# Patient Record
Sex: Female | Born: 1940 | Race: White | Hispanic: No | Marital: Married | Smoking: Never smoker
Health system: Southern US, Community
[De-identification: ages and names within clinical notes are randomized; demographics above are authoritative.]

## PROBLEM LIST (undated history)

## (undated) DIAGNOSIS — E079 Disorder of thyroid, unspecified: Secondary | ICD-10-CM

## (undated) DIAGNOSIS — C50919 Malignant neoplasm of unspecified site of unspecified female breast: Secondary | ICD-10-CM

## (undated) DIAGNOSIS — E119 Type 2 diabetes mellitus without complications: Secondary | ICD-10-CM

## (undated) DIAGNOSIS — I10 Essential (primary) hypertension: Secondary | ICD-10-CM

## (undated) DIAGNOSIS — M810 Age-related osteoporosis without current pathological fracture: Secondary | ICD-10-CM

## (undated) DIAGNOSIS — E039 Hypothyroidism, unspecified: Secondary | ICD-10-CM

## (undated) DIAGNOSIS — E7800 Pure hypercholesterolemia, unspecified: Secondary | ICD-10-CM

## (undated) DIAGNOSIS — C801 Malignant (primary) neoplasm, unspecified: Secondary | ICD-10-CM

## (undated) HISTORY — PX: TONSILLECTOMY: SUR1361

## (undated) HISTORY — DX: Pure hypercholesterolemia, unspecified: E78.00

## (undated) HISTORY — DX: Age-related osteoporosis without current pathological fracture: M81.0

## (undated) HISTORY — DX: Type 2 diabetes mellitus without complications: E11.9

## (undated) HISTORY — PX: TOTAL ABDOMINAL HYSTERECTOMY: SHX209

## (undated) HISTORY — DX: Essential (primary) hypertension: I10

## (undated) HISTORY — DX: Disorder of thyroid, unspecified: E07.9

---

## 2006-09-19 ENCOUNTER — Other Ambulatory Visit: Payer: Self-pay

## 2006-09-19 ENCOUNTER — Emergency Department: Payer: Self-pay | Admitting: Emergency Medicine

## 2007-02-19 ENCOUNTER — Emergency Department: Payer: Self-pay | Admitting: Emergency Medicine

## 2007-02-19 ENCOUNTER — Other Ambulatory Visit: Payer: Self-pay

## 2007-11-05 ENCOUNTER — Emergency Department: Payer: Self-pay | Admitting: Emergency Medicine

## 2007-11-05 ENCOUNTER — Other Ambulatory Visit: Payer: Self-pay

## 2009-02-15 ENCOUNTER — Emergency Department: Payer: Self-pay | Admitting: Emergency Medicine

## 2009-03-11 ENCOUNTER — Emergency Department: Payer: Self-pay | Admitting: Emergency Medicine

## 2009-06-08 ENCOUNTER — Emergency Department: Payer: Self-pay | Admitting: Emergency Medicine

## 2009-11-19 DIAGNOSIS — C801 Malignant (primary) neoplasm, unspecified: Secondary | ICD-10-CM

## 2009-11-19 DIAGNOSIS — C50919 Malignant neoplasm of unspecified site of unspecified female breast: Secondary | ICD-10-CM

## 2009-11-19 HISTORY — PX: MASTECTOMY: SHX3

## 2009-11-19 HISTORY — DX: Malignant neoplasm of unspecified site of unspecified female breast: C50.919

## 2009-11-19 HISTORY — DX: Malignant (primary) neoplasm, unspecified: C80.1

## 2009-11-19 HISTORY — PX: BREAST BIOPSY: SHX20

## 2010-04-04 ENCOUNTER — Ambulatory Visit: Payer: Self-pay | Admitting: Internal Medicine

## 2010-04-19 ENCOUNTER — Ambulatory Visit: Payer: Self-pay | Admitting: Internal Medicine

## 2010-04-26 ENCOUNTER — Ambulatory Visit: Payer: Self-pay | Admitting: Internal Medicine

## 2010-05-01 ENCOUNTER — Ambulatory Visit: Payer: Self-pay | Admitting: Internal Medicine

## 2010-05-19 ENCOUNTER — Ambulatory Visit: Payer: Self-pay | Admitting: Internal Medicine

## 2010-05-25 ENCOUNTER — Ambulatory Visit: Payer: Self-pay | Admitting: Surgery

## 2010-06-01 ENCOUNTER — Inpatient Hospital Stay: Payer: Self-pay | Admitting: Surgery

## 2010-06-30 ENCOUNTER — Ambulatory Visit: Payer: Self-pay | Admitting: Internal Medicine

## 2010-07-09 LAB — CANCER ANTIGEN 27.29: CA 27.29: 15.9 U/mL (ref 0.0–38.6)

## 2010-07-20 ENCOUNTER — Ambulatory Visit: Payer: Self-pay | Admitting: Internal Medicine

## 2010-11-14 ENCOUNTER — Ambulatory Visit: Payer: Self-pay | Admitting: Internal Medicine

## 2010-11-19 ENCOUNTER — Ambulatory Visit: Payer: Self-pay | Admitting: Internal Medicine

## 2011-03-20 ENCOUNTER — Ambulatory Visit: Payer: Self-pay | Admitting: Internal Medicine

## 2011-03-21 LAB — CANCER ANTIGEN 27.29: CA 27.29: 17.8 U/mL (ref 0.0–38.6)

## 2011-04-20 ENCOUNTER — Ambulatory Visit: Payer: Self-pay | Admitting: Internal Medicine

## 2012-01-29 ENCOUNTER — Ambulatory Visit: Payer: Self-pay | Admitting: Oncology

## 2012-01-29 LAB — COMPREHENSIVE METABOLIC PANEL
Albumin: 3.5 g/dL (ref 3.4–5.0)
Anion Gap: 8 (ref 7–16)
BUN: 7 mg/dL (ref 7–18)
Calcium, Total: 8.1 mg/dL — ABNORMAL LOW (ref 8.5–10.1)
Chloride: 96 mmol/L — ABNORMAL LOW (ref 98–107)
Co2: 28 mmol/L (ref 21–32)
Creatinine: 0.77 mg/dL (ref 0.60–1.30)
EGFR (Non-African Amer.): >60
Osmolality: 262 (ref 275–301)
Potassium: 3.8 mmol/L (ref 3.5–5.1)
SGOT(AST): 28 U/L (ref 15–37)
Sodium: 132 mmol/L — ABNORMAL LOW (ref 136–145)

## 2012-01-29 LAB — CBC CANCER CENTER
Basophil #: 0 x10 3/mm (ref 0.0–0.1)
Basophil %: 0.2 %
Eosinophil #: 0 x10 3/mm (ref 0.0–0.7)
Eosinophil %: 0.5 %
HCT: 35.2 % (ref 35.0–47.0)
HGB: 12.1 g/dL (ref 12.0–16.0)
Lymphocyte %: 18.9 %
MCH: 33.6 pg (ref 26.0–34.0)
MCHC: 34.3 g/dL (ref 32.0–36.0)
Monocyte #: 0.5 x10 3/mm (ref 0.0–0.7)
Monocyte %: 8.7 %
Neutrophil #: 4.3 x10 3/mm (ref 1.4–6.5)
RDW: 14.6 % — ABNORMAL HIGH (ref 11.5–14.5)

## 2012-01-30 LAB — CANCER ANTIGEN 27.29: CA 27.29: 19.3 U/mL (ref 0.0–38.6)

## 2012-02-18 ENCOUNTER — Ambulatory Visit: Payer: Self-pay | Admitting: Oncology

## 2012-04-14 ENCOUNTER — Ambulatory Visit: Payer: Self-pay | Admitting: Surgery

## 2012-07-29 ENCOUNTER — Ambulatory Visit: Payer: Self-pay | Admitting: Oncology

## 2012-07-29 LAB — COMPREHENSIVE METABOLIC PANEL
Alkaline Phosphatase: 34 U/L — ABNORMAL LOW (ref 50–136)
Bilirubin,Total: 0.3 mg/dL (ref 0.2–1.0)
Chloride: 101 mmol/L (ref 98–107)
Co2: 27 mmol/L (ref 21–32)
Osmolality: 270 (ref 275–301)
SGOT(AST): 34 U/L (ref 15–37)
SGPT (ALT): 17 U/L (ref 12–78)
Total Protein: 6.3 g/dL — ABNORMAL LOW (ref 6.4–8.2)

## 2012-07-29 LAB — CBC CANCER CENTER
Basophil %: 0.7 %
Eosinophil #: 0.1 x10 3/mm (ref 0.0–0.7)
Eosinophil %: 1 %
Lymphocyte %: 30.1 %
MCH: 33 pg (ref 26.0–34.0)
Monocyte #: 0.7 x10 3/mm (ref 0.2–0.9)
Neutrophil #: 3.5 x10 3/mm (ref 1.4–6.5)
Platelet: 219 x10 3/mm (ref 150–440)
RDW: 13.1 % (ref 11.5–14.5)
WBC: 6.1 x10 3/mm (ref 3.6–11.0)

## 2012-07-30 LAB — CANCER ANTIGEN 27.29: CA 27.29: 16.5 U/mL (ref 0.0–38.6)

## 2012-08-19 ENCOUNTER — Ambulatory Visit: Payer: Self-pay | Admitting: Oncology

## 2013-01-17 ENCOUNTER — Ambulatory Visit: Payer: Self-pay | Admitting: Hematology and Oncology

## 2013-01-27 LAB — COMPREHENSIVE METABOLIC PANEL
Alkaline Phosphatase: 41 U/L — ABNORMAL LOW (ref 50–136)
Anion Gap: 9 (ref 7–16)
BUN: 10 mg/dL (ref 7–18)
Bilirubin,Total: 0.4 mg/dL (ref 0.2–1.0)
Chloride: 97 mmol/L — ABNORMAL LOW (ref 98–107)
Co2: 27 mmol/L (ref 21–32)
Creatinine: 0.91 mg/dL (ref 0.60–1.30)
EGFR (African American): >60
EGFR (Non-African Amer.): >60
Glucose: 47 mg/dL — ABNORMAL LOW (ref 65–99)
Osmolality: 263 (ref 275–301)
Potassium: 4.1 mmol/L (ref 3.5–5.1)
SGPT (ALT): 19 U/L (ref 12–78)

## 2013-01-27 LAB — CBC CANCER CENTER
Basophil %: 0.5 %
Eosinophil #: 0.1 x10 3/mm (ref 0.0–0.7)
Eosinophil %: 1.1 %
HGB: 12.8 g/dL (ref 12.0–16.0)
Lymphocyte #: 1.6 x10 3/mm (ref 1.0–3.6)
Lymphocyte %: 26.2 %
MCH: 33.3 pg (ref 26.0–34.0)
MCV: 98 fL (ref 80–100)
Monocyte %: 10.7 %
Neutrophil #: 3.7 x10 3/mm (ref 1.4–6.5)
Neutrophil %: 61.5 %
Platelet: 192 x10 3/mm (ref 150–440)
RDW: 13.3 % (ref 11.5–14.5)
WBC: 6 x10 3/mm (ref 3.6–11.0)

## 2013-01-28 LAB — CANCER ANTIGEN 27.29: CA 27.29: 18.6 U/mL (ref 0.0–38.6)

## 2013-02-17 ENCOUNTER — Ambulatory Visit: Payer: Self-pay | Admitting: Hematology and Oncology

## 2013-04-14 ENCOUNTER — Ambulatory Visit: Payer: Self-pay | Admitting: Hematology and Oncology

## 2013-04-15 ENCOUNTER — Ambulatory Visit: Payer: Self-pay | Admitting: Hematology and Oncology

## 2013-08-04 ENCOUNTER — Ambulatory Visit: Payer: Self-pay | Admitting: Hematology and Oncology

## 2013-08-05 LAB — COMPREHENSIVE METABOLIC PANEL
Anion Gap: 6 — ABNORMAL LOW (ref 7–16)
Calcium, Total: 9.2 mg/dL (ref 8.5–10.1)
Chloride: 97 mmol/L — ABNORMAL LOW (ref 98–107)
Co2: 29 mmol/L (ref 21–32)
EGFR (African American): >60
Glucose: 150 mg/dL — ABNORMAL HIGH (ref 65–99)
Sodium: 132 mmol/L — ABNORMAL LOW (ref 136–145)
Total Protein: 6.5 g/dL (ref 6.4–8.2)

## 2013-08-05 LAB — CBC CANCER CENTER
Basophil #: 0 x10 3/mm (ref 0.0–0.1)
Eosinophil %: 1.2 %
HCT: 37.8 % (ref 35.0–47.0)
Lymphocyte #: 1.9 x10 3/mm (ref 1.0–3.6)
MCH: 33.5 pg (ref 26.0–34.0)
MCHC: 33.7 g/dL (ref 32.0–36.0)
Monocyte %: 10.5 %
Platelet: 213 x10 3/mm (ref 150–440)
RBC: 3.81 10*6/uL (ref 3.80–5.20)
WBC: 6.7 x10 3/mm (ref 3.6–11.0)

## 2013-08-19 ENCOUNTER — Ambulatory Visit: Payer: Self-pay | Admitting: Hematology and Oncology

## 2013-08-21 ENCOUNTER — Encounter: Payer: Self-pay | Admitting: Podiatry

## 2013-08-24 ENCOUNTER — Encounter: Payer: Self-pay | Admitting: Podiatry

## 2013-08-24 ENCOUNTER — Ambulatory Visit (INDEPENDENT_AMBULATORY_CARE_PROVIDER_SITE_OTHER): Payer: Medicare Other | Admitting: Podiatry

## 2013-08-24 VITALS — BP 114/61 | HR 75 | Temp 98.0°F | Resp 16 | Ht 64.0 in | Wt 109.6 lb

## 2013-08-24 DIAGNOSIS — M79606 Pain in leg, unspecified: Secondary | ICD-10-CM | POA: Insufficient documentation

## 2013-08-24 DIAGNOSIS — B351 Tinea unguium: Secondary | ICD-10-CM

## 2013-08-24 DIAGNOSIS — M79609 Pain in unspecified limb: Secondary | ICD-10-CM

## 2013-08-24 NOTE — Patient Instructions (Signed)

## 2013-08-24 NOTE — Progress Notes (Signed)
She presents today with a chief complaint of painful toenails and calluses bilaterally. I have reviewed her past medical history medications and allergies. Her pulses remain palpable and strong. Her nails are thick yellow dystrophic clinically mycotic. She has thick reactive hyperkeratosis to the plantar aspect of her bilateral foot.  Assessment: Pain in limb secondary to onychomycosis. Painful calluses.  Plan: Debridement all reactive hyperkeratosis and debris all nails 1 through 5 bilateral is a covered service.

## 2013-11-06 ENCOUNTER — Ambulatory Visit: Payer: Self-pay | Admitting: Hematology and Oncology

## 2013-11-06 LAB — CBC CANCER CENTER
Basophil #: 0 x10 3/mm (ref 0.0–0.1)
Basophil %: 0.5 %
Eosinophil #: 0.1 x10 3/mm (ref 0.0–0.7)
Lymphocyte %: 25.4 %
MCHC: 33 g/dL (ref 32.0–36.0)
MCV: 100 fL (ref 80–100)
Neutrophil #: 4.5 x10 3/mm (ref 1.4–6.5)
Platelet: 198 x10 3/mm (ref 150–440)
RBC: 3.48 10*6/uL — ABNORMAL LOW (ref 3.80–5.20)
RDW: 13.1 % (ref 11.5–14.5)
WBC: 7.2 x10 3/mm (ref 3.6–11.0)

## 2013-11-06 LAB — BASIC METABOLIC PANEL
BUN: 9 mg/dL (ref 7–18)
Co2: 30 mmol/L (ref 21–32)
Creatinine: 0.84 mg/dL (ref 0.60–1.30)
EGFR (African American): >60
Osmolality: 268 (ref 275–301)
Potassium: 4.7 mmol/L (ref 3.5–5.1)
Sodium: 133 mmol/L — ABNORMAL LOW (ref 136–145)

## 2013-11-19 ENCOUNTER — Ambulatory Visit: Payer: Self-pay | Admitting: Hematology and Oncology

## 2013-11-23 ENCOUNTER — Encounter: Payer: Self-pay | Admitting: Podiatry

## 2013-11-23 ENCOUNTER — Ambulatory Visit (INDEPENDENT_AMBULATORY_CARE_PROVIDER_SITE_OTHER): Payer: Medicare Other | Admitting: Podiatry

## 2013-11-23 VITALS — BP 126/68 | HR 81 | Resp 14

## 2013-11-23 DIAGNOSIS — M79609 Pain in unspecified limb: Secondary | ICD-10-CM

## 2013-11-23 DIAGNOSIS — B351 Tinea unguium: Secondary | ICD-10-CM

## 2013-11-23 NOTE — Progress Notes (Signed)
   Subjective:    Patient ID: [name] [name], female    DOB: June 16, 1941, 73 y.o.   MRN: [id number]  HPI Comments: " my toenails and calluses "     Review of Systems     Objective:   Physical Exam vital signs are stable she is alert and oriented x3. Pulses are strongly palpable bilateral. Nails are thick yellow dystrophic clinically mycotic bilateral. Reactive hyperkeratosis plantar aspect of the second metatarsophalangeal joint bilateral.        Assessment & Plan:  Assessment: Pain in limb secondary to onychomycosis 1 through 5 bilateral reactive hyperkeratosis secondary to hammertoes bilateral.  Plan: Debridement of nails 1 through 5 bilateral is cover service. Debridement of reactive hyperkeratosis. Followup with her in 3 months.

## 2014-01-21 ENCOUNTER — Ambulatory Visit: Payer: Self-pay | Admitting: Hematology and Oncology

## 2014-01-22 LAB — COMPREHENSIVE METABOLIC PANEL
ALBUMIN: 3.7 g/dL (ref 3.4–5.0)
ALK PHOS: 40 U/L — AB
Anion Gap: 7 (ref 7–16)
BUN: 12 mg/dL (ref 7–18)
Bilirubin,Total: 0.5 mg/dL (ref 0.2–1.0)
CALCIUM: 9.1 mg/dL (ref 8.5–10.1)
CO2: 28 mmol/L (ref 21–32)
Chloride: 95 mmol/L — ABNORMAL LOW (ref 98–107)
Creatinine: 1.04 mg/dL (ref 0.60–1.30)
EGFR (African American): >60
GFR CALC NON AF AMER: 53 — AB
GLUCOSE: 303 mg/dL — AB (ref 65–99)
OSMOLALITY: 272 (ref 275–301)
Potassium: 5.4 mmol/L — ABNORMAL HIGH (ref 3.5–5.1)
SGOT(AST): 23 U/L (ref 15–37)
SGPT (ALT): 14 U/L (ref 12–78)
Sodium: 130 mmol/L — ABNORMAL LOW (ref 136–145)
TOTAL PROTEIN: 6.4 g/dL (ref 6.4–8.2)

## 2014-01-22 LAB — CBC CANCER CENTER
BASOS ABS: 0 x10 3/mm (ref 0.0–0.1)
Basophil %: 0.4 %
EOS PCT: 1 %
Eosinophil #: 0.1 x10 3/mm (ref 0.0–0.7)
HCT: 38.6 % (ref 35.0–47.0)
HGB: 12.6 g/dL (ref 12.0–16.0)
LYMPHS PCT: 25.1 %
Lymphocyte #: 1.6 x10 3/mm (ref 1.0–3.6)
MCH: 32.7 pg (ref 26.0–34.0)
MCHC: 32.8 g/dL (ref 32.0–36.0)
MCV: 100 fL (ref 80–100)
Monocyte #: 0.6 x10 3/mm (ref 0.2–0.9)
Monocyte %: 9.8 %
Neutrophil #: 4.2 x10 3/mm (ref 1.4–6.5)
Neutrophil %: 63.7 %
Platelet: 199 x10 3/mm (ref 150–440)
RBC: 3.87 10*6/uL (ref 3.80–5.20)
RDW: 13.5 % (ref 11.5–14.5)
WBC: 6.6 x10 3/mm (ref 3.6–11.0)

## 2014-02-17 ENCOUNTER — Ambulatory Visit: Payer: Self-pay | Admitting: Hematology and Oncology

## 2014-02-22 ENCOUNTER — Ambulatory Visit: Payer: Medicare Other | Admitting: Podiatry

## 2014-03-01 ENCOUNTER — Ambulatory Visit (INDEPENDENT_AMBULATORY_CARE_PROVIDER_SITE_OTHER): Payer: Medicare Other | Admitting: Podiatry

## 2014-03-01 VITALS — Resp 16 | Ht 64.0 in | Wt 105.0 lb

## 2014-03-01 DIAGNOSIS — B351 Tinea unguium: Secondary | ICD-10-CM

## 2014-03-01 DIAGNOSIS — M79609 Pain in unspecified limb: Secondary | ICD-10-CM

## 2014-03-01 NOTE — Progress Notes (Signed)
She presents today chief complaint of painful toenails one through 5 bilateral.  Objective: Vital signs are stable she is alert and oriented x3. Nails are thick yellow dystrophic with mycotic painful palpation.  Assessment: Pain in limb secondary to onychomycosis 1 through 5 bilateral.  Plan: Debridement nails 1 through 5 bilateral covered service secondary to pain.

## 2014-04-16 ENCOUNTER — Ambulatory Visit: Payer: Self-pay | Admitting: Internal Medicine

## 2014-05-28 ENCOUNTER — Ambulatory Visit: Payer: Self-pay | Admitting: Hematology and Oncology

## 2014-05-28 LAB — COMPREHENSIVE METABOLIC PANEL WITH GFR
Albumin: 3.5 g/dL
Alkaline Phosphatase: 36 U/L — ABNORMAL LOW
Anion Gap: 9
BUN: 9 mg/dL
Bilirubin,Total: 0.4 mg/dL
Calcium, Total: 8.5 mg/dL
Chloride: 97 mmol/L — ABNORMAL LOW
Co2: 27 mmol/L
Creatinine: 0.89 mg/dL
EGFR (African American): >60
EGFR (Non-African Amer.): >60
Glucose: 207 mg/dL — ABNORMAL HIGH
Osmolality: 271
Potassium: 4.3 mmol/L
SGOT(AST): 20 U/L
SGPT (ALT): 14 U/L
Sodium: 133 mmol/L — ABNORMAL LOW
Total Protein: 6.4 g/dL

## 2014-05-28 LAB — CBC CANCER CENTER
Basophil #: 0 "x10 3/mm "
Basophil %: 0.5 %
Eosinophil #: 0.1 "x10 3/mm "
Eosinophil %: 1.4 %
HCT: 36.5 %
HGB: 12.3 g/dL
Lymphocyte %: 28.3 %
Lymphs Abs: 1.9 "x10 3/mm "
MCH: 33.6 pg
MCHC: 33.8 g/dL
MCV: 99 fL
Monocyte #: 0.7 "x10 3/mm "
Monocyte %: 10.4 %
Neutrophil #: 3.9 "x10 3/mm "
Neutrophil %: 59.4 %
Platelet: 197 "x10 3/mm "
RBC: 3.67 "x10 6/mm " — ABNORMAL LOW
RDW: 13.2 %
WBC: 6.6 "x10 3/mm "

## 2014-05-31 ENCOUNTER — Ambulatory Visit: Payer: Medicare Other | Admitting: Podiatry

## 2014-05-31 LAB — CANCER ANTIGEN 27.29: CA 27.29: 10.1 U/mL (ref 0.0–38.6)

## 2014-06-02 ENCOUNTER — Encounter: Payer: Self-pay | Admitting: Podiatry

## 2014-06-02 ENCOUNTER — Ambulatory Visit (INDEPENDENT_AMBULATORY_CARE_PROVIDER_SITE_OTHER): Payer: Medicare Other | Admitting: Podiatry

## 2014-06-02 VITALS — BP 124/64 | HR 92 | Resp 12

## 2014-06-02 DIAGNOSIS — M79609 Pain in unspecified limb: Secondary | ICD-10-CM

## 2014-06-02 DIAGNOSIS — B351 Tinea unguium: Secondary | ICD-10-CM

## 2014-06-02 DIAGNOSIS — Q828 Other specified congenital malformations of skin: Secondary | ICD-10-CM

## 2014-06-02 DIAGNOSIS — E119 Type 2 diabetes mellitus without complications: Secondary | ICD-10-CM

## 2014-06-02 DIAGNOSIS — M79676 Pain in unspecified toe(s): Secondary | ICD-10-CM

## 2014-06-02 NOTE — Progress Notes (Signed)
She presents today chief complaint of painful elongated toenails one through 5 bilateral.  Objective: Pulses are palpable bilateral. Nails are thick yellow dystrophic with mycotic and painful palpation.  Assessment: Pain in limb secondary to onychomycosis 1 through 5 bilateral.  Plan: Debridement of nails 1 through 5 bilateral covered service secondary to pain.

## 2014-06-19 ENCOUNTER — Ambulatory Visit: Payer: Self-pay | Admitting: Hematology and Oncology

## 2014-09-08 ENCOUNTER — Ambulatory Visit: Payer: Medicare Other | Admitting: Podiatry

## 2014-09-27 ENCOUNTER — Ambulatory Visit (INDEPENDENT_AMBULATORY_CARE_PROVIDER_SITE_OTHER): Payer: Medicare Other | Admitting: Podiatry

## 2014-09-27 DIAGNOSIS — Q828 Other specified congenital malformations of skin: Secondary | ICD-10-CM

## 2014-09-27 DIAGNOSIS — B351 Tinea unguium: Secondary | ICD-10-CM

## 2014-09-27 DIAGNOSIS — E119 Type 2 diabetes mellitus without complications: Secondary | ICD-10-CM

## 2014-09-27 DIAGNOSIS — M79676 Pain in unspecified toe(s): Secondary | ICD-10-CM

## 2014-09-27 NOTE — Progress Notes (Signed)
Presents today chief complaint of painful elongated toenails.  Objective: Pulses are palpable bilateral nails are thick, yellow dystrophic onychomycosis and painful palpation. She also has painful porokeratotic lesions dispersed about the plantar aspect of the bilateral foot.  Assessment: Onychomycosis with pain in limb.diabetes mellitus. Porokeratosis bilateral.  Plan: Treatment of nails in thickness and length as covered service secondary to pain.debrided all porokeratotic lesions bilateral.  

## 2014-09-28 ENCOUNTER — Ambulatory Visit: Payer: Self-pay | Admitting: Hematology and Oncology

## 2014-09-28 LAB — COMPREHENSIVE METABOLIC PANEL
ALBUMIN: 3.8 g/dL (ref 3.4–5.0)
ALT: 16 U/L
ANION GAP: 8 (ref 7–16)
Alkaline Phosphatase: 41 U/L — ABNORMAL LOW
BUN: 10 mg/dL (ref 7–18)
Bilirubin,Total: 0.4 mg/dL (ref 0.2–1.0)
CALCIUM: 9 mg/dL (ref 8.5–10.1)
CO2: 29 mmol/L (ref 21–32)
CREATININE: 0.84 mg/dL (ref 0.60–1.30)
Chloride: 96 mmol/L — ABNORMAL LOW (ref 98–107)
EGFR (African American): >60
Glucose: 72 mg/dL (ref 65–99)
OSMOLALITY: 264 (ref 275–301)
Potassium: 4.4 mmol/L (ref 3.5–5.1)
SGOT(AST): 24 U/L (ref 15–37)
SODIUM: 133 mmol/L — AB (ref 136–145)
Total Protein: 6.8 g/dL (ref 6.4–8.2)

## 2014-09-28 LAB — CBC CANCER CENTER
Basophil #: 0 x10 3/mm (ref 0.0–0.1)
Basophil %: 0.5 %
EOS ABS: 0.2 x10 3/mm (ref 0.0–0.7)
Eosinophil %: 2.3 %
HCT: 38.4 % (ref 35.0–47.0)
HGB: 12.7 g/dL (ref 12.0–16.0)
LYMPHS ABS: 1.7 x10 3/mm (ref 1.0–3.6)
Lymphocyte %: 23.6 %
MCH: 33.3 pg (ref 26.0–34.0)
MCHC: 33.2 g/dL (ref 32.0–36.0)
MCV: 100 fL (ref 80–100)
Monocyte #: 0.7 x10 3/mm (ref 0.2–0.9)
Monocyte %: 10.5 %
Neutrophil #: 4.4 x10 3/mm (ref 1.4–6.5)
Neutrophil %: 63.1 %
PLATELETS: 232 x10 3/mm (ref 150–440)
RBC: 3.83 10*6/uL (ref 3.80–5.20)
RDW: 13.3 % (ref 11.5–14.5)
WBC: 7 x10 3/mm (ref 3.6–11.0)

## 2014-09-29 LAB — CANCER ANTIGEN 27.29: CA 27.29: 26.3 U/mL (ref 0.0–38.6)

## 2014-10-19 ENCOUNTER — Ambulatory Visit: Payer: Self-pay | Admitting: Hematology and Oncology

## 2014-12-27 ENCOUNTER — Ambulatory Visit: Payer: Medicare Other | Admitting: Podiatry

## 2014-12-27 ENCOUNTER — Ambulatory Visit (INDEPENDENT_AMBULATORY_CARE_PROVIDER_SITE_OTHER): Payer: Medicare Other | Admitting: Podiatry

## 2014-12-27 DIAGNOSIS — B351 Tinea unguium: Secondary | ICD-10-CM

## 2014-12-27 DIAGNOSIS — Q828 Other specified congenital malformations of skin: Secondary | ICD-10-CM

## 2014-12-27 DIAGNOSIS — M79676 Pain in unspecified toe(s): Secondary | ICD-10-CM

## 2014-12-27 NOTE — Progress Notes (Signed)
Presents today chief complaint of painful elongated toenails.  Objective: Pulses are palpable bilateral nails are thick, yellow dystrophic onychomycosis and painful palpation. She also has painful porokeratotic lesions dispersed about the plantar aspect of the bilateral foot.  Assessment: Onychomycosis with pain in limb.diabetes mellitus. Porokeratosis bilateral.  Plan: Treatment of nails in thickness and length as covered service secondary to pain.debrided all porokeratotic lesions bilateral.

## 2015-03-04 ENCOUNTER — Other Ambulatory Visit: Payer: Self-pay | Admitting: Hematology and Oncology

## 2015-03-04 DIAGNOSIS — Z853 Personal history of malignant neoplasm of breast: Secondary | ICD-10-CM

## 2015-03-28 ENCOUNTER — Ambulatory Visit (INDEPENDENT_AMBULATORY_CARE_PROVIDER_SITE_OTHER): Payer: Medicare Other | Admitting: Podiatry

## 2015-03-28 DIAGNOSIS — M79676 Pain in unspecified toe(s): Secondary | ICD-10-CM

## 2015-03-28 DIAGNOSIS — B351 Tinea unguium: Secondary | ICD-10-CM | POA: Diagnosis not present

## 2015-03-28 DIAGNOSIS — M79606 Pain in leg, unspecified: Secondary | ICD-10-CM

## 2015-03-28 DIAGNOSIS — Q828 Other specified congenital malformations of skin: Secondary | ICD-10-CM

## 2015-03-28 NOTE — Progress Notes (Signed)
Presents today chief complaint of painful elongated toenails.  Objective: Pulses are palpable bilateral nails are thick, yellow dystrophic onychomycosis and painful palpation. She also has painful porokeratotic lesions dispersed about the plantar aspect of the bilateral foot.  Assessment: Onychomycosis 1-5 nails bilateral with pain in limb.. Dabetes mellitus. Porokeratosis bilateral.  Plan: Treatment of nails in thickness and length as covered service secondary to pain.debrided all porokeratotic lesions bilateral.

## 2015-04-21 ENCOUNTER — Other Ambulatory Visit: Payer: Self-pay

## 2015-04-21 ENCOUNTER — Other Ambulatory Visit: Payer: Self-pay | Admitting: Hematology and Oncology

## 2015-04-21 ENCOUNTER — Ambulatory Visit
Admission: RE | Admit: 2015-04-21 | Discharge: 2015-04-21 | Disposition: A | Discharge status: Home / Self Care | Payer: Medicare Other | Source: Ambulatory Visit | Attending: Hematology and Oncology | Admitting: Hematology and Oncology

## 2015-04-21 DIAGNOSIS — Z1231 Encounter for screening mammogram for malignant neoplasm of breast: Secondary | ICD-10-CM | POA: Diagnosis present

## 2015-04-21 DIAGNOSIS — Z853 Personal history of malignant neoplasm of breast: Secondary | ICD-10-CM

## 2015-04-21 DIAGNOSIS — R922 Inconclusive mammogram: Secondary | ICD-10-CM | POA: Insufficient documentation

## 2015-04-21 DIAGNOSIS — C50919 Malignant neoplasm of unspecified site of unspecified female breast: Secondary | ICD-10-CM

## 2015-04-26 ENCOUNTER — Inpatient Hospital Stay: Payer: Medicare Other | Attending: Family Medicine | Admitting: Hematology and Oncology

## 2015-04-26 ENCOUNTER — Inpatient Hospital Stay: Payer: Medicare Other

## 2015-04-26 ENCOUNTER — Other Ambulatory Visit: Payer: Self-pay

## 2015-04-26 ENCOUNTER — Encounter: Payer: Self-pay | Admitting: Hematology and Oncology

## 2015-04-26 ENCOUNTER — Ambulatory Visit: Payer: Self-pay | Admitting: Family Medicine

## 2015-04-26 VITALS — BP 134/74 | HR 74 | Temp 96.4°F | Ht 64.0 in | Wt 106.3 lb

## 2015-04-26 DIAGNOSIS — I10 Essential (primary) hypertension: Secondary | ICD-10-CM | POA: Diagnosis not present

## 2015-04-26 DIAGNOSIS — C50911 Malignant neoplasm of unspecified site of right female breast: Secondary | ICD-10-CM

## 2015-04-26 DIAGNOSIS — E119 Type 2 diabetes mellitus without complications: Secondary | ICD-10-CM

## 2015-04-26 DIAGNOSIS — Z794 Long term (current) use of insulin: Secondary | ICD-10-CM

## 2015-04-26 DIAGNOSIS — E079 Disorder of thyroid, unspecified: Secondary | ICD-10-CM | POA: Diagnosis not present

## 2015-04-26 DIAGNOSIS — E780 Pure hypercholesterolemia: Secondary | ICD-10-CM | POA: Insufficient documentation

## 2015-04-26 DIAGNOSIS — Z79810 Long term (current) use of selective estrogen receptor modulators (SERMs): Secondary | ICD-10-CM | POA: Insufficient documentation

## 2015-04-26 DIAGNOSIS — Z79899 Other long term (current) drug therapy: Secondary | ICD-10-CM

## 2015-04-26 DIAGNOSIS — M818 Other osteoporosis without current pathological fracture: Secondary | ICD-10-CM

## 2015-04-26 DIAGNOSIS — Z170 Estrogen receptor positive status [ER+]: Secondary | ICD-10-CM | POA: Insufficient documentation

## 2015-04-26 DIAGNOSIS — C50919 Malignant neoplasm of unspecified site of unspecified female breast: Secondary | ICD-10-CM

## 2015-04-26 LAB — COMPREHENSIVE METABOLIC PANEL
ALT: 11 U/L — ABNORMAL LOW (ref 14–54)
AST: 23 U/L (ref 15–41)
Albumin: 3.7 g/dL (ref 3.5–5.0)
Alkaline Phosphatase: 32 U/L — ABNORMAL LOW (ref 38–126)
Anion gap: 10 (ref 5–15)
BUN: 11 mg/dL (ref 6–20)
CO2: 25 mmol/L (ref 22–32)
Calcium: 8.6 mg/dL — ABNORMAL LOW (ref 8.9–10.3)
Chloride: 100 mmol/L — ABNORMAL LOW (ref 101–111)
Creatinine, Ser: 0.9 mg/dL (ref 0.44–1.00)
GFR calc Af Amer: >60 mL/min (ref >60)
GFR calc non Af Amer: >60 mL/min (ref >60)
Glucose, Bld: 138 mg/dL — ABNORMAL HIGH (ref 65–99)
Potassium: 4.1 mmol/L (ref 3.5–5.1)
Sodium: 135 mmol/L (ref 135–145)
Total Bilirubin: 0.5 mg/dL (ref 0.3–1.2)
Total Protein: 5.8 g/dL — ABNORMAL LOW (ref 6.5–8.1)

## 2015-04-26 LAB — CBC
HCT: 35.4 % (ref 35.0–47.0)
Hemoglobin: 11.6 g/dL — ABNORMAL LOW (ref 12.0–16.0)
MCH: 32.4 pg (ref 26.0–34.0)
MCHC: 32.8 g/dL (ref 32.0–36.0)
MCV: 98.9 fL (ref 80.0–100.0)
Platelets: 213 10*3/uL (ref 150–440)
RBC: 3.58 MIL/uL — ABNORMAL LOW (ref 3.80–5.20)
RDW: 13.6 % (ref 11.5–14.5)
WBC: 6.6 10*3/uL (ref 3.6–11.0)

## 2015-04-26 NOTE — Progress Notes (Signed)
Pt here today for follow up regarding breast cancer; offers no complaints

## 2015-04-26 NOTE — Progress Notes (Signed)
[facility] day:  04/26/2015  Chief Complaint: [name] [name] is an 74 y.o. female with stage IIA right breast cancer who is seen for reassessment.  HPI: The patient is noted to have an abnormality in 2011. Imaging revealed a 2.5 cm mass in the retroareolar area. She underwent excisional biopsy on 04/12/2010. 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 There was high-grade DCIS with microcalcifications. Tumor was ER greater than 90%, PR less than 1%, and HER-2/neu 1+.  She underwent right breast completion mastectomy with axillary lymph node dissection on 06/01/2010. Pathology revealed a grade III 2.2 cm invasive ductal carcinoma.  Eight sentinel lymph nodes were negative for malignancy.  Pathologic stage was T2N0M0.  Oncotype DX testing on 06/19/2010 revealed a recurrent score of 31.  She did not receive chemotherapy or radiation. She was begun on tamoxifen 20 mg a day on 07/07/2010.  She notes osteoporosis. She takes calcium and vitamin D.  She was previously on alendronate but stopped on 01/28/2014.  Left-sided mammogram on 04/21/2015 revealed no evidence of malignancy.  Symptomatically, she states that she feels fine. Her energy level is okay. He is walking and enjoying life. Weight is stable. She denies any pain.  She denies any breast concerns.  Past Medical History  Diagnosis Date  . diabetes insulin dep   . Hypertension   . High cholesterol   . Osteoporosis   . Thyroid disease     Past Surgical History  Procedure Laterality Date  . Tonsillectomy    . Total abdominal hysterectomy    . Breast biopsy Right 2011    positive  . Mastectomy Right 2011    positive    Family History  Problem Relation Age of Onset  . Heart disease Father     heart attack    Social History:  reports that she has never smoked. She has never used smokeless tobacco. She reports that she does not drink alcohol or use illicit drugs.   The patient is accompanied by her husband, [name] [name].  Allergies:  Allergies  Allergen Reactions  . Sulfa Antibiotics   . Lisinopril Rash    Hyponatremia     Current Medications: Current Outpatient Prescriptions  Medication Sig Dispense Refill  . alendronate (FOSAMAX) 70 MG tablet     . amLODipine (NORVASC) 2.5 MG tablet Take 2.5 mg by mouth daily.    . enalapril (VASOTEC) 20 MG tablet Take 20 mg by mouth daily.    . insulin glargine (LANTUS) 100 UNIT/ML injection Inject 8 Units into the skin at bedtime.    . Insulin Lispro, Human, (HUMALOG [place]) Inject into the skin. SLIDING SCALE    . levothyroxine (SYNTHROID, LEVOTHROID) 25 MCG tablet Take 25 mcg by mouth daily before breakfast.    . lovastatin (MEVACOR) 20 MG tablet Take 20 mg by mouth at bedtime.    . calcium-vitamin D (OSCAL-500) 500-400 MG-UNIT per tablet Take by mouth.    . tamoxifen (NOLVADEX) 20 MG tablet TAKE ONE TABLET ONCE DAILY 90 tablet 2   No current facility-administered medications for this visit.    Review of Systems:  GENERAL:  Feels fine.  No fevers, sweats or weight loss. PERFORMANCE STATUS (ECOG):  0 HEENT:  No visual changes, runny nose, sore throat, mouth sores or tenderness. Lungs: No shortness of breath or cough.  No hemoptysis. Cardiac:  No chest pain, palpitations, orthopnea, or PND. GI:  No nausea, vomiting, diarrhea, constipation, melena or hematochezia. GU:  No  urgency, frequency, dysuria, or hematuria. Musculoskeletal:  No back pain.  No joint pain.  No muscle tenderness. Extremities:  No pain or swelling. Skin:  No rashes or skin changes. Neuro:  No headache, numbness or weakness, balance or coordination issues. Endocrine:  Diabetes.  Thyroid disease on Synthroid.  No hot flashes or night sweats. Psych:  No mood changes, depression or anxiety. Pain:  No focal pain. Review of systems:  All other systems reviewed and found to be negative.   Physical Exam: Blood pressure 134/74, pulse 74,  temperature 96.4 F (35.8 C), temperature source Tympanic, height _0  (1.626 m), weight 106 lb 4.2 oz (48.2 kg). GENERAL:  Well developed, well nourished, sitting comfortably in the exam room in no acute distress. MENTAL STATUS:  Alert and oriented to person, place and time. HEAD:  Short gray hair.  Normocephalic, atraumatic, face symmetric, no Cushingoid features. EYES:  Hazel eyes.  Pupils equal round and reactive to light and accomodation.  No conjunctivitis or scleral icterus. ENT:  Oropharynx clear without lesion.  Tongue normal. Mucous membranes moist.  RESPIRATORY:  Clear to auscultation without rales, wheezes or rhonchi. CARDIOVASCULAR:  Regular rate and rhythm without murmur, rub or gallop. BREAST:  Right sided mastectomy with well healed incision.  No erythema or nodularity.  Left breast with fibrocystic changes.  No discete masses, skin changes or nipple discharge. ABDOMEN:  Soft, non-tender, with active bowel sounds, and no hepatosplenomegaly.  No masses. BACK:  No tenderness on percussion of the back or rib cage. SKIN:  No rashes, ulcers or lesions. EXTREMITIES:  Arthritis changes.  No edema, no skin discoloration or tenderness.  No palpable cords. LYMPH NODES: No palpable cervical, supraclavicular, axillary or inguinal adenopathy  NEUROLOGICAL: Unremarkable. PSYCH:  Appropriate.   Appointment on 04/26/2015  Component Date Value Ref Range Status  . WBC 04/26/2015 6.6  3.6 - 11.0 K/uL Final  . RBC 04/26/2015 3.58* 3.80 - 5.20 MIL/uL Final  . Hemoglobin 04/26/2015 11.6* 12.0 - 16.0 g/dL Final  . HCT 04/26/2015 35.4  35.0 - 47.0 % Final  . MCV 04/26/2015 98.9  80.0 - 100.0 fL Final  . MCH 04/26/2015 32.4  26.0 - 34.0 pg Final  . MCHC 04/26/2015 32.8  32.0 - 36.0 g/dL Final  . RDW 04/26/2015 13.6  11.5 - 14.5 % Final  . Platelets 04/26/2015 213  150 - 440 K/uL Final  . Sodium 04/26/2015 135  135 - 145 mmol/L Final  . Potassium 04/26/2015 4.1  3.5 - 5.1 mmol/L Final  .  Chloride 04/26/2015 100* 101 - 111 mmol/L Final  . CO2 04/26/2015 25  22 - 32 mmol/L Final  . Glucose, Bld 04/26/2015 138* 65 - 99 mg/dL Final  . BUN 04/26/2015 11  6 - 20 mg/dL Final  . Creatinine, Ser 04/26/2015 0.90  0.44 - 1.00 mg/dL Final  . Calcium 04/26/2015 8.6* 8.9 - 10.3 mg/dL Final  . Total Protein 04/26/2015 5.8* 6.5 - 8.1 g/dL Final  . Albumin 04/26/2015 3.7  3.5 - 5.0 g/dL Final  . AST 04/26/2015 23  15 - 41 U/L Final  . ALT 04/26/2015 11* 14 - 54 U/L Final  . Alkaline Phosphatase 04/26/2015 32* 38 - 126 U/L Final  . Total Bilirubin 04/26/2015 0.5  0.3 - 1.2 mg/dL Final  . GFR calc non Af Amer 04/26/2015 >60  >60 mL/min Final  . GFR calc Af Amer 04/26/2015 >60  >60 mL/min Final   Comment: (NOTE) The eGFR has been calculated using the  CKD EPI equation. This calculation has not been validated in all clinical situations. eGFR's persistently <60 mL/min signify possible Chronic Kidney Disease.   . Anion gap 04/26/2015 10  5 - 15 Final  . CA 27.29 04/26/2015 19.8  0.0 - 38.6 U/mL Final   Comment: (NOTE) [facility]/ACS methodology Performed At: [facility] [place], [place] [id number] Lindon Romp MD ZB:[id number]     Assessment:  [name] [name] is an 74 y.o. female with a history of stage IIA right breast cancer s/p mastectomy on 06/01/2010.  Pathology revealed a grade III  2.3 cm invasive ductal carcinoma.  Eight sentinel lymph nodes were negative for malignancy.  Pathologic stage was T2N0M0.  Oncotype DX testing on 06/19/2010 revealed a recurrent score of 31.  She did not receive chemotherapy or radiation. She began tamoxifen on 07/07/2010.  She has a history of osteoporosis. She takes calcium and vitamin D.  She was previously on alendronate (stopped on 01/28/2014).  Left-sided mammogram on 04/21/2015 revealed no evidence of malignancy.  Symptomatically, she feels fine.  Exam is unremarkable.  Plan: 1.  Review entire medical history,  diagnosis and management of breast cancer. 2.  Review recent mammogram- done. 3.  Labs today:  CBC with diff, CMP, CA27.29. 4.  Discuss plan for 5 years of tamoxifen. 5.  RTC in 6 months for MD assessment and labs (CBC, CMP, CA27.29)   Lequita Asal, MD  04/26/2015, 2:40 PM

## 2015-04-27 LAB — CANCER ANTIGEN 27.29: CA 27.29: 19.8 U/mL (ref 0.0–38.6)

## 2015-06-06 DIAGNOSIS — E109 Type 1 diabetes mellitus without complications: Secondary | ICD-10-CM | POA: Insufficient documentation

## 2015-06-27 ENCOUNTER — Ambulatory Visit (INDEPENDENT_AMBULATORY_CARE_PROVIDER_SITE_OTHER): Payer: Medicare Other | Admitting: Podiatry

## 2015-06-27 DIAGNOSIS — Q828 Other specified congenital malformations of skin: Secondary | ICD-10-CM | POA: Diagnosis not present

## 2015-06-27 DIAGNOSIS — E119 Type 2 diabetes mellitus without complications: Secondary | ICD-10-CM

## 2015-06-27 DIAGNOSIS — M79676 Pain in unspecified toe(s): Secondary | ICD-10-CM

## 2015-06-27 DIAGNOSIS — B351 Tinea unguium: Secondary | ICD-10-CM

## 2015-06-27 NOTE — Progress Notes (Signed)
Presents today chief complaint of painful elongated toenails.  Objective: Pulses are palpable bilateral nails are thick, yellow dystrophic onychomycosis and painful palpation. She also has painful porokeratotic lesions dispersed about the plantar aspect of the bilateral foot.  Assessment: Onychomycosis 1-5 nails bilateral with pain in limb.. Dabetes mellitus. Porokeratosis bilateral.  Plan: Treatment of nails in thickness and length as covered service secondary to pain.debrided all porokeratotic lesions bilateral. 3 mo.

## 2015-07-15 ENCOUNTER — Other Ambulatory Visit: Payer: Self-pay | Admitting: Family Medicine

## 2015-09-26 ENCOUNTER — Ambulatory Visit: Payer: Medicare Other

## 2015-09-27 ENCOUNTER — Encounter: Payer: Self-pay | Admitting: Sports Medicine

## 2015-09-27 ENCOUNTER — Ambulatory Visit (INDEPENDENT_AMBULATORY_CARE_PROVIDER_SITE_OTHER): Payer: Medicare Other | Admitting: Sports Medicine

## 2015-09-27 VITALS — BP 108/65 | HR 81

## 2015-09-27 DIAGNOSIS — E119 Type 2 diabetes mellitus without complications: Secondary | ICD-10-CM | POA: Diagnosis not present

## 2015-09-27 DIAGNOSIS — M79676 Pain in unspecified toe(s): Secondary | ICD-10-CM

## 2015-09-27 DIAGNOSIS — B351 Tinea unguium: Secondary | ICD-10-CM

## 2015-09-27 DIAGNOSIS — Q828 Other specified congenital malformations of skin: Secondary | ICD-10-CM | POA: Diagnosis not present

## 2015-09-27 NOTE — Progress Notes (Signed)
Patient ID: Nolyn Eilert, female   DOB: Apr 03, 1941, 74 y.o.   MRN: [id number] Subjective: Elmo Shumard is a 74 y.o. female patient with history of type 2 diabetes who presents to office today complaining of painful callus skin and long, painful nails  while ambulating in shoes; unable to trim. Patient states that the glucose reading this morning was 81 mg/dl.  Patient denies any new changes in medication or new problems. Patient denies any new cramping, numbness, burning or tingling in the legs.  Patient Active Problem List   Diagnosis Date Noted  . Dermatophytosis of nail 08/24/2013  . Pain in lower limb 08/24/2013   Current Outpatient Prescriptions on File Prior to Visit  Medication Sig Dispense Refill  . alendronate (FOSAMAX) 70 MG tablet     . amLODipine (NORVASC) 2.5 MG tablet Take 2.5 mg by mouth daily.    . calcium-vitamin D (OSCAL-500) 500-400 MG-UNIT per tablet Take by mouth.    . enalapril (VASOTEC) 20 MG tablet Take 20 mg by mouth daily.    . insulin glargine (LANTUS) 100 UNIT/ML injection Inject 8 Units into the skin at bedtime.    . Insulin Lispro, Human, (HUMALOG [place]) Inject into the skin. SLIDING SCALE    . levothyroxine (SYNTHROID, LEVOTHROID) 25 MCG tablet Take 25 mcg by mouth daily before breakfast.    . lovastatin (MEVACOR) 20 MG tablet Take 20 mg by mouth at bedtime.    . tamoxifen (NOLVADEX) 20 MG tablet TAKE ONE TABLET ONCE DAILY 90 tablet 2   No current facility-administered medications on file prior to visit.   Allergies  Allergen Reactions  . Lisinopril   . Sulfa Antibiotics    Lab: No recent Hemoglobin A1C on fiile  Objective: General: Patient is awake, alert, and oriented x 3 and in no acute distress.  Integument: Skin is warm, dry and supple bilateral. Nails are tender, long, thickened and  dystrophic with subungual debris, consistent with onychomycosis, 1-5 bilateral. Keratotic lesions with a central nucleated core noted sub 4 and plantar heel on left and sub 2  and sub 5 on right with no signs of infection. Remaining integument unremarkable.  Vasculature:  Dorsalis Pedis pulse 1/4 bilateral. Posterior Tibial pulse  1/4 bilateral.  Capillary fill time <3 sec 1-5 bilateral. Scant hair growth to the level of the digits. Temperature gradient within normal limits. Mild varicosities present bilateral. No edema present bilateral.   Neurology: The patient has intact sensation measured with a 5.07/10g Semmes Weinstein Monofilament at all pedal sites bilateral . Vibratory sensation diminished bilateral with tuning fork. No Babinski sign present bilateral.   Musculoskeletal: Fat pad atrophy and Mild lesser hammertoes pedal deformities noted bilateral. Muscular strength 5/5 in all lower extremity muscular groups bilateral without pain or limitation on range of motion . No tenderness with calf compression bilateral.  Assessment and Plan: Problem List Items Addressed This Visit      Musculoskeletal and Integument   Dermatophytosis of nail - Primary    Other Visit Diagnoses    Porokeratosis        Pain of toe, unspecified laterality        Diabetes mellitus without complication ([place])          -Examined patient. -Discussed and educated patient on diabetic foot care, especially with  regards to the vascular, neurological and musculoskeletal systems.  -Stressed the importance of good glycemic control and the detriment of not  controlling glucose levels in relation to the foot. -Mechanically debrided all nails 1-5  bilateral using sterile nail nipper and filed with dremel without incident  -Debrided porokeratotic lesions x 4 using sterile chisel blade without incident.  -Recommend good supportive shoes daily. -Answered all patient questions -Patient to return in 3 months for at risk foot care -Patient advised to call the office if any problems or questions arise in the  Meantime.  Landis Martins, DPM

## 2015-10-27 ENCOUNTER — Inpatient Hospital Stay: Payer: Medicare Other | Attending: Hematology and Oncology

## 2015-10-27 ENCOUNTER — Inpatient Hospital Stay (HOSPITAL_BASED_OUTPATIENT_CLINIC_OR_DEPARTMENT_OTHER): Payer: Medicare Other | Admitting: Hematology and Oncology

## 2015-10-27 VITALS — BP 131/70 | HR 90 | Temp 96.8°F | Resp 18 | Ht 64.0 in | Wt 108.0 lb

## 2015-10-27 DIAGNOSIS — Z79810 Long term (current) use of selective estrogen receptor modulators (SERMs): Secondary | ICD-10-CM

## 2015-10-27 DIAGNOSIS — I10 Essential (primary) hypertension: Secondary | ICD-10-CM | POA: Diagnosis not present

## 2015-10-27 DIAGNOSIS — E7800 Pure hypercholesterolemia, unspecified: Secondary | ICD-10-CM | POA: Diagnosis not present

## 2015-10-27 DIAGNOSIS — E079 Disorder of thyroid, unspecified: Secondary | ICD-10-CM

## 2015-10-27 DIAGNOSIS — Z170 Estrogen receptor positive status [ER+]: Secondary | ICD-10-CM | POA: Insufficient documentation

## 2015-10-27 DIAGNOSIS — E119 Type 2 diabetes mellitus without complications: Secondary | ICD-10-CM | POA: Insufficient documentation

## 2015-10-27 DIAGNOSIS — M818 Other osteoporosis without current pathological fracture: Secondary | ICD-10-CM | POA: Insufficient documentation

## 2015-10-27 DIAGNOSIS — Z794 Long term (current) use of insulin: Secondary | ICD-10-CM | POA: Diagnosis not present

## 2015-10-27 DIAGNOSIS — C50911 Malignant neoplasm of unspecified site of right female breast: Secondary | ICD-10-CM

## 2015-10-27 LAB — COMPREHENSIVE METABOLIC PANEL
ALT: 12 U/L — ABNORMAL LOW (ref 14–54)
AST: 27 U/L (ref 15–41)
Albumin: 3.9 g/dL (ref 3.5–5.0)
Alkaline Phosphatase: 29 U/L — ABNORMAL LOW (ref 38–126)
Anion gap: 7 (ref 5–15)
BUN: 10 mg/dL (ref 6–20)
CO2: 27 mmol/L (ref 22–32)
Calcium: 9 mg/dL (ref 8.9–10.3)
Chloride: 99 mmol/L — ABNORMAL LOW (ref 101–111)
Creatinine, Ser: 0.86 mg/dL (ref 0.44–1.00)
GFR calc Af Amer: >60 mL/min (ref >60)
GFR calc non Af Amer: >60 mL/min (ref >60)
Glucose, Bld: 110 mg/dL — ABNORMAL HIGH (ref 65–99)
Potassium: 4.9 mmol/L (ref 3.5–5.1)
Sodium: 133 mmol/L — ABNORMAL LOW (ref 135–145)
Total Bilirubin: 0.4 mg/dL (ref 0.3–1.2)
Total Protein: 6.3 g/dL — ABNORMAL LOW (ref 6.5–8.1)

## 2015-10-27 LAB — CBC WITH DIFFERENTIAL/PLATELET
Basophils Absolute: 0 10*3/uL (ref 0–0.1)
Basophils Relative: 1 %
Eosinophils Absolute: 0.1 10*3/uL (ref 0–0.7)
Eosinophils Relative: 2 %
HCT: 36.7 % (ref 35.0–47.0)
Hemoglobin: 12.3 g/dL (ref 12.0–16.0)
Lymphocytes Relative: 30 %
Lymphs Abs: 1.9 10*3/uL (ref 1.0–3.6)
MCH: 32.8 pg (ref 26.0–34.0)
MCHC: 33.7 g/dL (ref 32.0–36.0)
MCV: 97.4 fL (ref 80.0–100.0)
Monocytes Absolute: 0.6 10*3/uL (ref 0.2–0.9)
Monocytes Relative: 10 %
Neutro Abs: 3.5 10*3/uL (ref 1.4–6.5)
Neutrophils Relative %: 57 %
Platelets: 211 10*3/uL (ref 150–440)
RBC: 3.77 MIL/uL — ABNORMAL LOW (ref 3.80–5.20)
RDW: 13.5 % (ref 11.5–14.5)
WBC: 6.1 10*3/uL (ref 3.6–11.0)

## 2015-10-27 NOTE — Progress Notes (Signed)
[facility] day:  10/27/2015  Chief Complaint: Angeleen Horney is an 74 y.o. female with stage IIA right breast cancer who is seen for 6 month assessment.  HPI: The patient was last seen in the medical oncology clinic on 04/26/2015.  At that time, she was seen for initial assessment by me.  She was doing well.  Exam was unremarkable.  Labs were normal including a CA27.29 (19.8).  Left mammogram on 04/21/2015 revealed no evidence of malignancy.  She was on tamoxifen.  During the interim, she has continued to do well. She states that her biggest issue is diabetes.  Past Medical History  Diagnosis Date  . diabetes insulin dep   . Hypertension   . High cholesterol   . Osteoporosis   . Thyroid disease     Past Surgical History  Procedure Laterality Date  . Tonsillectomy    . Total abdominal hysterectomy    . Breast biopsy Right 2011    positive  . Mastectomy Right 2011    positive    Family History  Problem Relation Age of Onset  . Heart disease Father     heart attack    Social History:  reports that she has never smoked. She has never used smokeless tobacco. She reports that she does not drink alcohol or use illicit drugs.  The patient is accompanied by her husband, [name].  Allergies:  Allergies  Allergen Reactions  . Sulfa Antibiotics   . Lisinopril Rash    Hyponatremia     Current Medications: Current Outpatient Prescriptions  Medication Sig Dispense Refill  . alendronate (FOSAMAX) 70 MG tablet     . amLODipine (NORVASC) 2.5 MG tablet Take 2.5 mg by mouth daily.    . calcium-vitamin D (OSCAL-500) 500-400 MG-UNIT per tablet Take by mouth.    . enalapril (VASOTEC) 20 MG tablet Take 20 mg by mouth daily.    . insulin glargine (LANTUS) 100 UNIT/ML injection Inject 8 Units into the skin at bedtime.    . Insulin Lispro, Human, (HUMALOG [place]) Inject into the skin. SLIDING SCALE    . levothyroxine (SYNTHROID, LEVOTHROID) 25 MCG  tablet Take 25 mcg by mouth daily before breakfast.    . lovastatin (MEVACOR) 20 MG tablet Take 20 mg by mouth at bedtime.    . tamoxifen (NOLVADEX) 20 MG tablet TAKE ONE TABLET ONCE DAILY 90 tablet 2   No current facility-administered medications for this visit.    Review of Systems:  GENERAL:  Doing well.  Active.  No fevers, sweats or weight loss. PERFORMANCE STATUS (ECOG):  0 HEENT:  No visual changes, runny nose, sore throat, mouth sores or tenderness. Lungs: No shortness of breath or cough.  No hemoptysis. Cardiac:  No chest pain, palpitations, orthopnea, or PND. GI:  No nausea, vomiting, diarrhea, constipation, melena or hematochezia. GU:  No urgency, frequency, dysuria, or hematuria. Musculoskeletal:  No back pain.  No joint pain.  No muscle tenderness. Extremities:  No pain or swelling. Skin:  No rashes or skin changes. Neuro:  No headache, numbness or weakness, balance or coordination issues. Endocrine:  Diabetes.  Thyroid disease on Synthroid.  No hot flashes or night sweats. Psych:  No mood changes, depression or anxiety. Pain:  No focal pain. Review of systems:  All other systems reviewed and found to be negative.   Physical Exam: Blood pressure 131/70, pulse 90, temperature 96.8 F (36 C), temperature source Tympanic, resp. rate 18, height 5' 4" (  1.626 m), weight 108 lb 0.4 oz (49 kg). GENERAL:  Well developed, well nourished, sitting comfortably in the exam room in no acute distress. MENTAL STATUS:  Alert and oriented to person, place and time. HEAD:  Short gray hair.  Normocephalic, atraumatic, face symmetric, no Cushingoid features. EYES:  Glasses.  Hazel eyes.  Pupils equal round and reactive to light and accomodation.  No conjunctivitis or scleral icterus. ENT:  Oropharynx clear without lesion.  Tongue normal. Mucous membranes moist.  RESPIRATORY:  Clear to auscultation without rales, wheezes or rhonchi. CARDIOVASCULAR:  Regular rate and rhythm without murmur, rub  or gallop. BREAST:  Right sided mastectomy without erythema or nodularity.  Left breast with fibrocystic changes.  No discrete masses, skin changes or nipple discharge. ABDOMEN:  Soft, non-tender, with active bowel sounds, and no hepatosplenomegaly.  No masses. SKIN:  No rashes, ulcers or lesions. EXTREMITIES: Arthritis changes in hands.  No edema, no skin discoloration or tenderness.  No palpable cords. LYMPH NODES: No palpable cervical, supraclavicular, axillary or inguinal adenopathy  NEUROLOGICAL: Unremarkable. PSYCH:  Appropriate.   Appointment on 10/27/2015  Component Date Value Ref Range Status  . WBC 10/27/2015 6.1  3.6 - 11.0 K/uL Final  . RBC 10/27/2015 3.77* 3.80 - 5.20 MIL/uL Final  . Hemoglobin 10/27/2015 12.3  12.0 - 16.0 g/dL Final  . HCT 10/27/2015 36.7  35.0 - 47.0 % Final  . MCV 10/27/2015 97.4  80.0 - 100.0 fL Final  . MCH 10/27/2015 32.8  26.0 - 34.0 pg Final  . MCHC 10/27/2015 33.7  32.0 - 36.0 g/dL Final  . RDW 10/27/2015 13.5  11.5 - 14.5 % Final  . Platelets 10/27/2015 211  150 - 440 K/uL Final  . Neutrophils Relative % 10/27/2015 57   Final  . Neutro Abs 10/27/2015 3.5  1.4 - 6.5 K/uL Final  . Lymphocytes Relative 10/27/2015 30   Final  . Lymphs Abs 10/27/2015 1.9  1.0 - 3.6 K/uL Final  . Monocytes Relative 10/27/2015 10   Final  . Monocytes Absolute 10/27/2015 0.6  0.2 - 0.9 K/uL Final  . Eosinophils Relative 10/27/2015 2   Final  . Eosinophils Absolute 10/27/2015 0.1  0 - 0.7 K/uL Final  . Basophils Relative 10/27/2015 1   Final  . Basophils Absolute 10/27/2015 0.0  0 - 0.1 K/uL Final  . Sodium 10/27/2015 133* 135 - 145 mmol/L Final  . Potassium 10/27/2015 4.9  3.5 - 5.1 mmol/L Final  . Chloride 10/27/2015 99* 101 - 111 mmol/L Final  . CO2 10/27/2015 27  22 - 32 mmol/L Final  . Glucose, Bld 10/27/2015 110* 65 - 99 mg/dL Final  . BUN 10/27/2015 10  6 - 20 mg/dL Final  . Creatinine, Ser 10/27/2015 0.86  0.44 - 1.00 mg/dL Final  . Calcium 10/27/2015 9.0   8.9 - 10.3 mg/dL Final  . Total Protein 10/27/2015 6.3* 6.5 - 8.1 g/dL Final  . Albumin 10/27/2015 3.9  3.5 - 5.0 g/dL Final  . AST 10/27/2015 27  15 - 41 U/L Final  . ALT 10/27/2015 12* 14 - 54 U/L Final  . Alkaline Phosphatase 10/27/2015 29* 38 - 126 U/L Final  . Total Bilirubin 10/27/2015 0.4  0.3 - 1.2 mg/dL Final  . GFR calc non Af Amer 10/27/2015 >60  >60 mL/min Final  . GFR calc Af Amer 10/27/2015 >60  >60 mL/min Final   Comment: (NOTE) The eGFR has been calculated using the CKD EPI equation. This calculation has not been validated in  all clinical situations. eGFR's persistently <60 mL/min signify possible Chronic Kidney Disease.   . Anion gap 10/27/2015 7  5 - 15 Final    Assessment:  Lanie Schelling is an 74 y.o. female with a history of stage IIA right breast cancer s/p mastectomy on 06/01/2010. 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 Eight sentinel lymph nodes were negative for malignancy. Pathologic stage was T2N0M0.  Oncotype DX testing on 06/19/2010 revealed a recurrent score of 31. She did not receive chemotherapy or radiation. She began tamoxifen on 07/07/2010.  CA27.29 was 18 on 10/27/2015.  She has a history of osteoporosis. She takes calcium and vitamin D. She was previously on alendronate (stopped on 01/28/2014). Left-sided mammogram on 04/21/2015 revealed no evidence of malignancy.  Symptomatically, she feels well.  She has issues with diabetes.  Exam is unremarkable.  Plan: 1.  Labs today:  CBC with diff, CMP, CA27.29. 2.  Schedule left sided mammogram 04/20/2016. 3.  Schedule bone density study. 4.  RTC after bone density study.   Lequita Asal, MD  10/27/2015, 11:08 AM

## 2015-10-27 NOTE — Progress Notes (Signed)
Patient is here for follow-up of breast cancer. Last mammogram was in June 2016. Patient states that she has been doing well and offers no complaints today.

## 2015-10-28 LAB — CANCER ANTIGEN 27.29: CA 27.29: 18 U/mL (ref 0.0–38.6)

## 2015-11-02 ENCOUNTER — Other Ambulatory Visit: Payer: Medicare Other

## 2015-11-02 ENCOUNTER — Ambulatory Visit: Payer: Medicare Other

## 2015-12-28 ENCOUNTER — Encounter: Payer: Self-pay | Admitting: Hematology and Oncology

## 2015-12-30 ENCOUNTER — Encounter: Payer: Self-pay | Admitting: Sports Medicine

## 2015-12-30 ENCOUNTER — Ambulatory Visit (INDEPENDENT_AMBULATORY_CARE_PROVIDER_SITE_OTHER): Payer: Medicare Other | Admitting: Sports Medicine

## 2015-12-30 DIAGNOSIS — E119 Type 2 diabetes mellitus without complications: Secondary | ICD-10-CM | POA: Diagnosis not present

## 2015-12-30 DIAGNOSIS — M79676 Pain in unspecified toe(s): Secondary | ICD-10-CM

## 2015-12-30 DIAGNOSIS — B351 Tinea unguium: Secondary | ICD-10-CM | POA: Diagnosis not present

## 2015-12-30 DIAGNOSIS — Q828 Other specified congenital malformations of skin: Secondary | ICD-10-CM | POA: Diagnosis not present

## 2015-12-30 NOTE — Progress Notes (Signed)
Patient ID: [name] [name], female   DOB: January 11, 1941, 75 y.o.   MRN: [id number]  Subjective: [name] [name] is a 75 y.o. female patient with history of type 2 diabetes who presents to office today complaining of painful callus skin and long, painful nails  while ambulating in shoes; unable to trim. Patient states that the glucose reading this morning was 80 mg/dl.  Patient denies any new changes in medication or new problems. Patient denies any new cramping, numbness, burning or tingling in the legs.  Patient Active Problem List   Diagnosis Date Noted  . Dermatophytosis of nail 08/24/2013  . Pain in lower limb 08/24/2013   Current Outpatient Prescriptions on File Prior to Visit  Medication Sig Dispense Refill  . alendronate (FOSAMAX) 70 MG tablet     . amLODipine (NORVASC) 2.5 MG tablet Take 2.5 mg by mouth daily.    . calcium-vitamin D (OSCAL-500) 500-400 MG-UNIT per tablet Take by mouth.    . enalapril (VASOTEC) 20 MG tablet Take 20 mg by mouth daily.    . insulin glargine (LANTUS) 100 UNIT/ML injection Inject 8 Units into the skin at bedtime.    . Insulin Lispro, Human, (HUMALOG [place]) Inject into the skin. SLIDING SCALE    . levothyroxine (SYNTHROID, LEVOTHROID) 25 MCG tablet Take 25 mcg by mouth daily before breakfast.    . lovastatin (MEVACOR) 20 MG tablet Take 20 mg by mouth at bedtime.    . tamoxifen (NOLVADEX) 20 MG tablet TAKE ONE TABLET ONCE DAILY 90 tablet 2   No current facility-administered medications on file prior to visit.   Allergies  Allergen Reactions  . Sulfa Antibiotics   . Lisinopril Rash    Hyponatremia    Lab: No recent Hemoglobin A1C on fiile  Objective: General: Patient is awake, alert, and oriented x 3 and in no acute distress.  Integument: Skin is warm, dry and supple bilateral. Nails are tender, long, thickened and  dystrophic with subungual debris, consistent with onychomycosis, 1-5 bilateral. Keratotic lesions with a central nucleated core noted sub 4 and  plantar heel on left and sub 2 and sub 5 on right with no signs of infection. Remaining integument unremarkable.  Vasculature:  Dorsalis Pedis pulse 1/4 bilateral. Posterior Tibial pulse  1/4 bilateral.  Capillary fill time <3 sec 1-5 bilateral. Scant hair growth to the level of the digits. Temperature gradient within normal limits. Mild varicosities present bilateral. No edema present bilateral.   Neurology: The patient has intact sensation measured with a 5.07/10g Semmes Weinstein Monofilament at all pedal sites bilateral . Vibratory sensation diminished bilateral with tuning fork. No Babinski sign present bilateral.   Musculoskeletal: Fat pad atrophy and Mild lesser hammertoes and bunion pedal deformities noted bilateral. Muscular strength 5/5 in all lower extremity muscular groups bilateral without pain or limitation on range of motion . No tenderness with calf compression bilateral.  Assessment and Plan: Problem List Items Addressed This Visit      Musculoskeletal and Integument   Dermatophytosis of nail - Primary    Other Visit Diagnoses    Porokeratosis        Pain of toe, unspecified laterality        Diabetes mellitus without complication ([place])          -Examined patient. -Discussed and educated patient on diabetic foot care, especially with  regards to the vascular, neurological and musculoskeletal systems.  -Stressed the importance of good glycemic control and the detriment of not  controlling glucose levels in relation  to the foot. -Gave toe spacer to protect 2nd to on left from irritation -Mechanically debrided all nails 1-5 bilateral using sterile nail nipper and filed with dremel without incident  -Debrided porokeratotic lesions x 4 using sterile chisel blade without incident. Recommend daily skin emollients -Recommend good supportive shoes daily. -Answered all patient questions -Patient to return in 3 months for at risk foot care -Patient advised to call the office if  any problems or questions arise in the meantime.  Landis Martins, DPM

## 2016-02-10 DIAGNOSIS — E10649 Type 1 diabetes mellitus with hypoglycemia without coma: Secondary | ICD-10-CM | POA: Insufficient documentation

## 2016-03-30 ENCOUNTER — Ambulatory Visit (INDEPENDENT_AMBULATORY_CARE_PROVIDER_SITE_OTHER): Payer: Medicare Other | Admitting: Sports Medicine

## 2016-03-30 ENCOUNTER — Encounter: Payer: Self-pay | Admitting: Sports Medicine

## 2016-03-30 DIAGNOSIS — I10 Essential (primary) hypertension: Secondary | ICD-10-CM | POA: Insufficient documentation

## 2016-03-30 DIAGNOSIS — E039 Hypothyroidism, unspecified: Secondary | ICD-10-CM | POA: Insufficient documentation

## 2016-03-30 DIAGNOSIS — M79676 Pain in unspecified toe(s): Secondary | ICD-10-CM

## 2016-03-30 DIAGNOSIS — C50919 Malignant neoplasm of unspecified site of unspecified female breast: Secondary | ICD-10-CM | POA: Insufficient documentation

## 2016-03-30 DIAGNOSIS — E119 Type 2 diabetes mellitus without complications: Secondary | ICD-10-CM

## 2016-03-30 DIAGNOSIS — E785 Hyperlipidemia, unspecified: Secondary | ICD-10-CM | POA: Insufficient documentation

## 2016-03-30 DIAGNOSIS — B351 Tinea unguium: Secondary | ICD-10-CM | POA: Diagnosis not present

## 2016-03-30 DIAGNOSIS — Q828 Other specified congenital malformations of skin: Secondary | ICD-10-CM | POA: Diagnosis not present

## 2016-03-30 NOTE — Progress Notes (Signed)
Patient ID: [name] [name], female   DOB: March 06, 1941, 75 y.o.   MRN: [id number]  Subjective: [name] [name] is a 75 y.o. female patient with history of type 2 diabetes who presents to office today complaining of painful callus skin and long, painful nails  while ambulating in shoes; unable to trim. Patient states that the glucose reading this morning was 80 mg/dl.  Patient denies any new changes in medication or new problems. Patient denies any new cramping, numbness, burning or tingling in the legs.  Patient Active Problem List   Diagnosis Date Noted  . Malignant neoplasm of breast ([place]) 03/30/2016  . HLD (hyperlipidemia) 03/30/2016  . BP (high blood pressure) 03/30/2016  . Adult hypothyroidism 03/30/2016  . Type 1 diabetes mellitus ([place]) 02/10/2016  . Type 1 diabetes mellitus without complication ([place]) [id number]  . Dermatophytosis of nail 08/24/2013  . Pain in lower limb 08/24/2013   Current Outpatient Prescriptions on File Prior to Visit  Medication Sig Dispense Refill  . alendronate (FOSAMAX) 70 MG tablet     . amLODipine (NORVASC) 2.5 MG tablet Take 2.5 mg by mouth daily.    . calcium-vitamin D (OSCAL-500) 500-400 MG-UNIT per tablet Take by mouth.    . enalapril (VASOTEC) 20 MG tablet Take 20 mg by mouth daily.    . insulin glargine (LANTUS) 100 UNIT/ML injection Inject 8 Units into the skin at bedtime.    . Insulin Lispro, Human, (HUMALOG [place]) Inject into the skin. SLIDING SCALE    . levothyroxine (SYNTHROID, LEVOTHROID) 25 MCG tablet Take 25 mcg by mouth daily before breakfast.    . lovastatin (MEVACOR) 20 MG tablet Take 20 mg by mouth at bedtime.    . tamoxifen (NOLVADEX) 20 MG tablet TAKE ONE TABLET ONCE DAILY 90 tablet 2   No current facility-administered medications on file prior to visit.   Allergies  Allergen Reactions  . Sulfa Antibiotics   . Lisinopril Rash    Hyponatremia    Lab: No recent Hemoglobin A1C on fiile  Objective: General: Patient is awake, alert, and oriented  x 3 and in no acute distress.  Integument: Skin is warm, dry and supple bilateral. Nails are tender, long, thickened and  dystrophic with subungual debris, consistent with onychomycosis, 1-5 bilateral. Keratotic lesions with a central nucleated core noted sub 4 and plantar heel on left and sub 2 and sub 5 on right with no signs of infection. Remaining integument unremarkable.  Vasculature:  Dorsalis Pedis pulse 1/4 bilateral. Posterior Tibial pulse  1/4 bilateral.  Capillary fill time <3 sec 1-5 bilateral. Scant hair growth to the level of the digits. Temperature gradient within normal limits. Mild varicosities present bilateral. No edema present bilateral.   Neurology: The patient has intact sensation measured with a 5.07/10g Semmes Weinstein Monofilament at all pedal sites bilateral . Vibratory sensation diminished bilateral with tuning fork. No Babinski sign present bilateral.   Musculoskeletal: Fat pad atrophy and Mild lesser hammertoes and bunion pedal deformities noted bilateral. Muscular strength 5/5 in all lower extremity muscular groups bilateral without pain or limitation on range of motion . No tenderness with calf compression bilateral.  Assessment and Plan: Problem List Items Addressed This Visit      Musculoskeletal and Integument   Dermatophytosis of nail - Primary    Other Visit Diagnoses    Porokeratosis        Pain of toe, unspecified laterality        Diabetes mellitus without complication ([place])          -  Examined patient. -Discussed and educated patient on diabetic foot care, especially with  regards to the vascular, neurological and musculoskeletal systems.  -Stressed the importance of good glycemic control and the detriment of not  controlling glucose levels in relation to the foot. -Cont with toe spacer to protect 2nd to on left from irritation -Mechanically debrided all nails 1-5 bilateral using sterile nail nipper and filed with dremel without incident   -Debrided porokeratotic lesions x 4 using sterile chisel blade without incident. Recommend daily skin emollients -Recommend good supportive shoes daily. -Answered all patient questions -Patient to return in 3 months for at risk foot care -Patient advised to call the office if any problems or questions arise in the meantime.  Landis Martins, DPM

## 2016-04-08 ENCOUNTER — Other Ambulatory Visit: Payer: Self-pay | Admitting: Family Medicine

## 2016-04-23 ENCOUNTER — Ambulatory Visit
Admission: RE | Admit: 2016-04-23 | Discharge: 2016-04-23 | Disposition: A | Discharge status: Home / Self Care | Payer: Medicare Other | Source: Ambulatory Visit | Attending: Hematology and Oncology | Admitting: Hematology and Oncology

## 2016-04-23 ENCOUNTER — Other Ambulatory Visit: Payer: Self-pay | Admitting: Hematology and Oncology

## 2016-04-23 DIAGNOSIS — Z1231 Encounter for screening mammogram for malignant neoplasm of breast: Secondary | ICD-10-CM | POA: Insufficient documentation

## 2016-04-23 DIAGNOSIS — M810 Age-related osteoporosis without current pathological fracture: Secondary | ICD-10-CM | POA: Insufficient documentation

## 2016-04-23 DIAGNOSIS — M816 Localized osteoporosis [Lequesne]: Secondary | ICD-10-CM | POA: Diagnosis not present

## 2016-04-23 DIAGNOSIS — C50911 Malignant neoplasm of unspecified site of right female breast: Secondary | ICD-10-CM | POA: Diagnosis present

## 2016-04-23 DIAGNOSIS — Z79811 Long term (current) use of aromatase inhibitors: Secondary | ICD-10-CM | POA: Diagnosis present

## 2016-04-23 DIAGNOSIS — Z90710 Acquired absence of both cervix and uterus: Secondary | ICD-10-CM | POA: Insufficient documentation

## 2016-04-23 HISTORY — DX: Malignant neoplasm of unspecified site of unspecified female breast: C50.919

## 2016-04-23 HISTORY — DX: Malignant (primary) neoplasm, unspecified: C80.1

## 2016-04-24 ENCOUNTER — Telehealth: Payer: Self-pay | Admitting: *Deleted

## 2016-04-24 NOTE — Telephone Encounter (Signed)
Called the house and spoke to husband and gave him info. But he states she has appt on Thursday and we can talk about it then.  According to med list pt already on alendronate. So i assume she would need to start prolia which I mentioned to husband and pt was outside and could not come to the phone but he said it could wait and talk to her at the appt this week.

## 2016-04-24 NOTE — Telephone Encounter (Signed)
-----   Message from Lequita Asal, MD sent at 04/23/2016 12:44 PM EDT ----- Regarding: Osteoporosis  Severe osteoporosis.  Patient needs Prolia, Reclast, or an oral bisphosphonate!  M ----- Message -----    From: Rad Results In Interface    Sent: 04/23/2016  12:07 PM      To: Lequita Asal, MD

## 2016-04-26 ENCOUNTER — Inpatient Hospital Stay: Payer: Medicare Other | Attending: Hematology and Oncology | Admitting: Hematology and Oncology

## 2016-04-26 ENCOUNTER — Encounter: Payer: Self-pay | Admitting: Hematology and Oncology

## 2016-04-26 ENCOUNTER — Telehealth: Payer: Self-pay | Admitting: *Deleted

## 2016-04-26 VITALS — BP 117/67 | HR 86 | Temp 96.7°F | Wt 106.7 lb

## 2016-04-26 DIAGNOSIS — Z794 Long term (current) use of insulin: Secondary | ICD-10-CM | POA: Diagnosis not present

## 2016-04-26 DIAGNOSIS — Z79899 Other long term (current) drug therapy: Secondary | ICD-10-CM | POA: Insufficient documentation

## 2016-04-26 DIAGNOSIS — Z170 Estrogen receptor positive status [ER+]: Secondary | ICD-10-CM | POA: Diagnosis not present

## 2016-04-26 DIAGNOSIS — E079 Disorder of thyroid, unspecified: Secondary | ICD-10-CM | POA: Diagnosis not present

## 2016-04-26 DIAGNOSIS — E7800 Pure hypercholesterolemia, unspecified: Secondary | ICD-10-CM | POA: Diagnosis not present

## 2016-04-26 DIAGNOSIS — Z79810 Long term (current) use of selective estrogen receptor modulators (SERMs): Secondary | ICD-10-CM | POA: Insufficient documentation

## 2016-04-26 DIAGNOSIS — M818 Other osteoporosis without current pathological fracture: Secondary | ICD-10-CM | POA: Insufficient documentation

## 2016-04-26 DIAGNOSIS — C50911 Malignant neoplasm of unspecified site of right female breast: Secondary | ICD-10-CM

## 2016-04-26 DIAGNOSIS — E119 Type 2 diabetes mellitus without complications: Secondary | ICD-10-CM | POA: Insufficient documentation

## 2016-04-26 DIAGNOSIS — Z9011 Acquired absence of right breast and nipple: Secondary | ICD-10-CM

## 2016-04-26 DIAGNOSIS — M810 Age-related osteoporosis without current pathological fracture: Secondary | ICD-10-CM

## 2016-04-26 NOTE — Telephone Encounter (Signed)
Called to report that she has not been taking alendronate since 3/ 2015. She also has not been taking Levothyroxine either. I advised he call D rHande's office to let them know she has not been taking Levothyroxine and that I would let D rCirciran know she is not taking alendronate

## 2016-04-26 NOTE — Patient Instructions (Signed)
Denosumab injection  What is this medicine?  DENOSUMAB (den oh sue mab) slows bone breakdown. Prolia is used to treat osteoporosis in women after menopause and in men. Xgeva is used to prevent bone fractures and other bone problems caused by cancer bone metastases. Xgeva is also used to treat giant cell tumor of the bone.  This medicine may be used for other purposes; ask your health care provider or pharmacist if you have questions.  What should I tell my health care provider before I take this medicine?  They need to know if you have any of these conditions:  -dental disease  -eczema  -infection or history of infections  -kidney disease or on dialysis  -low blood calcium or vitamin D  -malabsorption syndrome  -scheduled to have surgery or tooth extraction  -taking medicine that contains denosumab  -thyroid or parathyroid disease  -an unusual reaction to denosumab, other medicines, foods, dyes, or preservatives  -pregnant or trying to get pregnant  -breast-feeding  How should I use this medicine?  This medicine is for injection under the skin. It is given by a health care professional in a hospital or clinic setting.  If you are getting Prolia, a special MedGuide will be given to you by the pharmacist with each prescription and refill. Be sure to read this information carefully each time.  For Prolia, talk to your pediatrician regarding the use of this medicine in children. Special care may be needed. For Xgeva, talk to your pediatrician regarding the use of this medicine in children. While this drug may be prescribed for children as young as 13 years for selected conditions, precautions do apply.  Overdosage: If you think you have taken too much of this medicine contact a poison control center or emergency room at once.  NOTE: This medicine is only for you. Do not share this medicine with others.  What if I miss a dose?  It is important not to miss your dose. Call your doctor or health care professional if you are  unable to keep an appointment.  What may interact with this medicine?  Do not take this medicine with any of the following medications:  -other medicines containing denosumab  This medicine may also interact with the following medications:  -medicines that suppress the immune system  -medicines that treat cancer  -steroid medicines like prednisone or cortisone  This list may not describe all possible interactions. Give your health care provider a list of all the medicines, herbs, non-prescription drugs, or dietary supplements you use. Also tell them if you smoke, drink alcohol, or use illegal drugs. Some items may interact with your medicine.  What should I watch for while using this medicine?  Visit your doctor or health care professional for regular checks on your progress. Your doctor or health care professional may order blood tests and other tests to see how you are doing.  Call your doctor or health care professional if you get a cold or other infection while receiving this medicine. Do not treat yourself. This medicine may decrease your body's ability to fight infection.  You should make sure you get enough calcium and vitamin D while you are taking this medicine, unless your doctor tells you not to. Discuss the foods you eat and the vitamins you take with your health care professional.  See your dentist regularly. Brush and floss your teeth as directed. Before you have any dental work done, tell your dentist you are receiving this medicine.  Do   not become pregnant while taking this medicine or for 5 months after stopping it. Women should inform their doctor if they wish to become pregnant or think they might be pregnant. There is a potential for serious side effects to an unborn child. Talk to your health care professional or pharmacist for more information.  What side effects may I notice from receiving this medicine?  Side effects that you should report to your doctor or health care professional as soon as  possible:  -allergic reactions like skin rash, itching or hives, swelling of the face, lips, or tongue  -breathing problems  -chest pain  -fast, irregular heartbeat  -feeling faint or lightheaded, falls  -fever, chills, or any other sign of infection  -muscle spasms, tightening, or twitches  -numbness or tingling  -skin blisters or bumps, or is dry, peels, or red  -slow healing or unexplained pain in the mouth or jaw  -unusual bleeding or bruising  Side effects that usually do not require medical attention (Report these to your doctor or health care professional if they continue or are bothersome.):  -muscle pain  -stomach upset, gas  This list may not describe all possible side effects. Call your doctor for medical advice about side effects. You may report side effects to FDA at 1-800-FDA-1088.  Where should I keep my medicine?  This medicine is only given in a clinic, doctor's office, or other health care setting and will not be stored at home.  NOTE: This sheet is a summary. It may not cover all possible information. If you have questions about this medicine, talk to your doctor, pharmacist, or health care provider.      2016, Elsevier/Gold Standard. (2012-05-05 12:37:47)

## 2016-04-26 NOTE — Progress Notes (Signed)
[facility]-  Cancer Center  Clinic day:  04/26/2016  Chief Complaint: [name] [name] is an 75 y.o. female with stage IIA right breast cancer who is seen for 6 month assessment.  HPI: The patient was last seen in the medical oncology clinic on 10/27/2015.  At that time, she was doing well.  Exam was unremarkable.  CA27.29 was 18.  Left sided mammogram on 04/23/2016 revealed no evidence of malignancy.  Bone density study on 04/23/2016 revealed osteoporosis with a T-score of -4.5 in L1-L3, and -3.2 in the right femur.  Symptomatically, she denies any concerns.  She had dental work < 6 months ago.   Past Medical History  Diagnosis Date  . diabetes insulin dep   . Hypertension   . High cholesterol   . Osteoporosis   . Thyroid disease   . Cancer (HCC) 2011    BREAST CA  . Breast cancer ([facility]CC) 2011    RT MASTECTOMY    Past Surgical History  Procedure Laterality Date  . Tonsillectomy    . Total abdominal hysterectomy    . Breast biopsy Right 2011    positive  . Mastectomy Right 2011    positive    Family History  Problem Relation Age of Onset  . Heart disease Father     heart attack  . Breast cancer Neg Hx     Social History:  reports that she has never smoked. She has never used smokeless tobacco. She reports that she does not drink alcohol or use illicit drugs.  The patient is accompanied by her husband, Berna Spare.  Allergies:  Allergies  Allergen Reactions  . Sulfa Antibiotics   . Lisinopril Rash    Hyponatremia     Current Medications: Current Outpatient Prescriptions  Medication Sig Dispense Refill  . alendronate (FOSAMAX) 70 MG tablet     . amLODipine (NORVASC) 2.5 MG tablet Take 2.5 mg by mouth daily.    . calcium-vitamin D (OSCAL-500) 500-400 MG-UNIT per tablet Take by mouth.    . enalapril (VASOTEC) 20 MG tablet Take 20 mg by mouth daily.    . insulin glargine (LANTUS) 100 UNIT/ML injection Inject 8 Units into the skin at bedtime.    .  insulin lispro (HUMALOG) 100 UNIT/ML injection     . levothyroxine (SYNTHROID, LEVOTHROID) 25 MCG tablet Take 25 mcg by mouth daily before breakfast.    . lovastatin (MEVACOR) 20 MG tablet Take 20 mg by mouth at bedtime.    . tamoxifen (NOLVADEX) 20 MG tablet TAKE ONE TABLET ONCE DAILY 90 tablet 1   No current facility-administered medications for this visit.    Review of Systems:  GENERAL:  Doing well.  No fevers or sweats.  Weight loss of 2 pounds. PERFORMANCE STATUS (ECOG):  0 HEENT:  No visual changes, runny nose, sore throat, mouth sores or tenderness. Lungs: No shortness of breath or cough.  No hemoptysis. Cardiac:  No chest pain, palpitations, orthopnea, or PND. GI:  No nausea, vomiting, diarrhea, constipation, melena or hematochezia. GU:  No urgency, frequency, dysuria, or hematuria. Musculoskeletal:  No back pain.  No joint pain.  No muscle tenderness. Extremities:  No pain or swelling. Skin:  No rashes or skin changes. Neuro:  No headache, numbness or weakness, balance or coordination issues. Endocrine:  Diabetes.  Thyroid disease on Synthroid.  No hot flashes or night sweats. Psych:  No mood changes, depression or anxiety. Pain:  No focal pain. Review of systems:  All other systems reviewed  and found to be negative.   Physical Exam: Blood pressure 117/67, pulse 86, temperature 96.7 F (35.9 C), temperature source Tympanic, weight 106 lb 11.2 oz (48.4 kg). GENERAL:  Well developed, well nourished, sitting comfortably in the exam room in no acute distress. MENTAL STATUS:  Alert and oriented to person, place and time. HEAD:  Short gray hair.  Normocephalic, atraumatic, face symmetric, no Cushingoid features. EYES:  Glasses.  Hazel eyes.  Pupils equal round and reactive to light and accomodation.  No conjunctivitis or scleral icterus. ENT:  Oropharynx clear without lesion.  Tongue normal. Mucous membranes moist.  RESPIRATORY:  Clear to auscultation without rales, wheezes or  rhonchi. CARDIOVASCULAR:  Regular rate and rhythm without murmur, rub or gallop. BREAST:  Right sided mastectomy without erythema or nodularity.  Left breast with fibrocystic changes (stable).  No discrete masses, skin changes or nipple discharge. ABDOMEN:  Soft, non-tender, with active bowel sounds, and no hepatosplenomegaly.  No masses. SKIN:  No rashes, ulcers or lesions. EXTREMITIES: Arthritis changes in hands.  No edema, no skin discoloration or tenderness.  No palpable cords. LYMPH NODES: No palpable cervical, supraclavicular, axillary or inguinal adenopathy  NEUROLOGICAL: Unremarkable. PSYCH:  Appropriate.   No visits with results within 3 Day(s) from this visit. Latest known visit with results is:  Appointment on 10/27/2015  Component Date Value Ref Range Status  . WBC 10/27/2015 6.1  3.6 - 11.0 K/uL Final  . RBC 10/27/2015 3.77* 3.80 - 5.20 MIL/uL Final  . Hemoglobin 10/27/2015 12.3  12.0 - 16.0 g/dL Final  . HCT 10/27/2015 36.7  35.0 - 47.0 % Final  . MCV 10/27/2015 97.4  80.0 - 100.0 fL Final  . MCH 10/27/2015 32.8  26.0 - 34.0 pg Final  . MCHC 10/27/2015 33.7  32.0 - 36.0 g/dL Final  . RDW 10/27/2015 13.5  11.5 - 14.5 % Final  . Platelets 10/27/2015 211  150 - 440 K/uL Final  . Neutrophils Relative % 10/27/2015 57   Final  . Neutro Abs 10/27/2015 3.5  1.4 - 6.5 K/uL Final  . Lymphocytes Relative 10/27/2015 30   Final  . Lymphs Abs 10/27/2015 1.9  1.0 - 3.6 K/uL Final  . Monocytes Relative 10/27/2015 10   Final  . Monocytes Absolute 10/27/2015 0.6  0.2 - 0.9 K/uL Final  . Eosinophils Relative 10/27/2015 2   Final  . Eosinophils Absolute 10/27/2015 0.1  0 - 0.7 K/uL Final  . Basophils Relative 10/27/2015 1   Final  . Basophils Absolute 10/27/2015 0.0  0 - 0.1 K/uL Final  . Sodium 10/27/2015 133* 135 - 145 mmol/L Final  . Potassium 10/27/2015 4.9  3.5 - 5.1 mmol/L Final  . Chloride 10/27/2015 99* 101 - 111 mmol/L Final  . CO2 10/27/2015 27  22 - 32 mmol/L Final  .  Glucose, Bld 10/27/2015 110* 65 - 99 mg/dL Final  . BUN 10/27/2015 10  6 - 20 mg/dL Final  . Creatinine, Ser 10/27/2015 0.86  0.44 - 1.00 mg/dL Final  . Calcium 10/27/2015 9.0  8.9 - 10.3 mg/dL Final  . Total Protein 10/27/2015 6.3* 6.5 - 8.1 g/dL Final  . Albumin 10/27/2015 3.9  3.5 - 5.0 g/dL Final  . AST 10/27/2015 27  15 - 41 U/L Final  . ALT 10/27/2015 12* 14 - 54 U/L Final  . Alkaline Phosphatase 10/27/2015 29* 38 - 126 U/L Final  . Total Bilirubin 10/27/2015 0.4  0.3 - 1.2 mg/dL Final  . GFR calc non Af Amer 10/27/2015 >  60  >60 mL/min Final  . GFR calc Af Amer 10/27/2015 >60  >60 mL/min Final   Comment: (NOTE) The eGFR has been calculated using the CKD EPI equation. This calculation has not been validated in all clinical situations. eGFR's persistently <60 mL/min signify possible Chronic Kidney Disease.   . Anion gap 10/27/2015 7  5 - 15 Final  . CA 27.29 10/27/2015 18.0  0.0 - 38.6 U/mL Final   Comment: (NOTE) [employer] Cent[employer]/ACS methodology Performed At: [facility] [address] [place], [place] [id number] Lindon Romp MD RA:[id number]     Assessment:  [name] [name] is an 75 y.o. female with a history of stage IIA right breast cancer s/p mastectomy on 06/01/2010. 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 Eight sentinel lymph nodes were negative for malignancy. Pathologic stage was T2N0M0.  Oncotype DX testing on 06/19/2010 revealed a recurrent score of 31. She did not receive chemotherapy or radiation. She began tamoxifen on 07/07/2010.  CA27.29 was 18 on 10/27/2015.  Left-sided mammogram on 04/23/2016 revealed no evidence of malignancy.  Bone density study on 04/23/2016 revealed osteoporosis with a T-score of -4.5 in L1-L3, and -3.2 in the right femur.  She is on calcium and vitamin D. She was previously on alendronate (unsure if taking; prior notes indicated stopped on 01/28/2014; on current medication list).   Symptomatically,  she feels well.  Exam is unremarkable.  Plan: 1.  Review left sided mammogram and bone and density study. 2.  Discuss concern for potential fracture.  Review importance of calcium and vitamin D.  Patient to confirm if talking a bisphosphonate.  Discuss Prolia every 6 months. 3.  Preauth Prolia. 4.  RTC in 1 week for labs (CBC with diff, CMP, CA27.29) and Prolia. 5.  RTC in 1 month for MD assess and labs (BMP).   Lequita Asal, MD  04/26/2016, 10:41 AM

## 2016-05-01 ENCOUNTER — Other Ambulatory Visit: Payer: Self-pay

## 2016-05-01 DIAGNOSIS — C50911 Malignant neoplasm of unspecified site of right female breast: Secondary | ICD-10-CM

## 2016-05-03 ENCOUNTER — Inpatient Hospital Stay: Payer: Medicare Other

## 2016-05-03 VITALS — BP 112/51 | HR 81 | Temp 98.1°F | Resp 18

## 2016-05-03 DIAGNOSIS — C50911 Malignant neoplasm of unspecified site of right female breast: Secondary | ICD-10-CM | POA: Diagnosis not present

## 2016-05-03 DIAGNOSIS — M810 Age-related osteoporosis without current pathological fracture: Secondary | ICD-10-CM

## 2016-05-03 LAB — CBC WITH DIFFERENTIAL/PLATELET
Basophils Absolute: 0 10*3/uL (ref 0–0.1)
Basophils Relative: 1 %
Eosinophils Absolute: 0.1 10*3/uL (ref 0–0.7)
Eosinophils Relative: 1 %
HCT: 36.9 % (ref 35.0–47.0)
Hemoglobin: 12.7 g/dL (ref 12.0–16.0)
Lymphocytes Relative: 26 %
Lymphs Abs: 1.9 10*3/uL (ref 1.0–3.6)
MCH: 33.5 pg (ref 26.0–34.0)
MCHC: 34.4 g/dL (ref 32.0–36.0)
MCV: 97.4 fL (ref 80.0–100.0)
Monocytes Absolute: 0.6 10*3/uL (ref 0.2–0.9)
Monocytes Relative: 9 %
Neutro Abs: 4.6 10*3/uL (ref 1.4–6.5)
Neutrophils Relative %: 63 %
Platelets: 219 10*3/uL (ref 150–440)
RBC: 3.79 MIL/uL — ABNORMAL LOW (ref 3.80–5.20)
RDW: 13.4 % (ref 11.5–14.5)
WBC: 7.3 10*3/uL (ref 3.6–11.0)

## 2016-05-03 LAB — COMPREHENSIVE METABOLIC PANEL
ALT: 12 U/L — ABNORMAL LOW (ref 14–54)
AST: 26 U/L (ref 15–41)
Albumin: 4 g/dL (ref 3.5–5.0)
Alkaline Phosphatase: 24 U/L — ABNORMAL LOW (ref 38–126)
Anion gap: 9 (ref 5–15)
BUN: 14 mg/dL (ref 6–20)
CO2: 25 mmol/L (ref 22–32)
Calcium: 8.9 mg/dL (ref 8.9–10.3)
Chloride: 101 mmol/L (ref 101–111)
Creatinine, Ser: 0.93 mg/dL (ref 0.44–1.00)
GFR calc Af Amer: >60 mL/min (ref >60)
GFR calc non Af Amer: 58 mL/min — ABNORMAL LOW (ref >60)
Glucose, Bld: 73 mg/dL (ref 65–99)
Potassium: 4 mmol/L (ref 3.5–5.1)
Sodium: 135 mmol/L (ref 135–145)
Total Bilirubin: 0.6 mg/dL (ref 0.3–1.2)
Total Protein: 6.2 g/dL — ABNORMAL LOW (ref 6.5–8.1)

## 2016-05-03 MED ORDER — DENOSUMAB 60 MG/ML ~~LOC~~ SOLN
60.0000 mg | Freq: Once | SUBCUTANEOUS | Status: AC
  Administered 2016-05-03: 60 mg via SUBCUTANEOUS
  Filled 2016-05-03: qty 1

## 2016-05-04 LAB — CANCER ANTIGEN 27.29: CA 27.29: 16.3 U/mL (ref 0.0–38.6)

## 2016-05-28 ENCOUNTER — Ambulatory Visit: Payer: Medicare Other

## 2016-05-28 ENCOUNTER — Encounter: Payer: Self-pay | Admitting: Hematology and Oncology

## 2016-05-28 ENCOUNTER — Inpatient Hospital Stay: Payer: Medicare Other

## 2016-05-28 ENCOUNTER — Inpatient Hospital Stay: Payer: Medicare Other | Attending: Hematology and Oncology | Admitting: Hematology and Oncology

## 2016-05-28 VITALS — BP 119/65 | HR 80 | Temp 95.9°F | Resp 18 | Wt 108.8 lb

## 2016-05-28 DIAGNOSIS — E119 Type 2 diabetes mellitus without complications: Secondary | ICD-10-CM | POA: Insufficient documentation

## 2016-05-28 DIAGNOSIS — C50911 Malignant neoplasm of unspecified site of right female breast: Secondary | ICD-10-CM

## 2016-05-28 DIAGNOSIS — Z9011 Acquired absence of right breast and nipple: Secondary | ICD-10-CM | POA: Diagnosis not present

## 2016-05-28 DIAGNOSIS — Z170 Estrogen receptor positive status [ER+]: Secondary | ICD-10-CM | POA: Diagnosis not present

## 2016-05-28 DIAGNOSIS — M810 Age-related osteoporosis without current pathological fracture: Secondary | ICD-10-CM

## 2016-05-28 DIAGNOSIS — E079 Disorder of thyroid, unspecified: Secondary | ICD-10-CM | POA: Insufficient documentation

## 2016-05-28 DIAGNOSIS — Z79899 Other long term (current) drug therapy: Secondary | ICD-10-CM | POA: Diagnosis not present

## 2016-05-28 DIAGNOSIS — I10 Essential (primary) hypertension: Secondary | ICD-10-CM | POA: Diagnosis not present

## 2016-05-28 DIAGNOSIS — Z794 Long term (current) use of insulin: Secondary | ICD-10-CM | POA: Diagnosis not present

## 2016-05-28 DIAGNOSIS — M818 Other osteoporosis without current pathological fracture: Secondary | ICD-10-CM

## 2016-05-28 DIAGNOSIS — E871 Hypo-osmolality and hyponatremia: Secondary | ICD-10-CM | POA: Insufficient documentation

## 2016-05-28 DIAGNOSIS — Z79810 Long term (current) use of selective estrogen receptor modulators (SERMs): Secondary | ICD-10-CM | POA: Insufficient documentation

## 2016-05-28 DIAGNOSIS — E7800 Pure hypercholesterolemia, unspecified: Secondary | ICD-10-CM | POA: Insufficient documentation

## 2016-05-28 LAB — BASIC METABOLIC PANEL
Anion gap: 6 (ref 5–15)
BUN: 11 mg/dL (ref 6–20)
CO2: 22 mmol/L (ref 22–32)
Calcium: 8.3 mg/dL — ABNORMAL LOW (ref 8.9–10.3)
Chloride: 102 mmol/L (ref 101–111)
Creatinine, Ser: 0.86 mg/dL (ref 0.44–1.00)
GFR calc Af Amer: >60 mL/min (ref >60)
GFR calc non Af Amer: >60 mL/min (ref >60)
Glucose, Bld: 109 mg/dL — ABNORMAL HIGH (ref 65–99)
Potassium: 4 mmol/L (ref 3.5–5.1)
Sodium: 130 mmol/L — ABNORMAL LOW (ref 135–145)

## 2016-05-28 NOTE — Progress Notes (Addendum)
[facility] day:  05/28/2016  Chief Complaint: [name] [name] is an 75 y.o. female with stage IIA right breast cancer who is seen for 1 month assessment.  HPI: The patient was last seen in the medical oncology clinic on 04/26/2016.  At that time, she felt well.  Exam was unremarkable.  Labs included a normal CBC with diff, CMP, CA27.29 (18).  Prolia was preauthorized.  She received Prolia on 05/03/2016.  During the interim, she has done well.  She voices no complaints. She has plans for an upcoming cataract surgery.   Past Medical History  Diagnosis Date  . diabetes insulin dep   . Hypertension   . High cholesterol   . Osteoporosis   . Thyroid disease   . Cancer ([place]) 2011    BREAST CA  . Breast cancer ([place]) 2011    RT MASTECTOMY    Past Surgical History  Procedure Laterality Date  . Tonsillectomy    . Total abdominal hysterectomy    . Breast biopsy Right 2011    positive  . Mastectomy Right 2011    positive    Family History  Problem Relation Age of Onset  . Heart disease Father     heart attack  . Breast cancer Neg Hx     Social History:  reports that she has never smoked. She has never used smokeless tobacco. She reports that she does not drink alcohol or use illicit drugs.  The patient is accompanied by her husband, [name] [name].  Allergies:  Allergies  Allergen Reactions  . Sulfa Antibiotics   . Lisinopril Rash    Hyponatremia     Current Medications: Current Outpatient Prescriptions  Medication Sig Dispense Refill  . amLODipine (NORVASC) 2.5 MG tablet Take 2.5 mg by mouth daily.    . calcium-vitamin D (OSCAL-500) 500-400 MG-UNIT per tablet Take by mouth.    . denosumab (PROLIA) 60 MG/ML SOLN injection Inject 60 mg into the skin every 6 (six) months.    . enalapril (VASOTEC) 20 MG tablet Take 20 mg by mouth daily.    . insulin glargine (LANTUS) 100 UNIT/ML injection Inject 4 Units into the skin at bedtime.    . insulin  lispro (HUMALOG) 100 UNIT/ML injection Inject into the skin 3 (three) times daily with meals.     . lovastatin (MEVACOR) 20 MG tablet Take 20 mg by mouth at bedtime.    . tamoxifen (NOLVADEX) 20 MG tablet TAKE ONE TABLET ONCE DAILY 90 tablet 1   No current facility-administered medications for this visit.    Review of Systems:  GENERAL:  Doing well.  No fevers or sweats.  Weight up 2 pounds. PERFORMANCE STATUS (ECOG):  0 HEENT:  No visual changes, runny nose, sore throat, mouth sores or tenderness. Lungs: No shortness of breath or cough.  No hemoptysis. Cardiac:  No chest pain, palpitations, orthopnea, or PND. GI:  No nausea, vomiting, diarrhea, constipation, melena or hematochezia. GU:  No urgency, frequency, dysuria, or hematuria. Musculoskeletal:  No back pain.  No joint pain.  No muscle tenderness. Extremities:  No pain or swelling. Skin:  No rashes or skin changes. Neuro:  No headache, numbness or weakness, balance or coordination issues. Endocrine:  Diabetes.  Thyroid disease on Synthroid.  No hot flashes or night sweats. Psych:  No mood changes, depression or anxiety. Pain:  No focal pain. Review of systems:  All other systems reviewed and found to be negative.   Physical Exam:  Blood pressure 119/65, pulse 80, temperature 95.9 F (35.5 C), temperature source Tympanic, resp. rate 18, weight 108 lb 12.8 oz (49.35 kg). GENERAL:  Well developed, well nourished, sitting comfortably in the exam room in no acute distress. MENTAL STATUS:  Alert and oriented to person, place and time. HEAD:  Short gray hair.  Normocephalic, atraumatic, face symmetric, no Cushingoid features. EYES:  Glasses.  Hazel eyes. No conjunctivitis or scleral icterus. RESPIRATORY:  Clear to auscultation without rales, wheezes or rhonchi. CARDIOVASCULAR:  Regular rate and rhythm without murmur, rub or gallop. ABDOMEN:  Soft, non-tender, with active bowel sounds, and no hepatosplenomegaly.  No masses. EXTREMITIES:  Arthritis changes in hands.  No edema. NEUROLOGICAL: Unremarkable. PSYCH:  Appropriate.    Appointment on 05/28/2016  Component Date Value Ref Range Status  . Sodium 05/28/2016 130* 135 - 145 mmol/L Final  . Potassium 05/28/2016 4.0  3.5 - 5.1 mmol/L Final  . Chloride 05/28/2016 102  101 - 111 mmol/L Final  . CO2 05/28/2016 22  22 - 32 mmol/L Final  . Glucose, Bld 05/28/2016 109* 65 - 99 mg/dL Final  . BUN 05/28/2016 11  6 - 20 mg/dL Final  . Creatinine, Ser 05/28/2016 0.86  0.44 - 1.00 mg/dL Final  . Calcium 05/28/2016 8.3* 8.9 - 10.3 mg/dL Final  . GFR calc non Af Amer 05/28/2016 >60  >60 mL/min Final  . GFR calc Af Amer 05/28/2016 >60  >60 mL/min Final   Comment: (NOTE) The eGFR has been calculated using the CKD EPI equation. This calculation has not been validated in all clinical situations. eGFR's persistently <60 mL/min signify possible Chronic Kidney Disease.   . Anion gap 05/28/2016 6  5 - 15 Final    Assessment:  [name] [name] is an 75 y.o. female with a history of stage IIA right breast cancer s/p mastectomy on 06/01/2010. 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 Eight sentinel lymph nodes were negative for malignancy. Pathologic stage was T2N0M0.  Oncotype DX testing on 06/19/2010 revealed a recurrent score of 31. She did not receive chemotherapy or radiation. She began tamoxifen on 07/07/2010.  CA27.29 was 18 on 10/27/2015 and 16.3 on 05/03/2016.  Left-sided mammogram on 04/23/2016 revealed no evidence of malignancy.  Bone density study on 04/23/2016 revealed osteoporosis with a T-score of -4.5 in L1-L3, and -3.2 in the right femur.  She is on calcium and vitamin D. She was previously on alendronate. She began Prolia on 05/03/2016.  Symptomatically, she feels well.  Exam is unremarkable.  She has hyponatremia (130) and mild hypocalcemia (8.3)   Plan: 1.  Labs today:  BMP. 2.  Discuss electrolytes (sodium and calcium). Discuss fluids with  electrolytes.  Discuss increasing calcium supplementation to 600 mg po BID.   3.  Encourage follow-up with Dr. Ginette Pitman. 4.  RTC on 06/08/2016 for BMP. 5.  RTC on 11/02/2016 for MD assessment, labs (CBC with diff, CMP, CA27.29), and Prolia.   Lequita Asal, MD  05/28/2016, 10:23 AM

## 2016-05-28 NOTE — Progress Notes (Signed)
Offers no complaints. States is feeling well. 

## 2016-06-04 ENCOUNTER — Encounter: Payer: Self-pay | Admitting: *Deleted

## 2016-06-08 ENCOUNTER — Inpatient Hospital Stay: Payer: Medicare Other

## 2016-06-08 DIAGNOSIS — C50911 Malignant neoplasm of unspecified site of right female breast: Secondary | ICD-10-CM

## 2016-06-08 DIAGNOSIS — M810 Age-related osteoporosis without current pathological fracture: Secondary | ICD-10-CM

## 2016-06-08 LAB — BASIC METABOLIC PANEL
Anion gap: 5 (ref 5–15)
BUN: 9 mg/dL (ref 6–20)
CO2: 25 mmol/L (ref 22–32)
Calcium: 8.6 mg/dL — ABNORMAL LOW (ref 8.9–10.3)
Chloride: 103 mmol/L (ref 101–111)
Creatinine, Ser: 0.92 mg/dL (ref 0.44–1.00)
GFR calc Af Amer: >60 mL/min (ref >60)
GFR calc non Af Amer: 59 mL/min — ABNORMAL LOW (ref >60)
Glucose, Bld: 60 mg/dL — ABNORMAL LOW (ref 65–99)
Potassium: 3.6 mmol/L (ref 3.5–5.1)
Sodium: 133 mmol/L — ABNORMAL LOW (ref 135–145)

## 2016-06-10 NOTE — H&P (Signed)
See scanned note.

## 2016-06-11 ENCOUNTER — Ambulatory Visit
Admission: RE | Admit: 2016-06-11 | Discharge: 2016-06-11 | Disposition: A | Discharge status: Home / Self Care | Payer: Medicare Other | Source: Ambulatory Visit | Attending: Ophthalmology | Admitting: Ophthalmology

## 2016-06-11 HISTORY — DX: Hypothyroidism, unspecified: E03.9

## 2016-06-11 MED ORDER — MOXIFLOXACIN HCL 0.5 % OP SOLN: 1.0000 [drp] | OPHTHALMIC | Status: DC | PRN

## 2016-06-11 MED ORDER — SODIUM CHLORIDE 0.9 % IV SOLN: INTRAVENOUS | Status: DC

## 2016-06-11 MED ORDER — PHENYLEPHRINE HCL 10 % OP SOLN: 1.0000 [drp] | OPHTHALMIC | Status: DC | PRN

## 2016-06-11 MED ORDER — CYCLOPENTOLATE HCL 2 % OP SOLN: 1.0000 [drp] | OPHTHALMIC | Status: DC | PRN

## 2016-06-13 ENCOUNTER — Ambulatory Visit: Payer: Medicare Other | Admitting: Registered Nurse

## 2016-06-13 ENCOUNTER — Encounter
Admission: RE | Disposition: A | Discharge status: Home / Self Care | Payer: Self-pay | Source: Ambulatory Visit | Attending: Ophthalmology

## 2016-06-13 ENCOUNTER — Ambulatory Visit
Admission: RE | Admit: 2016-06-13 | Discharge: 2016-06-13 | Disposition: A | Discharge status: Home / Self Care | Payer: Medicare Other | Source: Ambulatory Visit | Attending: Ophthalmology | Admitting: Ophthalmology

## 2016-06-13 ENCOUNTER — Encounter: Payer: Self-pay | Admitting: *Deleted

## 2016-06-13 DIAGNOSIS — Z9010 Acquired absence of unspecified breast and nipple: Secondary | ICD-10-CM | POA: Diagnosis not present

## 2016-06-13 DIAGNOSIS — E079 Disorder of thyroid, unspecified: Secondary | ICD-10-CM | POA: Diagnosis not present

## 2016-06-13 DIAGNOSIS — Z90710 Acquired absence of both cervix and uterus: Secondary | ICD-10-CM | POA: Diagnosis not present

## 2016-06-13 DIAGNOSIS — I10 Essential (primary) hypertension: Secondary | ICD-10-CM | POA: Insufficient documentation

## 2016-06-13 DIAGNOSIS — Z853 Personal history of malignant neoplasm of breast: Secondary | ICD-10-CM | POA: Insufficient documentation

## 2016-06-13 DIAGNOSIS — H2511 Age-related nuclear cataract, right eye: Secondary | ICD-10-CM | POA: Diagnosis present

## 2016-06-13 DIAGNOSIS — M810 Age-related osteoporosis without current pathological fracture: Secondary | ICD-10-CM | POA: Insufficient documentation

## 2016-06-13 DIAGNOSIS — E1136 Type 2 diabetes mellitus with diabetic cataract: Secondary | ICD-10-CM | POA: Diagnosis not present

## 2016-06-13 HISTORY — PX: CATARACT EXTRACTION W/PHACO: SHX586

## 2016-06-13 LAB — GLUCOSE, CAPILLARY: Glucose-Capillary: 266 mg/dL — ABNORMAL HIGH (ref 65–99)

## 2016-06-13 SURGERY — PHACOEMULSIFICATION, CATARACT, WITH IOL INSERTION
Anesthesia: Choice | Laterality: Right

## 2016-06-13 SURGERY — PHACOEMULSIFICATION, CATARACT, WITH IOL INSERTION
Anesthesia: General | Site: Eye | Laterality: Right | Wound class: Clean

## 2016-06-13 MED ORDER — ALFENTANIL 500 MCG/ML IJ INJ
INJECTION | INTRAMUSCULAR | Status: DC | PRN
  Administered 2016-06-13: 500 ug via INTRAVENOUS

## 2016-06-13 MED ORDER — TRYPAN BLUE 0.06 % OP SOLN
OPHTHALMIC | Status: DC | PRN
  Administered 2016-06-13: .5 mL via INTRAOCULAR

## 2016-06-13 MED ORDER — POVIDONE-IODINE 5 % OP SOLN
OPHTHALMIC | Status: DC | PRN
  Administered 2016-06-13: 1 via OPHTHALMIC

## 2016-06-13 MED ORDER — EPINEPHRINE HCL 1 MG/ML IJ SOLN
INTRAMUSCULAR | Status: AC
  Filled 2016-06-13: qty 2

## 2016-06-13 MED ORDER — LIDOCAINE HCL (PF) 4 % IJ SOLN
INTRAMUSCULAR | Status: AC
  Filled 2016-06-13: qty 5

## 2016-06-13 MED ORDER — CYCLOPENTOLATE HCL 1 % OP SOLN
1.0000 [drp] | OPHTHALMIC | Status: DC
  Filled 2016-06-13: qty 2

## 2016-06-13 MED ORDER — TRYPAN BLUE 0.06 % OP SOLN
OPHTHALMIC | Status: AC
  Filled 2016-06-13: qty 0.5

## 2016-06-13 MED ORDER — BUPIVACAINE HCL (PF) 0.75 % IJ SOLN
INTRAMUSCULAR | Status: AC
  Filled 2016-06-13: qty 10

## 2016-06-13 MED ORDER — CEFUROXIME OPHTHALMIC INJECTION 1 MG/0.1 ML
INJECTION | OPHTHALMIC | Status: DC | PRN
  Administered 2016-06-13: .1 mL via INTRACAMERAL

## 2016-06-13 MED ORDER — LIDOCAINE HCL (PF) 4 % IJ SOLN
INTRAMUSCULAR | Status: DC | PRN
  Administered 2016-06-13: .5 mL via OPHTHALMIC

## 2016-06-13 MED ORDER — CARBACHOL 0.01 % IO SOLN
INTRAOCULAR | Status: DC | PRN
  Administered 2016-06-13: .5 mL via INTRAOCULAR

## 2016-06-13 MED ORDER — LIDOCAINE HCL (PF) 4 % IJ SOLN
INTRAMUSCULAR | Status: DC | PRN
  Administered 2016-06-13: 4 mL via OPHTHALMIC

## 2016-06-13 MED ORDER — MOXIFLOXACIN HCL 0.5 % OP SOLN
OPHTHALMIC | Status: DC | PRN
  Administered 2016-06-13: 1 [drp] via OPHTHALMIC

## 2016-06-13 MED ORDER — TETRACAINE HCL 0.5 % OP SOLN
OPHTHALMIC | Status: DC | PRN
  Administered 2016-06-13: 1 [drp] via OPHTHALMIC

## 2016-06-13 MED ORDER — NA CHONDROIT SULF-NA HYALURON 40-17 MG/ML IO SOLN
INTRAOCULAR | Status: DC | PRN
  Administered 2016-06-13: 1 mL via INTRAOCULAR

## 2016-06-13 MED ORDER — PHENYLEPHRINE HCL 10 % OP SOLN
1.0000 [drp] | OPHTHALMIC | Status: AC
  Administered 2016-06-13 (×4): 1 [drp] via OPHTHALMIC

## 2016-06-13 MED ORDER — MIDAZOLAM HCL 2 MG/2ML IJ SOLN
INTRAMUSCULAR | Status: DC | PRN
Start: 2016-06-13 — End: 2016-06-13
  Administered 2016-06-13: 0.5 mg via INTRAVENOUS
  Administered 2016-06-13: 1 mg via INTRAVENOUS
  Administered 2016-06-13: 0.5 mg via INTRAVENOUS

## 2016-06-13 MED ORDER — POVIDONE-IODINE 5 % OP SOLN
OPHTHALMIC | Status: AC
  Filled 2016-06-13: qty 30

## 2016-06-13 MED ORDER — TETRACAINE HCL 0.5 % OP SOLN
OPHTHALMIC | Status: AC
  Filled 2016-06-13: qty 2

## 2016-06-13 MED ORDER — ONDANSETRON HCL 4 MG/2ML IJ SOLN
INTRAMUSCULAR | Status: DC | PRN
  Administered 2016-06-13: 4 mg via INTRAVENOUS

## 2016-06-13 MED ORDER — CYCLOPENTOLATE HCL 2 % OP SOLN
1.0000 [drp] | OPHTHALMIC | Status: AC
  Administered 2016-06-13 (×4): 1 [drp] via OPHTHALMIC

## 2016-06-13 MED ORDER — EPINEPHRINE HCL 1 MG/ML IJ SOLN
INTRAMUSCULAR | Status: DC | PRN
  Administered 2016-06-13: 1 mL via OPHTHALMIC

## 2016-06-13 MED ORDER — SODIUM CHLORIDE 0.9 % IV SOLN
INTRAVENOUS | Status: DC
  Administered 2016-06-13: 09:00:00 via INTRAVENOUS

## 2016-06-13 MED ORDER — NA CHONDROIT SULF-NA HYALURON 40-17 MG/ML IO SOLN
INTRAOCULAR | Status: AC
  Filled 2016-06-13: qty 1

## 2016-06-13 MED ORDER — HYALURONIDASE HUMAN 150 UNIT/ML IJ SOLN
INTRAMUSCULAR | Status: AC
  Filled 2016-06-13: qty 1

## 2016-06-13 MED ORDER — CEFUROXIME OPHTHALMIC INJECTION 1 MG/0.1 ML
INJECTION | OPHTHALMIC | Status: AC
  Filled 2016-06-13: qty 0.1

## 2016-06-13 MED ORDER — MOXIFLOXACIN HCL 0.5 % OP SOLN
1.0000 [drp] | OPHTHALMIC | Status: AC
  Administered 2016-06-13 (×3): 1 [drp] via OPHTHALMIC

## 2016-06-13 SURGICAL SUPPLY — 38 items
BNDG COHESIVE 4X5 TAN STRL (GAUZE/BANDAGES/DRESSINGS) ×3 IMPLANT
CANNULA ANT/CHMB 27GA (MISCELLANEOUS) ×3 IMPLANT
CORD BIP STRL DISP 12FT (MISCELLANEOUS) ×3 IMPLANT
CUP MEDICINE 2OZ PLAST GRAD ST (MISCELLANEOUS) ×3 IMPLANT
DRAPE XRAY CASSETTE 23X24 (DRAPES) ×3 IMPLANT
ERASER HMR WETFIELD 18G (MISCELLANEOUS) ×3 IMPLANT
GLIDE IOL SHEET 35MLX5ML (MISCELLANEOUS) ×3 IMPLANT
GLOVE BIO SURGEON STRL SZ8 (GLOVE) ×3 IMPLANT
GLOVE SURG LX 6.5 MICRO (GLOVE) ×2
GLOVE SURG LX 8.0 MICRO (GLOVE) ×2
GLOVE SURG LX STRL 6.5 MICRO (GLOVE) ×1 IMPLANT
GLOVE SURG LX STRL 8.0 MICRO (GLOVE) ×1 IMPLANT
GOWN STRL REUS W/ TWL LRG LVL3 (GOWN DISPOSABLE) ×1 IMPLANT
GOWN STRL REUS W/ TWL XL LVL3 (GOWN DISPOSABLE) ×1 IMPLANT
GOWN STRL REUS W/TWL LRG LVL3 (GOWN DISPOSABLE) ×2
GOWN STRL REUS W/TWL XL LVL3 (GOWN DISPOSABLE) ×2
LENS IOL +20 DIOP CNVXPLN (Intraocular Lens) ×1 IMPLANT
LENS IOL ACRSF IQ ULTRA 24.0 (Intraocular Lens) IMPLANT
LENS IOL ACRYSOF IQ 24.0 (Intraocular Lens) IMPLANT
LENS IOL KELMAN MF 20.0 (Intraocular Lens) ×1 IMPLANT
LENS IOL KELMAN MULTIFLEX 20.0 (Intraocular Lens) ×2 IMPLANT
MARKER PEN SURG W/LABELS VIOL (MISCELLANEOUS) ×3 IMPLANT
PACK CATARACT (MISCELLANEOUS) ×3 IMPLANT
PACK CATARACT DINGLEDEIN LX (MISCELLANEOUS) ×3 IMPLANT
PACK EYE AFTER SURG (MISCELLANEOUS) ×3 IMPLANT
PACK VIT ANT 23G (MISCELLANEOUS) ×3 IMPLANT
SHLD EYE VISITEC  UNIV (MISCELLANEOUS) ×3 IMPLANT
SOL BSS BAG (MISCELLANEOUS) ×3
SOL PREP PVP 2OZ (MISCELLANEOUS) ×3
SOLUTION BSS BAG (MISCELLANEOUS) ×1 IMPLANT
SOLUTION PREP PVP 2OZ (MISCELLANEOUS) ×1 IMPLANT
SUT ETHILON 10 0 CS140 6 (SUTURE) ×3 IMPLANT
SUT SILK 5-0 (SUTURE) ×3 IMPLANT
SYR 3ML LL SCALE MARK (SYRINGE) ×3 IMPLANT
SYR 5ML LL (SYRINGE) ×3 IMPLANT
SYR TB 1ML 27GX1/2 LL (SYRINGE) ×3 IMPLANT
WATER STERILE IRR 1000ML POUR (IV SOLUTION) ×3 IMPLANT
WIPE NON LINTING 3.25X3.25 (MISCELLANEOUS) ×3 IMPLANT

## 2016-06-13 NOTE — Op Note (Signed)
Date of Surgery: 06/13/2016 Date of Dictation: 06/13/2016 11:28 AM Pre-operative Diagnosis:Nuclear Sclerotic Cataract right Eye Post-operative Diagnosis: same Procedure performed: Extra-capsular Cataract Extraction (ECCE) with placement of a posterior chamber intraocular lens (IOL) right Eye IOL:  Implant Name Type Inv. Item Serial No. Manufacturer Lot No. LRB No. Used  LENS IOL ACRYSOF IQ 24.0 - [id number] Intraocular Lens LENS IOL ACRYSOF IQ 24.0 [id number] ALCON  Right 1  IMPLANT LENS 20.0 - [id number] Intraocular Lens IMPLANT LENS 20.0 [id number] ALCON [id number] Right 1   Anesthesia: 2% Lidocaine and 4% Marcaine in a 50/50 mixture with 10 unites/ml of Hylenex given as a peribulbar Anesthesiologist: Anesthesiologist: Molli Barrows, MD CRNA: Doreen Salvage, CRNA Complications: none Estimated Blood Loss: less than 1 ml  Description of procedure:  The patient was given anesthesia and sedation via intravenous access. The patient was then prepped and draped in the usual fashion. A 25-gauge needle was bent for initiating the capsulorhexis. A 5-0 silk suture was placed through the conjunctiva superior and inferiorly to serve as bridle sutures. Hemostasis was obtained at the superior limbus using an eraser cautery. A partial thickness groove was made at the anterior surgical limbus with a 64 Beaver blade and this was dissected anteriorly with an [employer]. The anterior chamber was entered at 10 o'clock and 2 o'clock with a 1.0 mm paracentesis knife. Epi-Shugarcaine 0.5 CC [9 cc BSS Plus (Alcon), 3 cc 4% preservative-free lidocaine (Hospira) and 4 cc 1:1000 preservative-free, bisulfite-free epinephrine] was injected into the anterior chamber via the paracentesis tract. Air was introduced via the paracentesis to replace the aqueous and Vision Blue was used to paint the surface of the anterior capsule. The air was then replaced with DiscoVisc.   The anterior chamber was entered through  the lamellar dissection with a 2.6 mm Alcon keratome.A continuous tear curvilinear capsulorhexis was performed using a bent 25-gauge needle.  Balance salt on a syringe was used to perform hydro-dissection and phacoemulsification was carried out using a divide and conquer technique. Procedure(s) with comments: CATARACT EXTRACTION PHACO AND INTRAOCULAR LENS PLACEMENT (IOC) (Right) - [place] 03:07 AP% 27.4 CDE 93.21 fluid pack lot # [id number].  While spinning the nucleus, the inferior zonules gave way and the nucleus slipped into a vertical position. Some of it was removed with quadrant removal, but the remaining zonules broke and the lens was lost into the back of the eye.   The vitrector was brought to the table and the paracentesis was enlarged with the paracentesis knife to allow introduction of the irrigation cannula. The vitrector was introduced through the 2.6 mm incision and a vitrectromy was carried out using the cut-I/A mode. A Weck Cel was placed at the wound to determine that no vitreous was at the wound. DiscoVisc was then placed in the anterior chamber to fill it.   The wound was enlarged using the keratome and a Sheets glide was placed across the chamber under the cover of [place]. Miostate was injected via the paracentesis. The anterior chamber lens was placed and the Sheets glide was removed. A Sinskey hook was used to place the superior haptics under the edge of the incision. The iris remained round and the lens was in good position. Four interrupted 10-0 nylon sutures were placed across the wound and a single nylon was placed across the paracentesis that was enlarged. The knots were rotated and buried superiorly on the cornea side. The wound was checked for leaks and none were found.  0.1 ml of cefuroxime containing 1 mg of drug  was injected via the paracentesis track. The wound was checked for leaks again and none were found.   The bridal sutures were removed and two drops of Vigamox  were placed on the eye. An eye shield was placed to protect the eye and the patient was discharged to the recovery area in good condition.   Deejay Koppelman MD

## 2016-06-13 NOTE — Interval H&P Note (Signed)
History and Physical Interval Note:  06/13/2016 9:50 AM  [name] [name]  has presented today for surgery, with the diagnosis of Cataract  The various methods of treatment have been discussed with the patient and family. After consideration of risks, benefits and other options for treatment, the patient has consented to  Procedure(s): CATARACT EXTRACTION PHACO AND INTRAOCULAR LENS PLACEMENT ([place]) (Right) as a surgical intervention .  The patient's history has been reviewed, patient examined, no change in status, stable for surgery.  I have reviewed the patient's chart and labs.  Questions were answered to the patient's satisfaction.     Kizzy Olafson

## 2016-06-13 NOTE — Anesthesia Preprocedure Evaluation (Signed)
Anesthesia Evaluation  Patient identified by MRN, date of birth, ID band Patient awake    Reviewed: Allergy & Precautions, H&P , NPO status , Patient's Chart, lab work & pertinent test results, reviewed documented beta blocker date and time   Airway Mallampati: II   Neck ROM: full    Dental  (+) Poor Dentition   Pulmonary neg pulmonary ROS,    Pulmonary exam normal        Cardiovascular hypertension, On Medications negative cardio ROS Normal cardiovascular exam Rhythm:regular Rate:Normal     Neuro/Psych negative neurological ROS  negative psych ROS   GI/Hepatic negative GI ROS, Neg liver ROS,   Endo/Other  negative endocrine ROSdiabetesHypothyroidism   Renal/GU negative Renal ROS  negative genitourinary   Musculoskeletal   Abdominal   Peds  Hematology negative hematology ROS (+)   Anesthesia Other Findings Past Medical History: 2011: Breast cancer ([place])     Comment: RT MASTECTOMY 2011: Cancer ([place])     Comment: BREAST CA No date: diabetes insulin dep No date: High cholesterol No date: Hypertension No date: Hypothyroidism No date: Osteoporosis No date: Thyroid disease Past Surgical History: 2011: BREAST BIOPSY Right     Comment: positive 2011: MASTECTOMY Right     Comment: positive No date: TONSILLECTOMY No date: TOTAL ABDOMINAL HYSTERECTOMY   Reproductive/Obstetrics                             Anesthesia Physical Anesthesia Plan  ASA: III  Anesthesia Plan: General   Post-op Pain Management:    Induction:   Airway Management Planned:   Additional Equipment:   Intra-op Plan:   Post-operative Plan:   Informed Consent: I have reviewed the patients History and Physical, chart, labs and discussed the procedure including the risks, benefits and alternatives for the proposed anesthesia with the patient or authorized representative who has indicated his/her understanding  and acceptance.   Dental Advisory Given  Plan Discussed with: CRNA  Anesthesia Plan Comments:         Anesthesia Quick Evaluation

## 2016-06-13 NOTE — H&P (View-Only) (Signed)
See scanned note.

## 2016-06-13 NOTE — Anesthesia Postprocedure Evaluation (Signed)
Anesthesia Post Note  Patient: [name] [name]  Procedure(s) Performed: Procedure(s) (LRB): CATARACT EXTRACTION PHACO AND INTRAOCULAR LENS PLACEMENT ([facility]) (Right)  Patient location during evaluation: Other Anesthesia Type: General Pain management: pain level controlled Vital Signs Assessment: post-procedure vital signs reviewed and stable Respiratory status: spontaneous breathing Cardiovascular status: blood pressure returned to baseline and stable Postop Assessment: no headache, no backache and no signs of nausea or vomiting Anesthetic complications: no    Last Vitals:  Vitals:   06/13/16 1118 06/13/16 1119  BP: (!) 119/46 (!) 119/46  Pulse: 89 88  Resp: 12 16  Temp: 36.4 C 36.4 C    Last Pain:  Vitals:   06/13/16 1119  TempSrc: Tympanic  PainSc: 0-No pain                 Doreen Salvage A

## 2016-06-13 NOTE — Discharge Instructions (Addendum)
°  Eye Surgery Discharge Instructions  Expect mild scratchy sensation or mild soreness. DO NOT RUB YOUR EYE!  The day of surgery:  Minimal physical activity, but bed rest is not required  No reading, computer work, or close hand work  No bending, lifting, or straining.  May watch TV  For 24 hours:  No driving, legal decisions, or alcoholic beverages  Safety precautions  Eat anything you prefer: It is better to start with liquids, then soup then solid foods.  _XXX__ Eye patch should be worn until postoperative exam tomorrow.  ____ Solar shield eyeglasses should be worn for comfort in the sunlight/patch while sleeping  Resume all regular medications including aspirin or Coumadin if these were discontinued prior to surgery. You may shower, bathe, shave, or wash your hair. Tylenol may be taken for mild discomfort.  Call your doctor if you experience significant pain, nausea, or vomiting, fever > 101 or other signs of infection. [phone] or [phone] Specific instructions:

## 2016-06-13 NOTE — Transfer of Care (Signed)
Immediate Anesthesia Transfer of Care Note  Patient: [name] [name]  Procedure(s) Performed: Procedure(s) with comments: CATARACT EXTRACTION PHACO AND INTRAOCULAR LENS PLACEMENT ([facility]) (Right) - [place] 03:07 AP% 27.4 CDE 93.21 fluid pack lot # [id number]  Patient Location: PHASE II  Anesthesia Type:MAC  Level of Consciousness: Awake, Alert, Oriented  Airway & Oxygen Therapy: Patient Spontanous Breathing and Patient on room air   Post-op Assessment: Report given to RN and Post -op Vital signs reviewed and stable  Post vital signs: Reviewed and stable  Last Vitals:  Vitals:   06/13/16 1118 06/13/16 1119  BP: (!) 119/46 (!) 119/46  Pulse: 89 88  Resp: 12 16  Temp: 36.4 C [id number]    Complications: No apparent anesthesia complications

## 2016-06-14 ENCOUNTER — Encounter: Payer: Self-pay | Admitting: Ophthalmology

## 2016-06-18 ENCOUNTER — Emergency Department: Payer: Medicare Other

## 2016-06-18 ENCOUNTER — Inpatient Hospital Stay
Admission: EM | Admit: 2016-06-18 | Discharge: 2016-06-21 | DRG: 641 | Disposition: A | Discharge status: Home / Self Care | Payer: Medicare Other | Attending: Internal Medicine | Admitting: Internal Medicine

## 2016-06-18 ENCOUNTER — Encounter: Payer: Self-pay | Admitting: Emergency Medicine

## 2016-06-18 DIAGNOSIS — N183 Chronic kidney disease, stage 3 (moderate): Secondary | ICD-10-CM | POA: Diagnosis present

## 2016-06-18 DIAGNOSIS — Z794 Long term (current) use of insulin: Secondary | ICD-10-CM | POA: Diagnosis not present

## 2016-06-18 DIAGNOSIS — M810 Age-related osteoporosis without current pathological fracture: Secondary | ICD-10-CM | POA: Diagnosis present

## 2016-06-18 DIAGNOSIS — E872 Acidosis: Secondary | ICD-10-CM | POA: Diagnosis present

## 2016-06-18 DIAGNOSIS — Z79899 Other long term (current) drug therapy: Secondary | ICD-10-CM | POA: Diagnosis not present

## 2016-06-18 DIAGNOSIS — E876 Hypokalemia: Secondary | ICD-10-CM | POA: Diagnosis present

## 2016-06-18 DIAGNOSIS — Z90710 Acquired absence of both cervix and uterus: Secondary | ICD-10-CM | POA: Diagnosis not present

## 2016-06-18 DIAGNOSIS — E860 Dehydration: Secondary | ICD-10-CM

## 2016-06-18 DIAGNOSIS — Z8249 Family history of ischemic heart disease and other diseases of the circulatory system: Secondary | ICD-10-CM

## 2016-06-18 DIAGNOSIS — E871 Hypo-osmolality and hyponatremia: Secondary | ICD-10-CM | POA: Diagnosis present

## 2016-06-18 DIAGNOSIS — N179 Acute kidney failure, unspecified: Secondary | ICD-10-CM | POA: Diagnosis present

## 2016-06-18 DIAGNOSIS — R81 Glycosuria: Secondary | ICD-10-CM | POA: Diagnosis present

## 2016-06-18 DIAGNOSIS — Z9010 Acquired absence of unspecified breast and nipple: Secondary | ICD-10-CM

## 2016-06-18 DIAGNOSIS — E039 Hypothyroidism, unspecified: Secondary | ICD-10-CM | POA: Diagnosis present

## 2016-06-18 DIAGNOSIS — Z961 Presence of intraocular lens: Secondary | ICD-10-CM | POA: Diagnosis present

## 2016-06-18 DIAGNOSIS — I129 Hypertensive chronic kidney disease with stage 1 through stage 4 chronic kidney disease, or unspecified chronic kidney disease: Secondary | ICD-10-CM | POA: Diagnosis present

## 2016-06-18 DIAGNOSIS — Z9841 Cataract extraction status, right eye: Secondary | ICD-10-CM | POA: Diagnosis not present

## 2016-06-18 DIAGNOSIS — H40051 Ocular hypertension, right eye: Secondary | ICD-10-CM

## 2016-06-18 DIAGNOSIS — E1122 Type 2 diabetes mellitus with diabetic chronic kidney disease: Secondary | ICD-10-CM | POA: Diagnosis present

## 2016-06-18 DIAGNOSIS — K2900 Acute gastritis without bleeding: Secondary | ICD-10-CM | POA: Diagnosis present

## 2016-06-18 DIAGNOSIS — Z882 Allergy status to sulfonamides status: Secondary | ICD-10-CM | POA: Diagnosis not present

## 2016-06-18 DIAGNOSIS — R748 Abnormal levels of other serum enzymes: Secondary | ICD-10-CM | POA: Diagnosis present

## 2016-06-18 DIAGNOSIS — H4010X0 Unspecified open-angle glaucoma, stage unspecified: Secondary | ICD-10-CM | POA: Diagnosis present

## 2016-06-18 DIAGNOSIS — Z853 Personal history of malignant neoplasm of breast: Secondary | ICD-10-CM

## 2016-06-18 LAB — BLOOD GAS, VENOUS
ACID-BASE DEFICIT: 11.8 mmol/L — AB (ref 0.0–2.0)
Bicarbonate: 13.8 mEq/L — ABNORMAL LOW (ref 21.0–28.0)
FIO2: 0.21
O2 Saturation: 55.4 %
PATIENT TEMPERATURE: 37
pCO2, Ven: 30 mmHg — ABNORMAL LOW (ref 44.0–60.0)
pH, Ven: 7.27 — ABNORMAL LOW (ref 7.320–7.430)
pO2, Ven: 34 mmHg (ref 31.0–45.0)

## 2016-06-18 LAB — CBC
HEMATOCRIT: 38.7 % (ref 35.0–47.0)
HEMOGLOBIN: 13.7 g/dL (ref 12.0–16.0)
MCH: 34.7 pg — ABNORMAL HIGH (ref 26.0–34.0)
MCHC: 35.5 g/dL (ref 32.0–36.0)
MCV: 97.8 fL (ref 80.0–100.0)
Platelets: 205 10*3/uL (ref 150–440)
RBC: 3.96 MIL/uL (ref 3.80–5.20)
RDW: 13.4 % (ref 11.5–14.5)
WBC: 12.8 10*3/uL — AB (ref 3.6–11.0)

## 2016-06-18 LAB — GLUCOSE, CAPILLARY
GLUCOSE-CAPILLARY: 364 mg/dL — AB (ref 65–99)
GLUCOSE-CAPILLARY: 328 mg/dL — AB (ref 65–99)
GLUCOSE-CAPILLARY: 306 mg/dL — AB (ref 65–99)
Glucose-Capillary: 291 mg/dL — ABNORMAL HIGH (ref 65–99)

## 2016-06-18 LAB — COMPREHENSIVE METABOLIC PANEL
ALBUMIN: 4 g/dL (ref 3.5–5.0)
ALT: 17 U/L (ref 14–54)
ANION GAP: 16 — AB (ref 5–15)
AST: 31 U/L (ref 15–41)
Alkaline Phosphatase: 30 U/L — ABNORMAL LOW (ref 38–126)
BUN: 37 mg/dL — AB (ref 6–20)
CHLORIDE: 89 mmol/L — AB (ref 101–111)
CO2: 15 mmol/L — ABNORMAL LOW (ref 22–32)
Calcium: 8 mg/dL — ABNORMAL LOW (ref 8.9–10.3)
Creatinine, Ser: 1.3 mg/dL — ABNORMAL HIGH (ref 0.44–1.00)
GFR calc Af Amer: 45 mL/min — ABNORMAL LOW (ref >60)
GFR, EST NON AFRICAN AMERICAN: 39 mL/min — AB (ref >60)
Glucose, Bld: 370 mg/dL — ABNORMAL HIGH (ref 65–99)
POTASSIUM: 4.9 mmol/L (ref 3.5–5.1)
Sodium: 120 mmol/L — ABNORMAL LOW (ref 135–145)
Total Bilirubin: 1.1 mg/dL (ref 0.3–1.2)
Total Protein: 6.4 g/dL — ABNORMAL LOW (ref 6.5–8.1)

## 2016-06-18 LAB — BASIC METABOLIC PANEL
Anion gap: 13 (ref 5–15)
BUN: 27 mg/dL — AB (ref 6–20)
CALCIUM: 7.1 mg/dL — AB (ref 8.9–10.3)
CO2: 12 mmol/L — ABNORMAL LOW (ref 22–32)
CREATININE: 0.98 mg/dL (ref 0.44–1.00)
Chloride: 98 mmol/L — ABNORMAL LOW (ref 101–111)
GFR calc Af Amer: >60 mL/min (ref >60)
GFR, EST NON AFRICAN AMERICAN: 54 mL/min — AB (ref >60)
Glucose, Bld: 288 mg/dL — ABNORMAL HIGH (ref 65–99)
POTASSIUM: 4 mmol/L (ref 3.5–5.1)
SODIUM: 123 mmol/L — AB (ref 135–145)

## 2016-06-18 LAB — URINALYSIS COMPLETE WITH MICROSCOPIC (ARMC ONLY)
BACTERIA UA: NONE SEEN
BILIRUBIN URINE: NEGATIVE
Glucose, UA: >500 mg/dL — AB
LEUKOCYTES UA: NEGATIVE
Nitrite: NEGATIVE
PH: 5 (ref 5.0–8.0)
PROTEIN: NEGATIVE mg/dL
Specific Gravity, Urine: 1.016 (ref 1.005–1.030)

## 2016-06-18 LAB — TROPONIN I
TROPONIN I: 0.07 ng/mL — AB (ref <0.03)
TROPONIN I: 0.06 ng/mL — AB (ref <0.03)
Troponin I: 0.07 ng/mL (ref <0.03)

## 2016-06-18 LAB — LIPASE, BLOOD: LIPASE: 15 U/L (ref 11–51)

## 2016-06-18 LAB — OSMOLALITY, URINE: OSMOLALITY UR: 608 mosm/kg (ref 300–900)

## 2016-06-18 MED ORDER — INSULIN ASPART 100 UNIT/ML ~~LOC~~ SOLN
4.0000 [IU] | Freq: Once | SUBCUTANEOUS | Status: AC
  Administered 2016-06-18: 4 [IU] via SUBCUTANEOUS
  Filled 2016-06-18: qty 4

## 2016-06-18 MED ORDER — SODIUM CHLORIDE 0.9 % IV SOLN
INTRAVENOUS | Status: DC
  Administered 2016-06-18: 16:00:00 via INTRAVENOUS

## 2016-06-18 MED ORDER — INSULIN GLARGINE 100 UNIT/ML ~~LOC~~ SOLN
4.0000 [IU] | Freq: Every day | SUBCUTANEOUS | Status: DC
  Administered 2016-06-18 – 2016-06-20 (×3): 4 [IU] via SUBCUTANEOUS
  Filled 2016-06-18 (×4): qty 0.04

## 2016-06-18 MED ORDER — BRIMONIDINE TARTRATE 0.2 % OP SOLN
1.0000 [drp] | Freq: Three times a day (TID) | OPHTHALMIC | Status: DC
  Administered 2016-06-18 – 2016-06-21 (×9): 1 [drp] via OPHTHALMIC
  Filled 2016-06-18: qty 5

## 2016-06-18 MED ORDER — DORZOLAMIDE HCL-TIMOLOL MAL 2-0.5 % OP SOLN
1.0000 [drp] | Freq: Two times a day (BID) | OPHTHALMIC | Status: DC
  Administered 2016-06-18 – 2016-06-21 (×7): 1 [drp] via OPHTHALMIC
  Filled 2016-06-18: qty 10

## 2016-06-18 MED ORDER — SODIUM CHLORIDE 0.9 % IV BOLUS (SEPSIS)
250.0000 mL | Freq: Once | INTRAVENOUS | Status: AC
  Administered 2016-06-18: 250 mL via INTRAVENOUS

## 2016-06-18 MED ORDER — ASPIRIN EC 325 MG PO TBEC
325.0000 mg | DELAYED_RELEASE_TABLET | Freq: Every day | ORAL | Status: DC
  Administered 2016-06-18: 325 mg via ORAL
  Filled 2016-06-18 (×3): qty 1

## 2016-06-18 MED ORDER — PREDNISOLONE ACETATE 1 % OP SUSP
1.0000 [drp] | Freq: Four times a day (QID) | OPHTHALMIC | Status: DC
  Administered 2016-06-18 – 2016-06-21 (×11): 1 [drp] via OPHTHALMIC
  Filled 2016-06-18: qty 1

## 2016-06-18 MED ORDER — MOXIFLOXACIN HCL 0.5 % OP SOLN
1.0000 [drp] | Freq: Four times a day (QID) | OPHTHALMIC | Status: DC
  Administered 2016-06-18 – 2016-06-21 (×11): 1 [drp] via OPHTHALMIC
  Filled 2016-06-18: qty 3

## 2016-06-18 MED ORDER — SODIUM CHLORIDE 0.9% FLUSH
3.0000 mL | Freq: Two times a day (BID) | INTRAVENOUS | Status: DC
  Administered 2016-06-19 – 2016-06-21 (×3): 3 mL via INTRAVENOUS

## 2016-06-18 MED ORDER — SODIUM CHLORIDE 0.9 % IV BOLUS (SEPSIS)
500.0000 mL | Freq: Once | INTRAVENOUS | Status: AC
  Administered 2016-06-18: 500 mL via INTRAVENOUS

## 2016-06-18 MED ORDER — ENOXAPARIN SODIUM 40 MG/0.4ML ~~LOC~~ SOLN
30.0000 mg | SUBCUTANEOUS | Status: DC
  Administered 2016-06-18: 30 mg via SUBCUTANEOUS
  Filled 2016-06-18 (×3): qty 0.4

## 2016-06-18 MED ORDER — INSULIN ASPART 100 UNIT/ML ~~LOC~~ SOLN
0.0000 [IU] | Freq: Three times a day (TID) | SUBCUTANEOUS | Status: DC
  Administered 2016-06-19 (×2): 5 [IU] via SUBCUTANEOUS
  Administered 2016-06-19: 3 [IU] via SUBCUTANEOUS
  Administered 2016-06-20: 5 [IU] via SUBCUTANEOUS
  Administered 2016-06-20 (×2): 3 [IU] via SUBCUTANEOUS
  Administered 2016-06-21 (×2): 5 [IU] via SUBCUTANEOUS
  Filled 2016-06-18: qty 5
  Filled 2016-06-18: qty 3
  Filled 2016-06-18 (×2): qty 5
  Filled 2016-06-18: qty 3
  Filled 2016-06-18 (×2): qty 5

## 2016-06-18 NOTE — Consult Note (Signed)
Reason for Consult: eye pain Referring Physician: Dr. Jacqualine Code, ED physician.  [name] [name] is an 75 y.o. female.  Chief complaint: nausea, eye pain since cataract surgery 06/13/16.   HPI:   75 yo WF with PMH significant for DM, HTN, HLD and past ocular history significant for complicated cataract surgery last week presents to the ED with weakness, nausea and vomiting.  History is obtained via discussion with husband, chart review and discussion with previous providers.  Since the cataract surgery on the right eye she has had eye discomfort and nausea and poorly controlled serum glucose.   Intraocular pressure one day postop on 7/27 was 14 mm; 2 days postop IOP was 24 mm.  On 7/28 she had a followup appointment with the retina specialist at [facility] Dr. Jacklynn Ganong and planned for a second ocular surgery at [facility] today 7/31 to remove retained lens fragments; however, she was feeling very poorly so elected to go instead to the [facility] Emergency Department and reschedule the ocular surgery.  Initial eval at the ED revealed significant medical issues including hyponatremia, acute kidney injury, ketoacidosis, EKG changes and elevated troponin levels, so she was admitted to the hospitalist service.    Past Medical History:  Diagnosis Date  . Breast cancer ([place]) 2011   RT MASTECTOMY  . Cancer ([place]) 2011   BREAST CA  . diabetes insulin dep   . High cholesterol   . Hypertension   . Hypothyroidism   . Osteoporosis   . Thyroid disease     ROS  Past Surgical History:  Procedure Laterality Date  . BREAST BIOPSY Right 2011   positive  . CATARACT EXTRACTION W/PHACO Right 06/13/2016   Procedure: CATARACT EXTRACTION PHACO AND INTRAOCULAR LENS PLACEMENT (IOC);  Surgeon: Estill Cotta, MD;  Location: [facility] ORS;  Service: Ophthalmology;  Laterality: Right;  [place] 03:07 AP% 27.4 CDE 93.21 fluid pack lot # [id number]  . MASTECTOMY Right 2011   positive  . TONSILLECTOMY    . TOTAL  ABDOMINAL HYSTERECTOMY      Family History  Problem Relation Age of Onset  . Heart disease Father     heart attack  . Breast cancer Neg Hx     Social History:  reports that she has never smoked. She has never used smokeless tobacco. She reports that she does not drink alcohol or use drugs.  Allergies:  Allergies  Allergen Reactions  . Sulfa Antibiotics   . Lisinopril Rash    Hyponatremia     Medications: I have reviewed the patient's current medications.  Results for orders placed or performed during the hospital encounter of 06/18/16 (from the past 48 hour(s))  Glucose, capillary     Status: Abnormal   Collection Time: 06/18/16  9:10 AM  Result Value Ref Range   Glucose-Capillary 364 (H) 65 - 99 mg/dL  Troponin I     Status: Abnormal   Collection Time: 06/18/16  9:14 AM  Result Value Ref Range   Troponin I 0.07 (HH) <0.03 ng/mL    Comment: CRITICAL RESULT CALLED TO, READ BACK BY AND VERIFIED WITH KIM GAULT AT 1039 06/18/16 DAS   Lipase, blood     Status: None   Collection Time: 06/18/16  9:15 AM  Result Value Ref Range   Lipase 15 11 - 51 U/L  Comprehensive metabolic panel     Status: Abnormal   Collection Time: 06/18/16  9:15 AM  Result Value Ref Range   Sodium 120 (L) 135 - 145  mmol/L   Potassium 4.9 3.5 - 5.1 mmol/L   Chloride 89 (L) 101 - 111 mmol/L   CO2 15 (L) 22 - 32 mmol/L   Glucose, Bld 370 (H) 65 - 99 mg/dL   BUN 37 (H) 6 - 20 mg/dL   Creatinine, Ser 1.30 (H) 0.44 - 1.00 mg/dL   Calcium 8.0 (L) 8.9 - 10.3 mg/dL   Total Protein 6.4 (L) 6.5 - 8.1 g/dL   Albumin 4.0 3.5 - 5.0 g/dL   AST 31 15 - 41 U/L   ALT 17 14 - 54 U/L   Alkaline Phosphatase 30 (L) 38 - 126 U/L   Total Bilirubin 1.1 0.3 - 1.2 mg/dL   GFR calc non Af Amer 39 (L) >60 mL/min   GFR calc Af Amer 45 (L) >60 mL/min    Comment: (NOTE) The eGFR has been calculated using the CKD EPI equation. This calculation has not been validated in all clinical situations. eGFR's persistently <60 mL/min  signify possible Chronic Kidney Disease.    Anion gap 16 (H) 5 - 15  CBC     Status: Abnormal   Collection Time: 06/18/16  9:15 AM  Result Value Ref Range   WBC 12.8 (H) 3.6 - 11.0 K/uL   RBC 3.96 3.80 - 5.20 MIL/uL   Hemoglobin 13.7 12.0 - 16.0 g/dL   HCT 38.7 35.0 - 47.0 %   MCV 97.8 80.0 - 100.0 fL   MCH 34.7 (H) 26.0 - 34.0 pg   MCHC 35.5 32.0 - 36.0 g/dL   RDW 13.4 11.5 - 14.5 %   Platelets 205 150 - 440 K/uL  Blood gas, venous     Status: Abnormal   Collection Time: 06/18/16  9:47 AM  Result Value Ref Range   FIO2 0.21    pH, Ven 7.27 (L) 7.320 - 7.430   pCO2, Ven 30 (L) 44.0 - 60.0 mmHg   pO2, Ven 34.0 31.0 - 45.0 mmHg   Bicarbonate 13.8 (L) 21.0 - 28.0 mEq/L   Acid-base deficit 11.8 (H) 0.0 - 2.0 mmol/L   O2 Saturation 55.4 %   Patient temperature 37.0    Collection site VENOUS    Sample type VENOUS   Urinalysis complete, with microscopic     Status: Abnormal   Collection Time: 06/18/16  9:50 AM  Result Value Ref Range   Color, Urine STRAW (A) YELLOW   APPearance CLEAR (A) CLEAR   Glucose, UA >500 (A) NEGATIVE mg/dL   Bilirubin Urine NEGATIVE NEGATIVE   Ketones, ur 2+ (A) NEGATIVE mg/dL   Specific Gravity, Urine 1.016 1.005 - 1.030   Hgb urine dipstick 2+ (A) NEGATIVE   pH 5.0 5.0 - 8.0   Protein, ur NEGATIVE NEGATIVE mg/dL   Nitrite NEGATIVE NEGATIVE   Leukocytes, UA NEGATIVE NEGATIVE   RBC / HPF 0-5 0 - 5 RBC/hpf   WBC, UA 0-5 0 - 5 WBC/hpf   Bacteria, UA NONE SEEN NONE SEEN   Squamous Epithelial / LPF 0-5 (A) NONE SEEN  Glucose, capillary     Status: Abnormal   Collection Time: 06/18/16 12:18 PM  Result Value Ref Range   Glucose-Capillary 328 (H) 65 - 99 mg/dL  Glucose, capillary     Status: Abnormal   Collection Time: 06/18/16  1:24 PM  Result Value Ref Range   Glucose-Capillary 306 (H) 65 - 99 mg/dL  Troponin I     Status: Abnormal   Collection Time: 06/18/16  3:28 PM  Result Value Ref Range  Troponin I 0.07 (HH) <0.03 ng/mL    Comment:  CRITICAL VALUE NOTED. VALUE IS CONSISTENT WITH PREVIOUSLY REPORTED/CALLED VALUE  Osmolality, urine     Status: None   Collection Time: 06/18/16  5:08 PM  Result Value Ref Range   Osmolality, Ur 608 300 - 900 mOsm/kg    Ct Head Wo Contrast  Result Date: 06/18/2016 CLINICAL DATA:  Nausea, vomiting past 5 days. EXAM: CT HEAD WITHOUT CONTRAST TECHNIQUE: Contiguous axial images were obtained from the base of the skull through the vertex without intravenous contrast. COMPARISON:  02/15/2009 FINDINGS: There is atrophy and chronic small vessel disease changes. No acute intracranial abnormality. Specifically, no hemorrhage, hydrocephalus, mass lesion, acute infarction, or significant intracranial injury. No acute calvarial abnormality. Visualized paranasal sinuses and mastoids clear. Orbital soft tissues unremarkable. IMPRESSION: No acute intracranial abnormality. Atrophy, chronic microvascular disease. Electronically Signed   By: Rolm Baptise M.D.   On: 06/18/2016 12:04   Blood pressure (!) 115/42, pulse 73, temperature 98.2 F (36.8 C), temperature source Oral, resp. rate 19, height _0  (1.626 m), weight 42.6 kg (93 lb 14.4 oz), SpO2 98 %.   Visual Acuity:  20/400 OD  near [place]  Pupils:  OD is peaked superiorly.  Motility:  Full/ orthophoric  Visual Fields:  Full to confrontation  IOP:  OD: 30 mm by tonopen OS: 14 mm.  External/ Lids/ Lashes:  Normal  Anterior Segment:  Conjunctiva:  1+ hyperemia OD.   Cornea:  1+ haze and edema OD.  Interrupted nylon sutures superiorly.  Normal OS  Anterior Chamber: Grossly deep and quiet  Pupil slightly peaked superiorly.  Lens:   Anterior chamber intraocular lens OD.  Posterior Segment:  Not dilated today.  Evaluated at [facility] on Friday, known to have dropped nuclear lens fragments in the posterior segment.    Assessment/Plan:  1.  Multiple medical issues: treated by hospitalists, appreciate their management of this complex medical patient.  2.   Retained lens fragments in the right eye following cataract surgery.  After patient is stabilized from medical issues, patient will need to have outpatient ocular surgery by a retina specialist at [facility].  In the meantime, will stabilize with topical glaucoma drops, antibiotics and steroids:  -- prednisilone acetate 1% four times per day in the right eye. -- moxifloxacin four times per day right eye. -- brimonidine 3 times per day right eye. -- dorzolamide/timolol 2 times per day right eye.  I will continue to follow the patient.   Discussed with Dr. Luna Glasgow (retina specialist), Dr. Chauncey Cruel Dingeldein (cataract surgeon), Dr. Jacqualine Code (ED physician) and Dr. Margaretmary Eddy (admitting hospitalist).   Benay Pillow  06/18/2016, 7:35 PM

## 2016-06-18 NOTE — ED Provider Notes (Signed)
[facility] Emergency Department Provider Note   ____________________________________________   First MD Initiated Contact with Patient 06/18/16 [phone]     (approximate)  I have reviewed the triage vital signs and the nursing notes.   HISTORY  Chief Complaint Emesis    HPI [name] [name] is a 75 y.o. female who comes for evaluation of vomiting and weakness. She been feeling very tired and having nausea with vomiting since having cataract surgery on Wednesday. Since Thursday she was having a mild headache and nausea with vomiting, headache is gone away and she denies any eye pain but she notes feeling very dehydrated weak and tired for the last several days. No fevers chills. No abdominal pain. No shortness of breath or chest pain.   Past Medical History:  Diagnosis Date  . Breast cancer ([place]) 2011   RT MASTECTOMY  . Cancer ([place]) 2011   BREAST CA  . diabetes insulin dep   . High cholesterol   . Hypertension   . Hypothyroidism   . Osteoporosis   . Thyroid disease     Patient Active Problem List   Diagnosis Date Noted  . Hyponatremia 06/18/2016  . Osteoporosis 04/23/2016  . Malignant neoplasm of breast ([place]) 03/30/2016  . HLD (hyperlipidemia) 03/30/2016  . BP (high blood pressure) 03/30/2016  . Adult hypothyroidism 03/30/2016  . Type 1 diabetes mellitus ([place]) 02/10/2016  . Type 1 diabetes mellitus without complication ([place]) [id number]  . Dermatophytosis of nail 08/24/2013  . Pain in lower limb 08/24/2013    Past Surgical History:  Procedure Laterality Date  . BREAST BIOPSY Right 2011   positive  . CATARACT EXTRACTION W/PHACO Right 06/13/2016   Procedure: CATARACT EXTRACTION PHACO AND INTRAOCULAR LENS PLACEMENT (IOC);  Surgeon: Estill Cotta, MD;  Location: [facility] ORS;  Service: Ophthalmology;  Laterality: Right;  [place] 03:07 AP% 27.4 CDE 93.21 fluid pack lot # [id number]  . MASTECTOMY Right 2011   positive  . TONSILLECTOMY    . TOTAL  ABDOMINAL HYSTERECTOMY      Prior to Admission medications   Medication Sig Start Date End Date Taking? Authorizing Provider  amLODipine (NORVASC) 5 MG tablet Take 1 tablet by mouth daily. 05/07/16  Yes Historical Provider, MD  calcium-vitamin D (OSCAL-500) 500-400 MG-UNIT per tablet Take 1 tablet by mouth daily.    Yes Historical Provider, MD  denosumab (PROLIA) 60 MG/ML SOLN injection Inject 60 mg into the skin every 6 (six) months. 05/14/16  Yes Historical Provider, MD  DUREZOL 0.05 % EMUL  05/23/16  Yes Historical Provider, MD  enalapril (VASOTEC) 20 MG tablet Take 20 mg by mouth daily.   Yes Historical Provider, MD  insulin glargine (LANTUS) 100 UNIT/ML injection Inject 4 Units into the skin at bedtime. 05/14/16  Yes Historical Provider, MD  insulin lispro (HUMALOG) 100 UNIT/ML injection Inject into the skin 3 (three) times daily with meals.  06/21/14  Yes Historical Provider, MD  lovastatin (MEVACOR) 20 MG tablet Take 20 mg by mouth at bedtime.   Yes Historical Provider, MD  tamoxifen (NOLVADEX) 20 MG tablet Take 20 mg by mouth daily.   Yes Historical Provider, MD    Allergies Sulfa antibiotics and Lisinopril  Family History  Problem Relation Age of Onset  . Heart disease Father     heart attack  . Breast cancer Neg Hx     Social History Social History  Substance Use Topics  . Smoking status: Never Smoker  . Smokeless tobacco: Never Used  . Alcohol  use No    Review of Systems Constitutional: No fever/chills Eyes: No visual changes. ENT: No sore throat. Cardiovascular: Denies chest pain. Respiratory: Denies shortness of breath. Gastrointestinal: No abdominal pain. No diarrhea.  No constipation. Genitourinary: Negative for dysuria. Musculoskeletal: Negative for back pain. Skin: Negative for rash. Neurological: Negative for trouble speaking, focal weakness or numbness.  10-point ROS otherwise negative.  ____________________________________________   PHYSICAL  EXAM:  VITAL SIGNS: ED Triage Vitals [06/18/16 0906]  Enc Vitals Group     BP 114/60     Pulse Rate 74     Resp 18     Temp 98 F (36.7 C)     Temp Source Oral     SpO2 100 %     Weight 105 lb (47.6 kg)     Height 5\' 4"  (1.626 m)     Head Circumference      Peak Flow      Pain Score 0     Pain Loc      Pain Edu?      Excl. in [place]?     Constitutional: Alert and oriented. Fatigued and generally weak appearing  Eyes: Conjunctivae are slightly injected on the right, normal on the left. Evidence of surgical changes to the pupil on the right. Patient denies pain in the eyes. The right eye seems to demonstrate a intraocular pressure between 30-40 on multiple Tono-Pen readings. The left demonstrates approximately 20. Head: Atraumatic. Nose: No congestion/rhinnorhea. Mouth/Throat: Mucous membranes are moist.  Oropharynx non-erythematous. Neck: No stridor.   Cardiovascular: Normal rate, regular rhythm. Grossly normal heart sounds.  Good peripheral circulation. Respiratory: Normal respiratory effort.  No retractions. Lungs CTAB. Gastrointestinal: Soft and nontender. No distention.  Musculoskeletal: No lower extremity tenderness nor edema.  No joint effusions. Neurologic:  Normal speech and language. No gross focal neurologic deficits are appreciated. No pronator drift. Normal cranial nerves. Normal sensation in all extremities. Skin:  Skin is warm, dry and intact. No rash noted. Psychiatric: Mood and affect are normal. Speech and behavior are normal.  ____________________________________________   LABS (all labs ordered are listed, but only abnormal results are displayed)  Labs Reviewed  COMPREHENSIVE METABOLIC PANEL - Abnormal; Notable for the following:       Result Value   Sodium 120 (*)    Chloride 89 (*)    CO2 15 (*)    Glucose, Bld 370 (*)    BUN 37 (*)    Creatinine, Ser 1.30 (*)    Calcium 8.0 (*)    Total Protein 6.4 (*)    Alkaline Phosphatase 30 (*)    GFR calc  non Af Amer 39 (*)    GFR calc Af Amer 45 (*)    Anion gap 16 (*)    All other components within normal limits  CBC - Abnormal; Notable for the following:    WBC 12.8 (*)    MCH 34.7 (*)    All other components within normal limits  URINALYSIS COMPLETEWITH MICROSCOPIC ([facility] ONLY) - Abnormal; Notable for the following:    Color, Urine STRAW (*)    APPearance CLEAR (*)    Glucose, UA >500 (*)    Ketones, ur 2+ (*)    Hgb urine dipstick 2+ (*)    Squamous Epithelial / LPF 0-5 (*)    All other components within normal limits  GLUCOSE, CAPILLARY - Abnormal; Notable for the following:    Glucose-Capillary 364 (*)    All other components within normal limits  TROPONIN I - Abnormal; Notable for the following:    Troponin I 0.07 (*)    All other components within normal limits  BLOOD GAS, VENOUS - Abnormal; Notable for the following:    pH, Ven 7.27 (*)    pCO2, Ven 30 (*)    Bicarbonate 13.8 (*)    Acid-base deficit 11.8 (*)    All other components within normal limits  GLUCOSE, CAPILLARY - Abnormal; Notable for the following:    Glucose-Capillary 328 (*)    All other components within normal limits  GLUCOSE, CAPILLARY - Abnormal; Notable for the following:    Glucose-Capillary 306 (*)    All other components within normal limits  TROPONIN I - Abnormal; Notable for the following:    Troponin I 0.07 (*)    All other components within normal limits  LIPASE, BLOOD  OSMOLALITY, URINE  BASIC METABOLIC PANEL  BASIC METABOLIC PANEL  TROPONIN I  TROPONIN I  CBG MONITORING, ED  CBG MONITORING, ED  CBG MONITORING, ED   ____________________________________________  EKG Reviewed and interpreted by me at 9:40 AM Sinus rhythm A new T wave inversions noted in inferolateral distribution, concerning for possible ischemic change. QTC 500 QRS 90 PR 1:30 Reviewed and interpreted as sinus rhythm with new concerning T-wave abnormalities. However, the patient does not endorse any chest  symptoms.  ____________________________________________  RADIOLOGY  Ct Head Wo Contrast  Result Date: 06/18/2016 CLINICAL DATA:  Nausea, vomiting past 5 days. EXAM: CT HEAD WITHOUT CONTRAST TECHNIQUE: Contiguous axial images were obtained from the base of the skull through the vertex without intravenous contrast. COMPARISON:  02/15/2009 FINDINGS: There is atrophy and chronic small vessel disease changes. No acute intracranial abnormality. Specifically, no hemorrhage, hydrocephalus, mass lesion, acute infarction, or significant intracranial injury. No acute calvarial abnormality. Visualized paranasal sinuses and mastoids clear. Orbital soft tissues unremarkable. IMPRESSION: No acute intracranial abnormality. Atrophy, chronic microvascular disease. Electronically Signed   By: Rolm Baptise M.D.   On: 06/18/2016 12:04   ____________________________________________   PROCEDURES  Procedure(s) performed: None  Procedures  Critical Care performed: No  ____________________________________________   INITIAL IMPRESSION / ASSESSMENT AND PLAN / ED COURSE  Pertinent labs & imaging results that were available during my care of the patient were reviewed by me and considered in my medical decision making (see chart for details).  Patient with nausea vomiting, now with hyponatremia, EKG changes and a slightly elevated troponin. We will obtain a CT of the head to evaluate for intracranial hemorrhage, and also discussed with Dr. Edison Pace and placed a consultation to ophthalmology. He will see the patient between about noon and 1 PM today. Anticipate admission, suspect nausea and vomiting may be related to elevated intraocular pressure, postoperative course, infectious etiology, or potentially cardiac the patient doses no associated nausea or vomiting but her troponin is slightly elevated. Plan to give her aspirin if cleared by ophthalmology, as well as his CT is negative  Clinical Course     ----------------------------------------- 11:30 AM on 06/18/2016 -----------------------------------------  Discussed presentation, and intraocular pressures which on the right average about 30-40, on the left 20 with ophthalmologist Dr. Meredith Pel (spelling) at [facility] who provided her initial surgery this last week. He recommends ophthalmology consultation at [facility] regional from the on-call doctor at [facility] eye. ____________________________________________   FINAL CLINICAL IMPRESSION(S) / ED DIAGNOSES  Final diagnoses:  Dehydration  Hyponatremia  Intraocular pressure increase, right      NEW MEDICATIONS STARTED DURING THIS VISIT:  Current Discharge Medication List  Note:  This document was prepared using Dragon voice recognition software and may include unintentional dictation errors.     Delman Kitten, MD 06/18/16 [phone]

## 2016-06-18 NOTE — [place] Notes (Signed)
Notified physician about sliding scale and elevated blood sugar level. New order provided by Ulysees Barns.

## 2016-06-18 NOTE — ED Notes (Signed)
Patient transported to CT 

## 2016-06-18 NOTE — ED Notes (Signed)
Attempted to call report x1, nurse will call back, ascom number given

## 2016-06-18 NOTE — ED Triage Notes (Signed)
Pt presents with reports of vomiting for the past five days. Pt reports having had cataract surgery last week and has been sick since then. Pt reports is a diabetic and her sugar has been running in the high 300's. Pt has not been taking insulin due to the fact she has not been eating. Pt noted with fruity odor to breath.

## 2016-06-18 NOTE — H&P (Signed)
[facility] [place] NAME: [name] [name]    MR#:  [id number]  DATE OF BIRTH:  1941-06-01  DATE OF ADMISSION:  06/18/2016  PRIMARY CARE PHYSICIAN: Tracie Harrier, MD   REQUESTING/REFERRING PHYSICIAN: Quale, MD  CHIEF COMPLAINT:  Nausea, vomiting and dehydration  HISTORY OF PRESENT ILLNESS:  [name] [name]  is a 75 y.o. female with a known history of Insulin dependent diabetes mellitus, breast cancer status post mastectomy, history of right eye cataract status post surgery, supposed to get and the right eye surgery today is presenting to the ED with a chief complaint of intractable nausea vomiting and weakness. Patient reports that she has been nauseous and vomiting for the past 5 days. Denies any diarrhea. Denies any fever. Patient's sodium is at 120. EKG has revealed T-wave inversions in troponin is at 0.07. A right eye intraocular pressure is at 30-40 according to ED physician and he has notified that to on-call ophthalmologist Dr. Edison Pace who is going to evaluate the patient as soon as possible. During my examination patient denies any chest pain or shortness of breath. Husband at bedside  PAST MEDICAL HISTORY:   Past Medical History:  Diagnosis Date  . Breast cancer ([place]) 2011   RT MASTECTOMY  . Cancer ([place]) 2011   BREAST CA  . diabetes insulin dep   . High cholesterol   . Hypertension   . Hypothyroidism   . Osteoporosis   . Thyroid disease     PAST SURGICAL HISTOIRY:   Past Surgical History:  Procedure Laterality Date  . BREAST BIOPSY Right 2011   positive  . CATARACT EXTRACTION W/PHACO Right 06/13/2016   Procedure: CATARACT EXTRACTION PHACO AND INTRAOCULAR LENS PLACEMENT (IOC);  Surgeon: Estill Cotta, MD;  Location: [facility] ORS;  Service: Ophthalmology;  Laterality: Right;  [place] 03:07 AP% 27.4 CDE 93.21 fluid pack lot # [id number]  . MASTECTOMY Right 2011   positive  . TONSILLECTOMY    . TOTAL ABDOMINAL HYSTERECTOMY       SOCIAL HISTORY:   Social History  Substance Use Topics  . Smoking status: Never Smoker  . Smokeless tobacco: Never Used  . Alcohol use No    FAMILY HISTORY:   Family History  Problem Relation Age of Onset  . Heart disease Father     heart attack  . Breast cancer Neg Hx     DRUG ALLERGIES:   Allergies  Allergen Reactions  . Sulfa Antibiotics   . Lisinopril Rash    Hyponatremia     REVIEW OF SYSTEMS:  CONSTITUTIONAL: No fever, fatigue. Reporting  weakness.  EYES: No blurred or double vision.  EARS, NOSE, AND THROAT: No tinnitus or ear pain.  RESPIRATORY: No cough, shortness of breath, wheezing or hemoptysis.  CARDIOVASCULAR: No chest pain, orthopnea, edema.  GASTROINTESTINAL: Intractable nausea, vomiting, denies any  diarrhea or abdominal pain.  GENITOURINARY: No dysuria, hematuria.  ENDOCRINE: No polyuria, nocturia,  HEMATOLOGY: No anemia, easy bruising or bleeding SKIN: No rash or lesion. MUSCULOSKELETAL: No joint pain or arthritis.   NEUROLOGIC: No tingling, numbness, weakness.  PSYCHIATRY: No anxiety or depression.   MEDICATIONS AT HOME:   Prior to Admission medications   Medication Sig Start Date End Date Taking? Authorizing Provider  amLODipine (NORVASC) 5 MG tablet Take 1 tablet by mouth daily. 05/07/16  Yes Historical Provider, MD  calcium-vitamin D (OSCAL-500) 500-400 MG-UNIT per tablet Take 1 tablet by mouth daily.    Yes Historical Provider, MD  denosumab (PROLIA) 60  MG/ML SOLN injection Inject 60 mg into the skin every 6 (six) months. 05/14/16  Yes Historical Provider, MD  DUREZOL 0.05 % EMUL  05/23/16  Yes Historical Provider, MD  enalapril (VASOTEC) 20 MG tablet Take 20 mg by mouth daily.   Yes Historical Provider, MD  insulin glargine (LANTUS) 100 UNIT/ML injection Inject 4 Units into the skin at bedtime. 05/14/16  Yes Historical Provider, MD  insulin lispro (HUMALOG) 100 UNIT/ML injection Inject into the skin 3 (three) times daily with meals.  06/21/14   Yes Historical Provider, MD  lovastatin (MEVACOR) 20 MG tablet Take 20 mg by mouth at bedtime.   Yes Historical Provider, MD  tamoxifen (NOLVADEX) 20 MG tablet Take 20 mg by mouth daily.   Yes Historical Provider, MD      VITAL SIGNS:  Blood pressure 127/64, pulse 74, temperature 98 F (36.7 C), temperature source Oral, resp. rate 19, height 5\' 4"  (1.626 m), weight 47.6 kg (105 lb), SpO2 100 %.  PHYSICAL EXAMINATION:  GENERAL:  75 y.o.-year-old patient lying in the bed with no acute distress.  EYES: Pupil equal, round, reactive to light and accommodationOn left side. Right eye intraocular pressure is 30-40. No scleral icterus. Extraocular muscles intact.  HEENT: Head atraumatic, normocephalic. Oropharynx and nasopharynx clear. Dry mucous membranes NECK:  Supple, no jugular venous distention. No thyroid enlargement, no tenderness.  LUNGS: Normal breath sounds bilaterally, no wheezing, rales,rhonchi or crepitation. No use of accessory muscles of respiration.  CARDIOVASCULAR: S1, S2 normal. No murmurs, rubs, or gallops.  ABDOMEN: Soft, nontender, nondistended. Bowel sounds present. No organomegaly or mass.  EXTREMITIES: No pedal edema, cyanosis, or clubbing.  NEUROLOGIC: Cranial nerves II through XII are intact. Muscle strength 5/5 in all extremities. Sensation intact. Gait not checked.  PSYCHIATRIC: The patient is alert and oriented x 3.  SKIN: No obvious rash, lesion, or ulcer.   LABORATORY PANEL:   CBC  Recent Labs Lab 06/18/16 0915  WBC 12.8*  HGB 13.7  HCT 38.7  PLT 205   ------------------------------------------------------------------------------------------------------------------  Chemistries   Recent Labs Lab 06/18/16 0915  NA 120*  K 4.9  CL 89*  CO2 15*  GLUCOSE 370*  BUN 37*  CREATININE 1.30*  CALCIUM 8.0*  AST 31  ALT 17  ALKPHOS 30*  BILITOT 1.1    ------------------------------------------------------------------------------------------------------------------  Cardiac Enzymes  Recent Labs Lab 06/18/16 0914  TROPONINI 0.07*   ------------------------------------------------------------------------------------------------------------------  RADIOLOGY:  Ct Head Wo Contrast  Result Date: 06/18/2016 CLINICAL DATA:  Nausea, vomiting past 5 days. EXAM: CT HEAD WITHOUT CONTRAST TECHNIQUE: Contiguous axial images were obtained from the base of the skull through the vertex without intravenous contrast. COMPARISON:  02/15/2009 FINDINGS: There is atrophy and chronic small vessel disease changes. No acute intracranial abnormality. Specifically, no hemorrhage, hydrocephalus, mass lesion, acute infarction, or significant intracranial injury. No acute calvarial abnormality. Visualized paranasal sinuses and mastoids clear. Orbital soft tissues unremarkable. IMPRESSION: No acute intracranial abnormality. Atrophy, chronic microvascular disease. Electronically Signed   By: Rolm Baptise M.D.   On: 06/18/2016 12:04   EKG:   Orders placed or performed during the hospital encounter of 06/18/16  . ED EKG  . ED EKG    IMPRESSION AND PLAN:   [name] [name]  is a 75 y.o. female with a known history of Insulin dependent diabetes mellitus, breast cancer status post mastectomy, history of right eye cataract status post surgery, supposed to get and the right eye surgery today is presenting to the ED with a chief complaint of  intractable nausea vomiting and weakness. Patient reports that she has been nauseous and vomiting for the past 5 days. Denies any diarrhea. Denies any fever. Patient's sodium is at 120. EKG has revealed T-wave inversions in troponin is at 0.07. A right eye intraocular pressure is at 30-40 according to ED physician and he has notified that to on-call ophthalmologist Dr. Edison Pace  #Acute hyponatremia-from dehydration Provide hydration with IV  fluids Monitor renal function closely. Serial BMPs Check TSH and lipid panel Nephrology consult is placed Check urine osmolarity  # Acute gastritis with intractable nausea vomiting and dehydration Provide hydration with IV fluids Provide antiemetics and PPI  #Elevated troponin with inverted T waves Patient denies any chest pain Cycle troponins. 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 Edison Pace ophthalmologist has seen the patient. Considering outpatient surgical procedure once patient is medically stable Started patient on Cosopt, Vigamox, Alphagan and prednisone eyedrops  #Insulin requiring diabetes mellitus with elevated anion gap Monitor serial BMPs as she might go into DKA  Hydration with IV fluids and provide insulin   #History of right breast cancer status post mastectomy outpatient follow-up with oncology as recommended   DVT prophylaxis with Lovenox subcutaneous adjusted to renal dose     All the records are reviewed and case discussed with ED provider. Management plans discussed with the patient, family and they are in agreement.  CODE STATUS: fc/hysband  TOTAL TIME TAKING CARE OF THIS PATIENT: 50 minutes.   Note: This dictation was prepared with Dragon dictation along with smaller phrase technology. Any transcriptional errors that result from this process are unintentional.  Nicholes Mango M.D on 06/18/2016 at 1:46 PM  Between 7am to 6pm - Pager - [phone]  After 6pm go to [email or web address] - password [email or web address] [facility]  Office  [phone]  CC: Primary care physician; Tracie Harrier, MD

## 2016-06-19 ENCOUNTER — Other Ambulatory Visit: Payer: Self-pay

## 2016-06-19 LAB — COMPREHENSIVE METABOLIC PANEL
ALK PHOS: 25 U/L — AB (ref 38–126)
ALT: 14 U/L (ref 14–54)
AST: 23 U/L (ref 15–41)
Albumin: 3.3 g/dL — ABNORMAL LOW (ref 3.5–5.0)
Anion gap: 10 (ref 5–15)
BILIRUBIN TOTAL: 0.9 mg/dL (ref 0.3–1.2)
BUN: 23 mg/dL — ABNORMAL HIGH (ref 6–20)
CALCIUM: 7.4 mg/dL — AB (ref 8.9–10.3)
CO2: 17 mmol/L — ABNORMAL LOW (ref 22–32)
CREATININE: 0.86 mg/dL (ref 0.44–1.00)
Chloride: 101 mmol/L (ref 101–111)
Glucose, Bld: 240 mg/dL — ABNORMAL HIGH (ref 65–99)
Potassium: 4 mmol/L (ref 3.5–5.1)
Sodium: 128 mmol/L — ABNORMAL LOW (ref 135–145)
TOTAL PROTEIN: 5.4 g/dL — AB (ref 6.5–8.1)

## 2016-06-19 LAB — GLUCOSE, CAPILLARY
GLUCOSE-CAPILLARY: 244 mg/dL — AB (ref 65–99)
GLUCOSE-CAPILLARY: 270 mg/dL — AB (ref 65–99)
GLUCOSE-CAPILLARY: 300 mg/dL — AB (ref 65–99)
Glucose-Capillary: 289 mg/dL — ABNORMAL HIGH (ref 65–99)

## 2016-06-19 LAB — CBC
HCT: 35.7 % (ref 35.0–47.0)
Hemoglobin: 12.8 g/dL (ref 12.0–16.0)
MCH: 34.7 pg — ABNORMAL HIGH (ref 26.0–34.0)
MCHC: 35.8 g/dL (ref 32.0–36.0)
MCV: 97 fL (ref 80.0–100.0)
PLATELETS: 181 10*3/uL (ref 150–440)
RBC: 3.68 MIL/uL — AB (ref 3.80–5.20)
RDW: 13.7 % (ref 11.5–14.5)
WBC: 7.4 10*3/uL (ref 3.6–11.0)

## 2016-06-19 LAB — LIPID PANEL
CHOL/HDL RATIO: 3 ratio
CHOLESTEROL: 217 mg/dL — AB (ref 0–200)
HDL: 73 mg/dL (ref >40)
LDL Cholesterol: 114 mg/dL — ABNORMAL HIGH (ref 0–99)
Triglycerides: 152 mg/dL — ABNORMAL HIGH (ref <150)
VLDL: 30 mg/dL (ref 0–40)

## 2016-06-19 LAB — BASIC METABOLIC PANEL
ANION GAP: 6 (ref 5–15)
BUN: 17 mg/dL (ref 6–20)
CALCIUM: 7.3 mg/dL — AB (ref 8.9–10.3)
CO2: 22 mmol/L (ref 22–32)
CREATININE: 0.79 mg/dL (ref 0.44–1.00)
Chloride: 97 mmol/L — ABNORMAL LOW (ref 101–111)
GFR calc Af Amer: >60 mL/min (ref >60)
GLUCOSE: 287 mg/dL — AB (ref 65–99)
Potassium: 3.4 mmol/L — ABNORMAL LOW (ref 3.5–5.1)
Sodium: 125 mmol/L — ABNORMAL LOW (ref 135–145)

## 2016-06-19 LAB — MAGNESIUM: Magnesium: 2.2 mg/dL (ref 1.7–2.4)

## 2016-06-19 LAB — SODIUM
SODIUM: 124 mmol/L — AB (ref 135–145)
Sodium: 127 mmol/L — ABNORMAL LOW (ref 135–145)

## 2016-06-19 LAB — TROPONIN I: TROPONIN I: 0.07 ng/mL — AB (ref <0.03)

## 2016-06-19 LAB — HEMOGLOBIN A1C: HEMOGLOBIN A1C: 7.5 % — AB (ref 4.0–6.0)

## 2016-06-19 LAB — TSH: TSH: 6.721 u[IU]/mL — ABNORMAL HIGH (ref 0.350–4.500)

## 2016-06-19 MED ORDER — GLUCERNA SHAKE PO LIQD
237.0000 mL | Freq: Two times a day (BID) | ORAL | Status: DC
  Administered 2016-06-20: 237 mL via ORAL

## 2016-06-19 MED ORDER — ENOXAPARIN SODIUM 30 MG/0.3ML ~~LOC~~ SOLN
30.0000 mg | SUBCUTANEOUS | Status: DC
  Administered 2016-06-19: 30 mg via SUBCUTANEOUS
  Filled 2016-06-19: qty 0.3

## 2016-06-19 MED ORDER — DEXTROSE 5 % IV SOLN
INTRAVENOUS | Status: DC
  Administered 2016-06-19 – 2016-06-20 (×2): via INTRAVENOUS
  Filled 2016-06-19 (×4): qty 150

## 2016-06-19 MED ORDER — CIPROFLOXACIN HCL 250 MG PO TABS
250.0000 mg | ORAL_TABLET | Freq: Two times a day (BID) | ORAL | Status: AC
Start: 2016-06-19 — End: 2016-06-20
  Administered 2016-06-19 – 2016-06-20 (×4): 250 mg via ORAL
  Filled 2016-06-19 (×5): qty 1

## 2016-06-19 NOTE — Progress Notes (Addendum)
Inpatient Diabetes Program Recommendations  AACE/ADA: New Consensus Statement on Inpatient Glycemic Control (2015)  Target Ranges:  Prepandial:   less than 140 mg/dL      Peak postprandial:   less than 180 mg/dL (1-2 hours)      Critically ill patients:  140 - 180 mg/dL    Review of Glycemic Control  Diabetes history: DM1 Outpatient Diabetes medications: Lantus 4 units QHS, Humalog 2 units TID with meals plus 1 unit if CBG 250-300, 2 units if CBG 301-350 mg/dl, or 3 units if CBG > 351 mg/dl Current orders for Inpatient glycemic control: Lantus 4 units QHS, Novolog 0-9 units TID with meals  Inpatient Diabetes Program Recommendations:  Insulin - IV drip/GlucoStabilizer: In reviewing chart, note lab results on 06/18/16@9 :15 (CO2 15 and AG 30) and lab results on 06/18/16 @ 21:35 (CO2 12 and AG 13). CMET in process at this time. Please evaluate results of CMET to determine if patient is in DKA; if so patient will need to be transferred to ICU and placed on DKA order set using IV insulin. Correction (SSI): If patient is NOT in DKA, please consider changing CBG and Novolog frequency to Q4H. Insulin - Meal Coverage: If patient is NOT in DKA, please consider ordering Novolog 2 units TID with meals for meal coverage if patient eats at least 50% of meal.  NOTE: In reviewing the chart, note patient is followed by Dr. Gabriel Carina as an outpatient for diabetes management and she last seen Dr. Gabriel Carina on 05/14/16. Per Dr. Joycie Peek office note on 05/14/16 patient is fearful of high blood glucose levels and has frequent hypoglycemia; also noted that patient has dementia and is forgetful. Patient is prescribed same insulin regimen as noted above.  Thanks, Barnie Alderman, RN, MSN, CDE Diabetes Coordinator Inpatient Diabetes Program [phone] (Team Pager from [place] to [place]) [phone] ([facility] office) [phone] [facility] office) [phone] [facility] office)

## 2016-06-19 NOTE — Progress Notes (Signed)
Verified with lab that sodium level was drawn with 0446 am labs this morning. Lab confirmed that sodium level was 128, but has not been put into the computer yet. Dr. Juleen China at bedside and notified of current sodium level.

## 2016-06-19 NOTE — Care Management (Signed)
Admitted with dehydration with  sodium of 120.  Nephrology consult pending.  Presents from home and lives with her husband.  Independent in all adls, denies issues accessing medical care, obtaining medications or with transportation.  Current with her PCP.   No discharge needs identified at present by care manager or members of care team

## 2016-06-19 NOTE — Progress Notes (Signed)
[facility] at [place] NAME: [name] [name]    MR#:  [id number]  DATE OF BIRTH:  [birth year]  SUBJECTIVE:  CHIEF COMPLAINT:   Chief Complaint  Patient presents with  . Emesis   No further vomiting or diarrhea. Some nausea. No shortness of breath or chest pain. Feels weak.  REVIEW OF SYSTEMS:    Review of Systems  Constitutional: Positive for malaise/fatigue. Negative for chills and fever.  HENT: Negative for sore throat.   Eyes: Negative for blurred vision, double vision and pain.  Respiratory: Negative for cough, hemoptysis, shortness of breath and wheezing.   Cardiovascular: Negative for chest pain, palpitations, orthopnea and leg swelling.  Gastrointestinal: Negative for abdominal pain, constipation, diarrhea, heartburn, nausea and vomiting.  Genitourinary: Negative for dysuria and hematuria.  Musculoskeletal: Negative for back pain and joint pain.  Skin: Negative for rash.  Neurological: Positive for weakness. Negative for sensory change, speech change, focal weakness and headaches.  Endo/Heme/Allergies: Does not bruise/bleed easily.  Psychiatric/Behavioral: Negative for depression. The patient is not nervous/anxious.     DRUG ALLERGIES:   Allergies  Allergen Reactions  . Sulfa Antibiotics   . Lisinopril Rash    Hyponatremia     VITALS:  Blood pressure (!) 109/51, pulse 71, temperature 98.4 F (36.9 C), temperature source Oral, resp. rate 18, height 5\' 4"  (1.626 m), weight 42.6 kg (93 lb 14.4 oz), SpO2 97 %.  PHYSICAL EXAMINATION:   Physical Exam  GENERAL:  75 y.o.-year-old patient lying in the bed with no acute distress.  EYES: Pupils equal, round, reactive to light and accommodation. No scleral icterus. Extraocular muscles intact.  HEENT: Head atraumatic, normocephalic. Oropharynx and nasopharynx clear.  NECK:  Supple, no jugular venous distention. No thyroid enlargement, no tenderness.  LUNGS: Normal breath sounds  bilaterally, no wheezing, rales, rhonchi. No use of accessory muscles of respiration.  CARDIOVASCULAR: S1, S2 normal. No murmurs, rubs, or gallops.  ABDOMEN: Soft, nontender, nondistended. Bowel sounds present. No organomegaly or mass.  EXTREMITIES: No cyanosis, clubbing or edema b/l.    NEUROLOGIC: Cranial nerves II through XII are intact. No focal Motor or sensory deficits b/l.   PSYCHIATRIC: The patient is alert and oriented x 3.  SKIN: No obvious rash, lesion, or ulcer.   LABORATORY PANEL:   CBC  Recent Labs Lab 06/19/16 0446  WBC 7.4  HGB 12.8  HCT 35.7  PLT 181   ------------------------------------------------------------------------------------------------------------------ Chemistries   Recent Labs Lab 06/19/16 0446 06/19/16 1125  NA 128* 127*  K 4.0  --   CL 101  --   CO2 17*  --   GLUCOSE 240*  --   BUN 23*  --   CREATININE 0.86  --   CALCIUM 7.4*  --   AST 23  --   ALT 14  --   ALKPHOS 25*  --   BILITOT 0.9  --    ------------------------------------------------------------------------------------------------------------------  Cardiac Enzymes  Recent Labs Lab 06/19/16 0446  TROPONINI 0.07*   ------------------------------------------------------------------------------------------------------------------  RADIOLOGY:  Ct Head Wo Contrast  Result Date: 06/18/2016 CLINICAL DATA:  Nausea, vomiting past 5 days. EXAM: CT HEAD WITHOUT CONTRAST TECHNIQUE: Contiguous axial images were obtained from the base of the skull through the vertex without intravenous contrast. COMPARISON:  02/15/2009 FINDINGS: There is atrophy and chronic small vessel disease changes. No acute intracranial abnormality. Specifically, no hemorrhage, hydrocephalus, mass lesion, acute infarction, or significant intracranial injury. No acute calvarial abnormality. Visualized paranasal sinuses and mastoids clear. Orbital  soft tissues unremarkable. IMPRESSION: No acute intracranial  abnormality. Atrophy, chronic microvascular disease. Electronically Signed   By: Rolm Baptise M.D.   On: 06/18/2016 12:04    ASSESSMENT AND PLAN:   [name] [name]  is a 75 y.o. female with a known history of Insulin dependent diabetes mellitus, breast cancer status post mastectomy, history of right eye cataract status post surgery, supposed to get and the right eye surgery 06/18/2016 is presented to the ED with a chief complaint of intractable nausea vomiting and weakness  #Acute hyponatremia-from dehydration Provide hydration with IV fluids Monitor renal function closely. Serial BMPs Nephrology input appreciated Due to metabolic acidosis she is on bicarb drip.  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 Edison Pace ophthalmologist has seen the patient. Considering outpatient surgical procedure once patient is medically stable Started patient on Cosopt, Vigamox, Alphagan and prednisone eyedrops  # Insulin requiring diabetes mellitus  SSI  #History of right breast cancer status post mastectomy outpatient follow-up with oncology as recommended   DVT prophylaxis with Lovenox subcutaneous adjusted to renal dose  All the records are reviewed and case discussed with Care Management/Social Workerr. Management plans discussed with the patient, family and they are in agreement.  CODE STATUS: FULL CODE  DVT Prophylaxis: SCDs  TOTAL TIME TAKING CARE OF THIS PATIENT: 35 minutes.   POSSIBLE D/C IN 1-2 DAYS, DEPENDING ON CLINICAL CONDITION.  [name] R M.D on 06/19/2016 at 1:53 PM  Between 7am to 6pm - Pager - [phone]  After 6pm go to [email or web address] - password [email or web address] [facility]  Office   [phone]  CC: Primary care physician; Tracie Harrier, MD  Note: This dictation was prepared with Dragon dictation along with smaller phrase technology. Any transcriptional errors that result from this process are unintentional.

## 2016-06-19 NOTE — Progress Notes (Signed)
Subjective: No acute events.  Overall [name] [name] is feeling better, tolerating foods.  No eye pain.  Vision in the right eye is not as good as the left eye.  Getting the eye drops fine.  Objective: Vital signs in last 24 hours: Temp:  [97.8 F (36.6 C)-98.4 F (36.9 C)] 98.4 F (36.9 C) (08/01 1116) Pulse Rate:  [69-76] 71 (08/01 1116) Resp:  [16-18] 18 (08/01 1116) BP: (109-129)/(42-52) 109/51 (08/01 1116) SpO2:  [96 %-99 %] 97 % (08/01 1116) Weight:  [47.1 kg (103 lb 12.8 oz)] 47.1 kg (103 lb 12.8 oz) (08/01 1509)   . aspirin EC  325 mg Oral Daily  . brimonidine  1 drop Right Eye TID  . ciprofloxacin  250 mg Oral BID  . dorzolamide-timolol  1 drop Right Eye BID  . enoxaparin (LOVENOX) injection  30 mg Subcutaneous Q24H  . feeding supplement (GLUCERNA SHAKE)  237 mL Oral BID AC & HS  . insulin aspart  0-9 Units Subcutaneous TID WC  . insulin glargine  4 Units Subcutaneous QHS  . moxifloxacin  1 drop Right Eye QID  . prednisoLONE acetate  1 drop Right Eye QID  . sodium chloride flush  3 mL Intravenous Q12H     General: conversational, pleasant, sitting up in bed.  Mental status: Alert and Oriented x 4  Visual Acuity:  20/100 OD  20/80+ near [place]  Pupils:  Right is peaked superiorly, reactive to light.  No Afferent defect.  Motility:  Full/ orthophoric  Visual Fields:  Full to confrontation  IOP:  OD: 36 mm by tonopen.  OS: 14 mm.  External/ Lids/ Lashes:  Normal  Portable handheld slitlamp:  Anterior Segment:  Conjunctiva:  Normal  OU  Cornea:  Mild edema OD, improved compared to yesterday.  Anterior Chamber: Grossly Normal  OU, anterior chamber IOL OD.    Recent Labs  06/18/16 0915 06/18/16 2135 06/19/16 0446 06/19/16 1125  WBC 12.8*  --  7.4  --   HGB 13.7  --  12.8  --   HCT 38.7  --  35.7  --   NA 120* 123* 128* 127*  K 4.9 4.0 4.0  --   CL 89* 98* 101  --   CO2 15* 12* 17*  --   BUN 37* 27* 23*  --   CREATININE 1.30* 0.98 0.86  --      Studies/Results: Ct Head Wo Contrast  Result Date: 06/18/2016 CLINICAL DATA:  Nausea, vomiting past 5 days. EXAM: CT HEAD WITHOUT CONTRAST TECHNIQUE: Contiguous axial images were obtained from the base of the skull through the vertex without intravenous contrast. COMPARISON:  02/15/2009 FINDINGS: There is atrophy and chronic small vessel disease changes. No acute intracranial abnormality. Specifically, no hemorrhage, hydrocephalus, mass lesion, acute infarction, or significant intracranial injury. No acute calvarial abnormality. Visualized paranasal sinuses and mastoids clear. Orbital soft tissues unremarkable. IMPRESSION: No acute intracranial abnormality. Atrophy, chronic microvascular disease. Electronically Signed   By: Rolm Baptise M.D.   On: 06/18/2016 12:04    Assessment/Plan:   LOS: 1 day  Assessment/Plan: Overall, improving.  Intraocular pressure elevated but stable.   1.  Multiple medical issues: treated by hospitalists, appreciate their management of this complex medical patient.  2.  Retained lens fragments in the right eye following cataract surgery.  Increased intraocular pressure, but stable.  After patient is stabilized from medical issues and discharged, patient will need to have outpatient ocular surgery by a retina specialist at [facility].  Recommended  to patient's husband to call back [facility] to reschedule vitrectomy with removal of lens fragments for Monday 8/7 assuming that she will be discharged by then.  Continue the following medications until the time of her surgery:  (topical glaucoma drops, antibiotic and steroid)  -- prednisilone acetate 1% four times per day in the right eye. -- moxifloxacin four times per day right eye. -- brimonidine 3 times per day right eye. -- dorzolamide/timolol 2 times per day right eye.  I will continue to follow the patient.   Benay Pillow 8/1/20176:00 PM

## 2016-06-19 NOTE — Consult Note (Signed)
[facility]  CONSULT NOTE    Date: 06/19/2016                  Patient Name:  PEYSON COPADO  MRN: [id number]  DOB: [birth year]  Age / Sex: 75 y.o., female         PCP: Tracie Harrier, MD                 Service Requesting Consult: Dr. Darvin Neighbours                 Reason for Consult: Hyponatremia and metabolic acidosis            History of Present Illness: Ms. BERLYNN PADGET is a 75 y.o. white female with hypothyroidism, hypertension, hyperlipidemia, diabetes mellitus type II, history of breast cancer and hysterectomy , who was admitted to [facility] on 06/18/2016 for Dehydration [E86.0] Hyponatremia [E87.1] Intraocular pressure increase, right [H40.051]  Patient has been having several days of nausea and vomiting. She has not been on any diuretics. She states that her glucose levels have been well controlled. Denies any lightheadedness or dizziness.    Medications: Outpatient medications: Prescriptions Prior to Admission  Medication Sig Dispense Refill Last Dose  . amLODipine (NORVASC) 5 MG tablet Take 1 tablet by mouth daily.   unknown at unknown  . calcium-vitamin D (OSCAL-500) 500-400 MG-UNIT per tablet Take 1 tablet by mouth daily.    unknown at unknown  . denosumab (PROLIA) 60 MG/ML SOLN injection Inject 60 mg into the skin every 6 (six) months.   unknown at unknown  . DUREZOL 0.05 % EMUL    unknown at unknown  . enalapril (VASOTEC) 20 MG tablet Take 20 mg by mouth daily.    at unknown  . insulin glargine (LANTUS) 100 UNIT/ML injection Inject 4 Units into the skin at bedtime.   unknown at unknown  . insulin lispro (HUMALOG) 100 UNIT/ML injection Inject into the skin 3 (three) times daily with meals.    unknown at unknown  . lovastatin (MEVACOR) 20 MG tablet Take 20 mg by mouth at bedtime.   unknown at unknown  . tamoxifen (NOLVADEX) 20 MG tablet Take 20 mg by mouth daily.   unknown at unknown    Current medications: Current Facility-Administered Medications   Medication Dose Route Frequency Provider Last Rate Last Dose  . aspirin EC tablet 325 mg  325 mg Oral Daily Nicholes Mango, MD   325 mg at 06/18/16 1711  . brimonidine (ALPHAGAN) 0.2 % ophthalmic solution 1 drop  1 drop Right Eye TID Nicholes Mango, MD   1 drop at 06/19/16 1014  . dorzolamide-timolol (COSOPT) 22.3-6.8 MG/ML ophthalmic solution 1 drop  1 drop Right Eye BID Nicholes Mango, MD   1 drop at 06/19/16 1014  . enoxaparin (LOVENOX) injection 30 mg  30 mg Subcutaneous Q24H Nicholes Mango, MD   30 mg at 06/18/16 1716  . insulin aspart (novoLOG) injection 0-9 Units  0-9 Units Subcutaneous TID WC Dustin Flock, MD   3 Units at 06/19/16 0835  . insulin glargine (LANTUS) injection 4 Units  4 Units Subcutaneous QHS Dustin Flock, MD   4 Units at 06/18/16 2220  . moxifloxacin (VIGAMOX) 0.5 % ophthalmic solution 1 drop  1 drop Right Eye QID Nicholes Mango, MD   1 drop at 06/19/16 1014  . prednisoLONE acetate (PRED FORTE) 1 % ophthalmic suspension 1 drop  1 drop Right Eye QID Nicholes Mango, MD   1 drop at 06/19/16 1014  .  sodium bicarbonate 150 mEq in dextrose 5 % 1,000 mL infusion   Intravenous Continuous Lavonia Dana, MD 50 mL/hr at 06/19/16 0840    . sodium chloride flush (NS) 0.9 % injection 3 mL  3 mL Intravenous Q12H Nicholes Mango, MD   3 mL at 06/19/16 0900      Allergies: Allergies  Allergen Reactions  . Sulfa Antibiotics   . Lisinopril Rash    Hyponatremia       Past Medical History: Past Medical History:  Diagnosis Date  . Breast cancer ([place]) 2011   RT MASTECTOMY  . Cancer ([place]) 2011   BREAST CA  . diabetes insulin dep   . High cholesterol   . Hypertension   . Hypothyroidism   . Osteoporosis   . Thyroid disease      Past Surgical History: Past Surgical History:  Procedure Laterality Date  . BREAST BIOPSY Right 2011   positive  . CATARACT EXTRACTION W/PHACO Right 06/13/2016   Procedure: CATARACT EXTRACTION PHACO AND INTRAOCULAR LENS PLACEMENT (IOC);  Surgeon: Estill Cotta,  MD;  Location: [facility] ORS;  Service: Ophthalmology;  Laterality: Right;  [place] 03:07 AP% 27.4 CDE 93.21 fluid pack lot # [id number]  . MASTECTOMY Right 2011   positive  . TONSILLECTOMY    . TOTAL ABDOMINAL HYSTERECTOMY       Family History: Family History  Problem Relation Age of Onset  . Heart disease Father     heart attack  . Breast cancer Neg Hx      Social History: Social History   Social History  . Marital status: Married    Spouse name: N/A  . Number of children: N/A  . Years of education: N/A   Occupational History  . Not on file.   Social History Main Topics  . Smoking status: Never Smoker  . Smokeless tobacco: Never Used  . Alcohol use No  . Drug use: No  . Sexual activity: Not on file   Other Topics Concern  . Not on file   Social History Narrative  . No narrative on file     Review of Systems: Review of Systems  Constitutional: Negative.  Negative for chills, diaphoresis, fever, malaise/fatigue and weight loss.  HENT: Negative.  Negative for congestion, ear discharge, ear pain, hearing loss, nosebleeds, sore throat and tinnitus.   Eyes: Negative.  Negative for blurred vision, double vision, photophobia, pain, discharge and redness.  Respiratory: Negative.  Negative for cough, hemoptysis, sputum production, shortness of breath, wheezing and stridor.   Cardiovascular: Negative.  Negative for chest pain, palpitations, orthopnea, claudication, leg swelling and PND.  Gastrointestinal: Positive for heartburn, nausea and vomiting. Negative for abdominal pain, blood in stool, constipation, diarrhea and melena.  Genitourinary: Negative.  Negative for dysuria, flank pain, frequency, hematuria and urgency.  Musculoskeletal: Negative.  Negative for back pain, falls, joint pain, myalgias and neck pain.  Skin: Negative.  Negative for itching and rash.  Neurological: Negative.  Negative for dizziness, tingling, tremors, sensory change, speech change, focal weakness,  seizures, loss of consciousness, weakness and headaches.  Endo/Heme/Allergies: Negative.  Negative for environmental allergies and polydipsia. Does not bruise/bleed easily.  Psychiatric/Behavioral: Positive for memory loss. Negative for depression, hallucinations, substance abuse and suicidal ideas. The patient is not nervous/anxious and does not have insomnia.     Vital Signs: Blood pressure (!) 129/52, pulse 69, temperature 97.8 F (36.6 C), temperature source Oral, resp. rate 16, height 5\' 4"  (1.626 m), weight 42.6 kg (93 lb 14.4 oz), SpO2  99 %.  Weight trends: Filed Weights   06/18/16 0906 06/18/16 1510  Weight: 47.6 kg (105 lb) 42.6 kg (93 lb 14.4 oz)    Physical Exam: General: NAD, laying in bed  Head: Normocephalic, atraumatic. Dry oral mucosal membranes  Eyes: Anicteric, PERRL  Neck: Supple, trachea midline  Lungs:  Clear to auscultation  Heart: Regular rate and rhythm  Abdomen:  Soft, nontender,   Extremities: no peripheral edema.  Neurologic: Nonfocal, moving all four extremities  Skin: No lesions        Lab results: Basic Metabolic Panel:  Recent Labs Lab 06/18/16 0915 06/18/16 2135  NA 120* 123*  K 4.9 4.0  CL 89* 98*  CO2 15* 12*  GLUCOSE 370* 288*  BUN 37* 27*  CREATININE 1.30* 0.98  CALCIUM 8.0* 7.1*    Liver Function Tests:  Recent Labs Lab 06/18/16 0915  AST 31  ALT 17  ALKPHOS 30*  BILITOT 1.1  PROT 6.4*  ALBUMIN 4.0    Recent Labs Lab 06/18/16 0915  LIPASE 15   No results for input(s): AMMONIA in the last 168 hours.  CBC:  Recent Labs Lab 06/18/16 0915 06/19/16 0446  WBC 12.8* 7.4  HGB 13.7 12.8  HCT 38.7 35.7  MCV 97.8 97.0  PLT 205 181    Cardiac Enzymes:  Recent Labs Lab 06/18/16 0914 06/18/16 1528 06/18/16 2135 06/19/16 0446  TROPONINI 0.07* 0.07* 0.06* 0.07*    BNP: Invalid input(s): POCBNP  CBG:  Recent Labs Lab 06/18/16 0910 06/18/16 1218 06/18/16 1324 06/18/16 2100 06/19/16 0730  GLUCAP  364* 328* 306* 291* 244*    Microbiology: No results found for this or any previous visit.  Coagulation Studies: No results for input(s): LABPROT, INR in the last 72 hours.  Urinalysis:  Recent Labs  06/18/16 0950  COLORURINE STRAW*  LABSPEC 1.016  PHURINE 5.0  GLUCOSEU >500*  HGBUR 2+*  BILIRUBINUR NEGATIVE  KETONESUR 2+*  PROTEINUR NEGATIVE  NITRITE NEGATIVE  LEUKOCYTESUR NEGATIVE      Imaging: Ct Head Wo Contrast  Result Date: 06/18/2016 CLINICAL DATA:  Nausea, vomiting past 5 days. EXAM: CT HEAD WITHOUT CONTRAST TECHNIQUE: Contiguous axial images were obtained from the base of the skull through the vertex without intravenous contrast. COMPARISON:  02/15/2009 FINDINGS: There is atrophy and chronic small vessel disease changes. No acute intracranial abnormality. Specifically, no hemorrhage, hydrocephalus, mass lesion, acute infarction, or significant intracranial injury. No acute calvarial abnormality. Visualized paranasal sinuses and mastoids clear. Orbital soft tissues unremarkable. IMPRESSION: No acute intracranial abnormality. Atrophy, chronic microvascular disease. Electronically Signed   By: Rolm Baptise M.D.   On: 06/18/2016 12:04     Assessment & Plan: Ms. MARIAVICTORIA ZIKA is a 75 y.o. white female with hypothyroidism, hypertension, hyperlipidemia, diabetes mellitus type II, history of breast cancer and hysterectomy , who was admitted to [facility] on 06/18/2016  1. Hyponatremia: acute on chronic 2. Metabolic Acidosis 3. Chronic Kidney Disease stage III: age related. GFR of 54.  4. Diabetes Mellitus type II with chronic kidney disease: insulin dependent. With glucosuria.   Plan Nausea and vomiting leading to the loss of hydrochloric acid can give a hyponatremia and metabolic acidosis picture.  - Started on IV sodium bicarbonate today.  - Serial sodium ehcks.    LOS: [place], Jamear Carbonneau 8/1/201711:00 AM

## 2016-06-19 NOTE — Progress Notes (Signed)
Initial Nutrition Assessment  DOCUMENTATION CODES:   Not applicable  INTERVENTION:  -Recommend addition of Glucerna BID with meals -Encouraged pt to not skip meals, encouraged pt eat something at least every 4-5 hours -Request new weight  NUTRITION DIAGNOSIS:   Inadequate oral intake related to nausea, vomiting as evidenced by per patient/family report.  GOAL:   Patient will meet greater than or equal to 90% of their needs  MONITOR:   PO intake, Supplement acceptance, Weight trends, Labs  REASON FOR ASSESSMENT:   Malnutrition Screening Tool    ASSESSMENT:    75 yo female admitted with N/V x 5 days, dehydration, acute hyponatremia; pt with recent eye surgery with elevated intraocular eye pressure,opthalmologist consulted. Pt with hx of Type I DM, breast cancer s/p mastectomy  Husband at bedside; Pt reports inability to eat anything for the past 5 days due to N/V; pt has only eaten a little jello since Thursday of last week. Pt did tolerate breakfast tray this AM without N/V. Prior to this, pt reports eating 2 meals per day, typically only has appetite for breakfast meal but makes her self eat something for dinner. Pt typically eats biscuit with jelly, sausage, applesauce and coffee at breakfast, pt does not eat lunch, eats dinner meal which varies (sometimes a sandwich, sometimes a meat with sides).   Pt checks finger sticks 4 times daily, they report that prior to her eye surgery, FSBS 160s or below. Recently FSBS 300 or greater. Pt does not report blood sugar issues when skipping lunch meal. Per diabetes coordinator, pt followed by MD Solum as outpatient and is fearful of high blood sugar and has frequent hypoglycemia. Pt does not use nutritional supplements  Husband reports pt UBW is between 105-110 pounds; weight encounters up until this admission reflect this trend. Current recorded weight 93 pounds; will request new weight.   Nutrition-Focused physical exam completed.  Findings are no fat depletion, mild muscle depletion, and milf edema.    Past Medical History:  Diagnosis Date  . Breast cancer ([place]) 2011   RT MASTECTOMY  . Cancer ([place]) 2011   BREAST CA  . diabetes insulin dep   . High cholesterol   . Hypertension   . Hypothyroidism   . Osteoporosis   . Thyroid disease      Diet Order:  Diet Carb Modified Fluid consistency: Thin; Room service appropriate? Yes  Energy Intake: no recorded po intake but pt reports she ate some breakfast this AM and tolerated without N/V; husband reports that is the best meal she has eaten in 5 days.   Skin:  Reviewed, no issues  Last BM:  7/31   Labs: sodium 127  Glucose Profile:   Recent Labs  06/18/16 2100 06/19/16 0730 06/19/16 1119  GLUCAP 291* 244* 270*    Meds: ss novolog, lantus, sodium bicarb drip at 50 ml/hr  Height:   Ht Readings from Last 1 Encounters:  06/18/16 5\' 4"  (1.626 m)    Weight:   Wt Readings from Last 1 Encounters:  06/18/16 93 lb 14.4 oz (42.6 kg)   Filed Weights   06/18/16 0906 06/18/16 1510  Weight: 105 lb (47.6 kg) 93 lb 14.4 oz (42.6 kg)    Wt Readings from Last 10 Encounters:  06/18/16 93 lb 14.4 oz (42.6 kg)  06/04/16 107 lb (48.5 kg)  05/28/16 108 lb 12.8 oz (49.3 kg)  04/26/16 106 lb 11.2 oz (48.4 kg)  10/27/15 108 lb 0.4 oz (49 kg)  04/26/15 106 lb  4.2 oz (48.2 kg)  03/01/14 105 lb (47.6 kg)  08/24/13 109 lb 9.6 oz (49.7 kg)    BMI:  Body mass index is 16.12 kg/m.  Estimated Nutritional Needs:   Kcal:  1300-1600 kcals   Protein:  65-75 g  Fluid:  >/= 1.5 L  EDUCATION NEEDS:   Education needs addressed  Kerman Passey MS, [place], LDN [phone] Pager  [phone] Weekend/On-Call Pager

## 2016-06-19 NOTE — Progress Notes (Signed)
Dr. Margaretmary Eddy paged about patient having 5 beats of V-tach. MD to put in new orders. Will continue to monitor.

## 2016-06-19 NOTE — Progress Notes (Signed)
No complaints of pain, vital signs are stable. Sodium bicarbonate started today. Continuing to monitor sodium levels.

## 2016-06-20 LAB — BASIC METABOLIC PANEL
Anion gap: 5 (ref 5–15)
BUN: 9 mg/dL (ref 6–20)
CALCIUM: 7.1 mg/dL — AB (ref 8.9–10.3)
CO2: 27 mmol/L (ref 22–32)
Chloride: 96 mmol/L — ABNORMAL LOW (ref 101–111)
Creatinine, Ser: 0.6 mg/dL (ref 0.44–1.00)
GFR calc Af Amer: >60 mL/min (ref >60)
GLUCOSE: 215 mg/dL — AB (ref 65–99)
Potassium: 2.8 mmol/L — ABNORMAL LOW (ref 3.5–5.1)
Sodium: 128 mmol/L — ABNORMAL LOW (ref 135–145)

## 2016-06-20 LAB — GLUCOSE, CAPILLARY
GLUCOSE-CAPILLARY: 260 mg/dL — AB (ref 65–99)
Glucose-Capillary: 211 mg/dL — ABNORMAL HIGH (ref 65–99)
Glucose-Capillary: 242 mg/dL — ABNORMAL HIGH (ref 65–99)
Glucose-Capillary: 206 mg/dL — ABNORMAL HIGH (ref 65–99)

## 2016-06-20 LAB — SODIUM: SODIUM: 130 mmol/L — AB (ref 135–145)

## 2016-06-20 MED ORDER — POTASSIUM CHLORIDE CRYS ER 20 MEQ PO TBCR
40.0000 meq | EXTENDED_RELEASE_TABLET | ORAL | Status: AC
  Administered 2016-06-20 (×2): 40 meq via ORAL
  Filled 2016-06-20 (×2): qty 2

## 2016-06-20 MED ORDER — ENOXAPARIN SODIUM 40 MG/0.4ML ~~LOC~~ SOLN
40.0000 mg | SUBCUTANEOUS | Status: DC
  Filled 2016-06-20: qty 0.4

## 2016-06-20 MED ORDER — INSULIN ASPART 100 UNIT/ML ~~LOC~~ SOLN
3.0000 [IU] | Freq: Three times a day (TID) | SUBCUTANEOUS | Status: DC
  Administered 2016-06-20 – 2016-06-21 (×3): 3 [IU] via SUBCUTANEOUS
  Filled 2016-06-20 (×3): qty 3

## 2016-06-20 MED ORDER — POTASSIUM CHLORIDE IN NACL 40-0.9 MEQ/L-% IV SOLN
INTRAVENOUS | Status: DC
  Administered 2016-06-20: 75 mL/h via INTRAVENOUS
  Filled 2016-06-20 (×2): qty 1000

## 2016-06-20 MED ORDER — ONDANSETRON HCL 4 MG/2ML IJ SOLN: 4.0000 mg | Freq: Four times a day (QID) | INTRAMUSCULAR | Status: DC | PRN

## 2016-06-20 NOTE — Evaluation (Signed)
Physical Therapy Evaluation Patient Details Name: [name] [name] MRN: [id number] DOB: 17-Mar-1941 Today's Date: 06/20/2016   History of Present Illness  Pt is a 75 y.o. female presenting to hospital with nausea/vomiting and weakness after cataract surgery 06/13/16.  Pt admitted with hyponatremia, dehydration, metabolic acidosis, and elevated R intraocular pressure (retained lens fragments in R eye following cataract surgery).  PMH includes DM, htn, breast CA s/p mastectomy.  Clinical Impression  Prior to admission, pt was independent without AD.  Pt lives with her husband in 1 level home with 3-4 stairs to enter.  Currently pt demonstrates balance impairments and generalized weakness requiring RW for optimal steadiness and safety with functional mobility (trialed RW vs SPC).  Pt would benefit from skilled PT to address noted impairments and functional limitations and continue to progress pt with balance and ambulation with least restrictive AD.  Currently recommend pt discharge to home with RW, HHPT, and SBA for functional mobility.     Follow Up Recommendations Home health PT;Supervision for mobility/OOB    Equipment Recommendations  Rolling walker with 5" wheels    Recommendations for Other Services       Precautions / Restrictions Precautions Precautions: Fall Precaution Comments: No bending over or lifting (prevent increase in intraocular pressure) Restrictions Weight Bearing Restrictions: No      Mobility  Bed Mobility Overal bed mobility: Modified Independent             General bed mobility comments: Supine to sit with HOB elevated  Transfers Overall transfer level: Needs assistance Equipment used: None Transfers: Sit to/from Stand Sit to Stand: Min guard         General transfer comment: pt stood fairly well (without AD) but required UE support on bed rail for initial standing balance; pt steady standing from chair with use of RW (mildly unsteady with  SPC)  Ambulation/Gait Ambulation/Gait assistance: Min guard;Min assist Ambulation Distance (Feet):  (160 feet (IV pole use); 40 feet (RW use); 40 feet (SPC use)) Assistive device:  (IV pole vs RW vs SPC)       General Gait Details: pt requiring UE support for ambulation for balance and initially holding onto IV pole with B UE's then transitioned to single UE support on IV pole with vc's for increasing cadence, step length, and BOS; trialed RW and pt steady without loss of balance and good cadence (vc's for RW use and gait technique required initially); trialed SPC and pt mildly unsteady with CGA and decreased cadence (vc's for SPC use and gait technique required)  Stairs            Wheelchair Mobility    Modified Rankin (Stroke Patients Only)       Balance Overall balance assessment: Needs assistance Sitting-balance support: Bilateral upper extremity supported;Feet supported Sitting balance-Leahy Scale: Good     Standing balance support: No upper extremity supported;During functional activity Standing balance-Leahy Scale: Poor Standing balance comment: unsteady with loss of balance to R without UE support; CGA with ambulation with RW                             Pertinent Vitals/Pain Pain Assessment: No/denies pain  See flow sheet for HR and O2 vitals.    Home Living Family/patient expects to be discharged to:: Private residence Living Arrangements: Spouse/significant other Available Help at Discharge: Family Type of Home: House Home Access: Stairs to enter Entrance Stairs-Rails: Left Entrance Stairs-Number of Steps: 3-4 Home  Layout: One level Home Equipment: None      Prior Function Level of Independence: Independent         Comments: Pt denies any falls in past 6 months.     Hand Dominance        Extremity/Trunk Assessment   Upper Extremity Assessment: Generalized weakness           Lower Extremity Assessment: Generalized  weakness         Communication   Communication: HOH  Cognition Arousal/Alertness: Awake/alert Behavior During Therapy: WFL for tasks assessed/performed Overall Cognitive Status: Within Functional Limits for tasks assessed                      General Comments General comments (skin integrity, edema, etc.): Pt laying in bed with husband present beginning of session (husband left during session and periodically stopped by during session).  Nursing cleared pt for participation in physical therapy.  Pt agreeable to PT session.     Exercises  Gait training (see above).      Assessment/Plan    PT Assessment Patient needs continued PT services  PT Diagnosis Difficulty walking;Generalized weakness   PT Problem List Decreased strength;Decreased activity tolerance;Decreased balance;Decreased mobility;Decreased knowledge of use of DME  PT Treatment Interventions DME instruction;Gait training;Stair training;Functional mobility training;Therapeutic activities;Therapeutic exercise;Balance training;Patient/family education   PT Goals (Current goals can be found in the Care Plan section) Acute Rehab PT Goals Patient Stated Goal: to go home PT Goal Formulation: With patient Time For Goal Achievement: 07/04/16 Potential to Achieve Goals: Good    Frequency Min 2X/week   Barriers to discharge        Co-evaluation               End of Session Equipment Utilized During Treatment: Gait belt Activity Tolerance: Patient tolerated treatment well Patient left: in chair;with call bell/phone within reach;with chair alarm set;with family/visitor present Nurse Communication: Mobility status;Precautions         Time: [id number] PT Time Calculation (min) (ACUTE ONLY): 34 min   Charges:   PT Evaluation $PT Eval Low Complexity: 1 Procedure PT Treatments $Gait Training: 8-22 mins   PT G CodesLeitha Bleak 07/14/16, 1:17 PM Leitha Bleak, [place]

## 2016-06-20 NOTE — Progress Notes (Signed)
[place] [facility] [place] NAME: [name] [name]    MR#:  [id number]  DATE OF BIRTH:  1941/02/23  SUBJECTIVE:  CHIEF COMPLAINT:   Chief Complaint  Patient presents with  . Emesis   No further vomiting. Has lot of nausea. feels extremely weak.  REVIEW OF SYSTEMS:    Review of Systems  Constitutional: Positive for malaise/fatigue. Negative for chills and fever.  HENT: Negative for sore throat.   Eyes: Negative for blurred vision, double vision and pain.  Respiratory: Negative for cough, hemoptysis, shortness of breath and wheezing.   Cardiovascular: Negative for chest pain, palpitations, orthopnea and leg swelling.  Gastrointestinal: Negative for abdominal pain, constipation, diarrhea, heartburn, nausea and vomiting.  Genitourinary: Negative for dysuria and hematuria.  Musculoskeletal: Negative for back pain and joint pain.  Skin: Negative for rash.  Neurological: Positive for weakness. Negative for sensory change, speech change, focal weakness and headaches.  Endo/Heme/Allergies: Does not bruise/bleed easily.  Psychiatric/Behavioral: Negative for depression. The patient is not nervous/anxious.     DRUG ALLERGIES:   Allergies  Allergen Reactions  . Sulfa Antibiotics   . Lisinopril Rash    Hyponatremia     VITALS:  Blood pressure (!) 103/44, pulse 70, temperature 98.3 F (36.8 C), temperature source Oral, resp. rate 19, height 5\' 4"  (1.626 m), weight 47.1 kg (103 lb 12.8 oz), SpO2 96 %.  PHYSICAL EXAMINATION:   Physical Exam  GENERAL:  75 y.o.-year-old patient lying in the bed with no acute distress.  EYES: Pupils equal, round, reactive to light and accommodation. No scleral icterus. Extraocular muscles intact.  HEENT: Head atraumatic, normocephalic. Oropharynx and nasopharynx clear.  NECK:  Supple, no jugular venous distention. No thyroid enlargement, no tenderness.  LUNGS: Normal breath sounds bilaterally, no wheezing, rales,  rhonchi. No use of accessory muscles of respiration.  CARDIOVASCULAR: S1, S2 normal. No murmurs, rubs, or gallops.  ABDOMEN: Soft, nontender, nondistended. Bowel sounds present. No organomegaly or mass.  EXTREMITIES: No cyanosis, clubbing or edema b/l.    NEUROLOGIC: Cranial nerves II through XII are intact. No focal Motor or sensory deficits b/l.   PSYCHIATRIC: The patient is alert and oriented x 3.  SKIN: No obvious rash, lesion, or ulcer.   LABORATORY PANEL:   CBC  Recent Labs Lab 06/19/16 0446  WBC 7.4  HGB 12.8  HCT 35.7  PLT 181   ------------------------------------------------------------------------------------------------------------------ Chemistries   Recent Labs Lab 06/19/16 0446  06/19/16 2052  06/20/16 0921  NA 128*  < > 125*  < > 128*  K 4.0  --  3.4*  --  2.8*  CL 101  --  97*  --  96*  CO2 17*  --  22  --  27  GLUCOSE 240*  --  287*  --  215*  BUN 23*  --  17  --  9  CREATININE 0.86  --  0.79  --  0.60  CALCIUM 7.4*  --  7.3*  --  7.1*  MG  --   --  2.2  --   --   AST 23  --   --   --   --   ALT 14  --   --   --   --   ALKPHOS 25*  --   --   --   --   BILITOT 0.9  --   --   --   --   < > = values in this interval not  displayed. ------------------------------------------------------------------------------------------------------------------  Cardiac Enzymes  Recent Labs Lab 06/19/16 0446  TROPONINI 0.07*   ------------------------------------------------------------------------------------------------------------------  RADIOLOGY:  No results found.   ASSESSMENT AND PLAN:   [name] [name]  is a 75 y.o. female with a known history of Insulin dependent diabetes mellitus, breast cancer status post mastectomy, history of right eye cataract status post surgery, supposed to get and the right eye surgery 06/18/2016 is presented to the ED with a chief complaint of intractable nausea vomiting and weakness  #Acute hyponatremia-from dehydration Provide  hydration with IV fluids Nephrology input appreciated. Due to metabolic acidosis she is on bicarb drip. This has resolved and will change to NS with Kcl.  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 Edison [name] ophthalmologist has seen the patient. Considering outpatient surgical procedure once patient is medically stable Started patient on Cosopt, Vigamox, Alphagan and prednisone eyedrops  # Insulin requiring diabetes mellitus  SSI  #History of right breast cancer status post mastectomy outpatient follow-up with oncology as recommended   DVT prophylaxis with Lovenox subcutaneous adjusted to renal dose  All the records are reviewed and case discussed with Care Management/Social Workerr. Management plans discussed with the patient, family and they are in agreement.  CODE STATUS: FULL CODE  DVT Prophylaxis: SCDs  TOTAL TIME TAKING CARE OF THIS PATIENT: 35 minutes.   POSSIBLE D/C IN 1-2 DAYS, DEPENDING ON CLINICAL CONDITION.  [name] [name] R M.D on 06/20/2016 at 1:05 PM  Between 7am to 6pm - Pager - [phone]  After 6pm go to [email or web address] - password [email or web address] [facility]  Office  [phone]  CC: Primary care physician; [name] Harrier, MD  Note: This dictation was prepared with Dragon dictation along with smaller phrase technology. Any transcriptional errors that result from this process are unintentional.

## 2016-06-20 NOTE — Progress Notes (Signed)
Inpatient Diabetes Program Recommendations  AACE/ADA: New Consensus Statement on Inpatient Glycemic Control (2015)  Target Ranges:  Prepandial:   less than 140 mg/dL      Peak postprandial:   less than 180 mg/dL (1-2 hours)      Critically ill patients:  140 - 180 mg/dL   Results for [name] [name], [name] [name] (MRN [id number]) as of 06/20/2016 07:41  Ref. Range 06/19/2016 07:30 06/19/2016 11:19 06/19/2016 16:54 06/19/2016 20:53 06/20/2016 07:18  Glucose-Capillary Latest Ref Range: 65 - 99 mg/dL 244 (H) 270 (H) 300 (H) 289 (H) 260 (H)   Review of Glycemic Control  Diabetes history: DM1 Outpatient Diabetes medications: Lantus 4 units QHS, Humalog 2 units TID with meals plus 1 unit if CBG 250-300, 2 units if CBG 301-350 mg/dl, or 3 units if CBG > 351 mg/dl Current orders for Inpatient glycemic control: Lantus 4 units QHS, Novolog 0-9 units TID with meals  Inpatient Diabetes Program Recommendations: Insulin - Basal: Please consider increasing Lantus to 5 units QHS. Correction (SSI): Glucose 289 mg/dl at bedtime last night but no insulin given since no Novolog correction ordered. Please consider adding Novolog bedtime correction scale. Insulin - Meal Coverage: Please order Novolog 3 units TID with meals for meal coverage (in addition to Novolog correction scale).  Thanks, Barnie Alderman, RN, MSN, CDE Diabetes Coordinator Inpatient Diabetes Program [phone] (Team Pager from [place] to [place]) [phone] ([place] office) [phone] [facility] office) [phone] [facility] office)

## 2016-06-20 NOTE — Progress Notes (Signed)
[facility]  ROUNDING NOTE   Subjective:   Na 128  Objective:  Vital signs in last 24 hours:  Temp:  [98.4 F (36.9 C)-99.2 F (37.3 C)] 99.1 F (37.3 C) (08/02 0827) Pulse Rate:  [65-71] 65 (08/02 0518) Resp:  [16-18] 16 (08/02 0518) BP: (105-130)/(41-51) 130/47 (08/02 0518) SpO2:  [97 %] 97 % (08/02 0518) Weight:  [47.1 kg (103 lb 12.8 oz)] 47.1 kg (103 lb 12.8 oz) (08/01 1509)  Weight change: -0.544 kg (-1 lb 3.2 oz) Filed Weights   06/18/16 0906 06/18/16 1510 06/19/16 1509  Weight: 47.6 kg (105 lb) 42.6 kg (93 lb 14.4 oz) 47.1 kg (103 lb 12.8 oz)    Intake/Output: I/O last 3 completed shifts: In: 1537.5 [P.O.:240; I.V.:1297.5] Out: 2200 [Urine:2200]   Intake/Output this shift:  No intake/output data recorded.  Physical Exam: General: NAD,   Head: Normocephalic, atraumatic. Moist oral mucosal membranes  Eyes: Anicteric, PERRL  Neck: Supple, trachea midline  Lungs:  Clear to auscultation  Heart: Regular rate and rhythm  Abdomen:  Soft, nontender,   Extremities:  no peripheral edema.  Neurologic: Nonfocal, moving all four extremities  Skin: No lesions       Basic Metabolic Panel:  Recent Labs Lab 06/18/16 0915 06/18/16 2135 06/19/16 0446 06/19/16 1125 06/19/16 1802 06/19/16 2052 06/20/16 0445 06/20/16 0921  NA 120* 123* 128* 127* 124* 125* 130* 128*  K 4.9 4.0 4.0  --   --  3.4*  --  2.8*  CL 89* 98* 101  --   --  97*  --  96*  CO2 15* 12* 17*  --   --  22  --  27  GLUCOSE 370* 288* 240*  --   --  287*  --  215*  BUN 37* 27* 23*  --   --  17  --  9  CREATININE 1.30* 0.98 0.86  --   --  0.79  --  0.60  CALCIUM 8.0* 7.1* 7.4*  --   --  7.3*  --  7.1*  MG  --   --   --   --   --  2.2  --   --     Liver Function Tests:  Recent Labs Lab 06/18/16 0915 06/19/16 0446  AST 31 23  ALT 17 14  ALKPHOS 30* 25*  BILITOT 1.1 0.9  PROT 6.4* 5.4*  ALBUMIN 4.0 3.3*    Recent Labs Lab 06/18/16 0915  LIPASE 15   No results for  input(s): AMMONIA in the last 168 hours.  CBC:  Recent Labs Lab 06/18/16 0915 06/19/16 0446  WBC 12.8* 7.4  HGB 13.7 12.8  HCT 38.7 35.7  MCV 97.8 97.0  PLT 205 181    Cardiac Enzymes:  Recent Labs Lab 06/18/16 0914 06/18/16 1528 06/18/16 2135 06/19/16 0446  TROPONINI 0.07* 0.07* 0.06* 0.07*    BNP: Invalid input(s): POCBNP  CBG:  Recent Labs Lab 06/19/16 0730 06/19/16 1119 06/19/16 1654 06/19/16 2053 06/20/16 0718  GLUCAP 244* 270* 300* 289* 260*    Microbiology: No results found for this or any previous visit.  Coagulation Studies: No results for input(s): LABPROT, INR in the last 72 hours.  Urinalysis:  Recent Labs  06/18/16 0950  COLORURINE STRAW*  LABSPEC 1.016  PHURINE 5.0  GLUCOSEU >500*  HGBUR 2+*  BILIRUBINUR NEGATIVE  KETONESUR 2+*  PROTEINUR NEGATIVE  NITRITE NEGATIVE  LEUKOCYTESUR NEGATIVE      Imaging: Ct Head Wo Contrast  Result Date: 06/18/2016  CLINICAL DATA:  Nausea, vomiting past 5 days. EXAM: CT HEAD WITHOUT CONTRAST TECHNIQUE: Contiguous axial images were obtained from the base of the skull through the vertex without intravenous contrast. COMPARISON:  02/15/2009 FINDINGS: There is atrophy and chronic small vessel disease changes. No acute intracranial abnormality. Specifically, no hemorrhage, hydrocephalus, mass lesion, acute infarction, or significant intracranial injury. No acute calvarial abnormality. Visualized paranasal sinuses and mastoids clear. Orbital soft tissues unremarkable. IMPRESSION: No acute intracranial abnormality. Atrophy, chronic microvascular disease. Electronically Signed   By: Rolm Baptise M.D.   On: 06/18/2016 12:04    Medications:   . 0.9 % NaCl with KCl 40 mEq / L 75 mL/hr (06/20/16 1032)   . aspirin EC  325 mg Oral Daily  . brimonidine  1 drop Right Eye TID  . ciprofloxacin  250 mg Oral BID  . dorzolamide-timolol  1 drop Right Eye BID  . enoxaparin (LOVENOX) injection  40 mg Subcutaneous Q24H   . feeding supplement (GLUCERNA SHAKE)  237 mL Oral BID AC & HS  . insulin aspart  0-9 Units Subcutaneous TID WC  . insulin glargine  4 Units Subcutaneous QHS  . moxifloxacin  1 drop Right Eye QID  . potassium chloride  40 mEq Oral Q4H  . prednisoLONE acetate  1 drop Right Eye QID  . sodium chloride flush  3 mL Intravenous Q12H   ondansetron (ZOFRAN) IV  Assessment/ Plan:  [name] is a 75 y.o. white female with hypothyroidism, hypertension, hyperlipidemia, diabetes mellitus type II, history of breast cancer and hysterectomy , who was admitted to [facility] on 06/18/2016  1. Hyponatremia: acute on chronic 2. Metabolic Acidosis 3. Chronic Kidney Disease stage III: age related. GFR of 54.  4. Diabetes Mellitus type II with chronic kidney disease: insulin dependent. With glucosuria.  5. Hypertension  Plan Nausea and vomiting leading to the loss of hydrochloric acid giving a hyponatremia and metabolic acidosis picture.  Metabolic acidosis improved but caused hypokalemia Creatinine improved.  - Now on sodium bicarbonate with KCl 72mEq at 60mL/hr - Serial sodium check.    LOS: 2 Zadia Uhde 8/2/201710:50 AM

## 2016-06-20 NOTE — Progress Notes (Signed)
[name] [name] is an 76 y.o. female.   Subjective: No eye pain.  Overall she is feeling better.  Objective: Vital signs in last 24 hours: Temp:  [98.3 F (36.8 C)-99.2 F (37.3 C)] 98.3 F (36.8 C) (08/02 1158) Pulse Rate:  [65-95] 70 (08/02 1158) Resp:  [16-19] 19 (08/02 1158) BP: (103-130)/(41-47) 103/44 (08/02 1158) SpO2:  [96 %-97 %] 96 % (08/02 1158)    Physical Exam:  Mental status: Alert and Oriented x 4  Visual Acuity:  20/200 OD near card [place]  Pupils:  Peaked superiorly OD.  Miotic, mildly eccentric.  Motility:  Full/ orthophoric  IOP:  16 mm OD, 14 mm OS.  External/ Lids/ Lashes:  Blepharitis, lid mattering OD.  Anterior Segment:  Conjunctiva:  Normal  OU  Cornea:  Clear, interrupted sutures superiorly.  OD.  Anterior Chamber: ACIOL, deep and grossly quiet with portable slit lamp OD.   Lens:   Anterior chamber intraocular lens.    Medications: . aspirin EC  325 mg Oral Daily  . brimonidine  1 drop Right Eye TID  . ciprofloxacin  250 mg Oral BID  . dorzolamide-timolol  1 drop Right Eye BID  . enoxaparin (LOVENOX) injection  40 mg Subcutaneous Q24H  . feeding supplement (GLUCERNA SHAKE)  237 mL Oral BID AC & HS  . insulin aspart  0-9 Units Subcutaneous TID WC  . insulin aspart  3 Units Subcutaneous TID WC  . insulin glargine  4 Units Subcutaneous QHS  . moxifloxacin  1 drop Right Eye QID  . prednisoLONE acetate  1 drop Right Eye QID  . sodium chloride flush  3 mL Intravenous Q12H     Recent Labs  06/18/16 0915  06/19/16 0446  06/19/16 2052 06/20/16 0445 06/20/16 0921  WBC 12.8*  --  7.4  --   --   --   --   HGB 13.7  --  12.8  --   --   --   --   HCT 38.7  --  35.7  --   --   --   --   NA 120*  < > 128*  < > 125* 130* 128*  K 4.9  < > 4.0  --  3.4*  --  2.8*  CL 89*  < > 101  --  97*  --  96*  CO2 15*  < > 17*  --  22  --  27  BUN 37*  < > 23*  --  17  --  9  CREATININE 1.30*  < > 0.86  --  0.79  --  0.60  < > = values in this interval not  displayed.  Studies/Results: No results found.   Assessment/Plan:   LOS: 2 days   Assessment/Plan: Overall continues to improve.  Intraocular pressure has normalized on topical therapy.   1. Multiple medical issues: treated by hospitalists, appreciate their management of this complex medical patient.  2. Retained lens fragments in the right eye following cataract surgery.  Increased intraocular pressure is well controlled on topical therapy. After patient is stabilized from medical issues and discharged, patient will need to have outpatient ocular surgery by a retina specialist at [facility]. She is scheduled for outpatient surgery with Dr. Jacklynn Ganong Monday 8/7.  Continue the following medications until the time of her surgery:  (topical glaucoma drops, antibiotic and steroid)  -- prednisilone acetate 1% four times per day in the right eye. -- moxifloxacin four times per day right eye. --  brimonidine 3 times per day right eye. -- dorzolamide/timolol 2 times per day right eye.  I will continue to follow the patient. Benay Pillow 8/2/20176:01 PM

## 2016-06-21 LAB — BASIC METABOLIC PANEL
ANION GAP: 3 — AB (ref 5–15)
BUN: 9 mg/dL (ref 6–20)
CALCIUM: 7.4 mg/dL — AB (ref 8.9–10.3)
CO2: 24 mmol/L (ref 22–32)
Chloride: 103 mmol/L (ref 101–111)
Creatinine, Ser: 0.62 mg/dL (ref 0.44–1.00)
GLUCOSE: 258 mg/dL — AB (ref 65–99)
POTASSIUM: 4.7 mmol/L (ref 3.5–5.1)
SODIUM: 130 mmol/L — AB (ref 135–145)

## 2016-06-21 LAB — GLUCOSE, CAPILLARY
GLUCOSE-CAPILLARY: 275 mg/dL — AB (ref 65–99)
Glucose-Capillary: 278 mg/dL — ABNORMAL HIGH (ref 65–99)

## 2016-06-21 MED ORDER — PREDNISOLONE ACETATE 1 % OP SUSP: 1.0000 [drp] | Freq: Four times a day (QID) | OPHTHALMIC | 1 refills | Status: DC

## 2016-06-21 MED ORDER — DORZOLAMIDE HCL-TIMOLOL MAL 2-0.5 % OP SOLN: 1.0000 [drp] | Freq: Two times a day (BID) | OPHTHALMIC | 1 refills | Status: DC

## 2016-06-21 MED ORDER — INSULIN GLARGINE 100 UNIT/ML ~~LOC~~ SOLN
5.0000 [IU] | Freq: Every day | SUBCUTANEOUS | Status: DC
  Filled 2016-06-21: qty 0.05

## 2016-06-21 MED ORDER — INSULIN GLARGINE 100 UNIT/ML ~~LOC~~ SOLN: 5.0000 [IU] | Freq: Every day | SUBCUTANEOUS | 11 refills | Status: DC

## 2016-06-21 MED ORDER — MOXIFLOXACIN HCL 0.5 % OP SOLN: 1.0000 [drp] | Freq: Four times a day (QID) | OPHTHALMIC | 1 refills | Status: DC

## 2016-06-21 MED ORDER — GLUCERNA SHAKE PO LIQD: 237.0000 mL | Freq: Two times a day (BID) | ORAL | 0 refills | Status: DC

## 2016-06-21 MED ORDER — INSULIN LISPRO 100 UNIT/ML ~~LOC~~ SOLN: 3.0000 [IU] | Freq: Three times a day (TID) | SUBCUTANEOUS | 5 refills | Status: DC

## 2016-06-21 MED ORDER — BRIMONIDINE TARTRATE 0.2 % OP SOLN: 1.0000 [drp] | Freq: Three times a day (TID) | OPHTHALMIC | 1 refills | Status: DC

## 2016-06-21 NOTE — Progress Notes (Signed)
Discharge instructions along with home medication list and follow up gone over with patient and husband, both verbalized that they understood instructions. Printed rx given to patients along with eye drops ordered by md. Waiting on walker to be delivered. Will remove iv and telemetry once ready to be discharged.

## 2016-06-21 NOTE — Progress Notes (Signed)
[facility]  ROUNDING NOTE   Subjective:   Na 130  Objective:  Vital signs in last 24 hours:  Temp:  [98.3 F (36.8 C)-98.9 F (37.2 C)] 98.3 F (36.8 C) (08/03 0445) Pulse Rate:  [62-95] 62 (08/03 0445) Resp:  [16-20] 20 (08/03 0445) BP: (96-131)/(36-54) 125/39 (08/03 0445) SpO2:  [96 %-99 %] 98 % (08/03 0445)  Weight change:  Filed Weights   06/18/16 0906 06/18/16 1510 06/19/16 1509  Weight: 47.6 kg (105 lb) 42.6 kg (93 lb 14.4 oz) 47.1 kg (103 lb 12.8 oz)    Intake/Output: I/O last 3 completed shifts: In: 107 [P.O.:720] Out: 1850 [Urine:1850]   Intake/Output this shift:  Total I/O In: 240 [P.O.:240] Out: -   Physical Exam: General: NAD,   Head: Normocephalic, atraumatic. Moist oral mucosal membranes  Eyes: Anicteric, PERRL  Neck: Supple, trachea midline  Lungs:  Clear to auscultation  Heart: Regular rate and rhythm  Abdomen:  Soft, nontender,   Extremities:  no peripheral edema.  Neurologic: Nonfocal, moving all four extremities  Skin: No lesions       Basic Metabolic Panel:  Recent Labs Lab 06/18/16 2135 06/19/16 0446  06/19/16 1802 06/19/16 2052 06/20/16 0445 06/20/16 0921 06/21/16 0456  NA 123* 128*  < > 124* 125* 130* 128* 130*  K 4.0 4.0  --   --  3.4*  --  2.8* 4.7  CL 98* 101  --   --  97*  --  96* 103  CO2 12* 17*  --   --  22  --  27 24  GLUCOSE 288* 240*  --   --  287*  --  215* 258*  BUN 27* 23*  --   --  17  --  9 9  CREATININE 0.98 0.86  --   --  0.79  --  0.60 0.62  CALCIUM 7.1* 7.4*  --   --  7.3*  --  7.1* 7.4*  MG  --   --   --   --  2.2  --   --   --   < > = values in this interval not displayed.  Liver Function Tests:  Recent Labs Lab 06/18/16 0915 06/19/16 0446  AST 31 23  ALT 17 14  ALKPHOS 30* 25*  BILITOT 1.1 0.9  PROT 6.4* 5.4*  ALBUMIN 4.0 3.3*    Recent Labs Lab 06/18/16 0915  LIPASE 15   No results for input(s): AMMONIA in the last 168 hours.  CBC:  Recent Labs Lab 06/18/16 0915  06/19/16 0446  WBC 12.8* 7.4  HGB 13.7 12.8  HCT 38.7 35.7  MCV 97.8 97.0  PLT 205 181    Cardiac Enzymes:  Recent Labs Lab 06/18/16 0914 06/18/16 1528 06/18/16 2135 06/19/16 0446  TROPONINI 0.07* 0.07* 0.06* 0.07*    BNP: Invalid input(s): POCBNP  CBG:  Recent Labs Lab 06/20/16 0718 06/20/16 1155 06/20/16 1624 06/20/16 2025 06/21/16 0719  GLUCAP 260* 211* 242* 206* 278*    Microbiology: No results found for this or any previous visit.  Coagulation Studies: No results for input(s): LABPROT, INR in the last 72 hours.  Urinalysis: No results for input(s): COLORURINE, LABSPEC, PHURINE, GLUCOSEU, HGBUR, BILIRUBINUR, KETONESUR, PROTEINUR, UROBILINOGEN, NITRITE, LEUKOCYTESUR in the last 72 hours.  Invalid input(s): APPERANCEUR    Imaging: No results found.   Medications:     . aspirin EC  325 mg Oral Daily  . brimonidine  1 drop Right Eye TID  . dorzolamide-timolol  1 drop Right Eye BID  . enoxaparin (LOVENOX) injection  40 mg Subcutaneous Q24H  . feeding supplement (GLUCERNA SHAKE)  237 mL Oral BID AC & HS  . insulin aspart  0-9 Units Subcutaneous TID WC  . insulin aspart  3 Units Subcutaneous TID WC  . insulin glargine  5 Units Subcutaneous QHS  . moxifloxacin  1 drop Right Eye QID  . prednisoLONE acetate  1 drop Right Eye QID  . sodium chloride flush  3 mL Intravenous Q12H   ondansetron (ZOFRAN) IV  Assessment/ Plan:  [name] is a 75 y.o. white female with hypothyroidism, hypertension, hyperlipidemia, diabetes mellitus type II, history of breast cancer and hysterectomy , who was admitted to [facility] on 06/18/2016  1. Hyponatremia: acute on chronic 2. Metabolic Acidosis 3. Chronic Kidney Disease stage III: age related. GFR of 54.  4. Diabetes Mellitus type II with chronic kidney disease: insulin dependent. With glucosuria.  5. Hypertension  Plan Electrolytes improved.  Needs outpatient follow up.    LOS: [name] [name],  [name] [name] 8/3/201710:28 AM

## 2016-06-21 NOTE — Progress Notes (Signed)
Inpatient Diabetes Program Recommendations  AACE/ADA: New Consensus Statement on Inpatient Glycemic Control (2015)  Target Ranges:  Prepandial:   less than 140 mg/dL      Peak postprandial:   less than 180 mg/dL (1-2 hours)      Critically ill patients:  140 - 180 mg/dL   Lab Results  Component Value Date   GLUCAP 278 (H) 06/21/2016   HGBA1C 7.5 (H) 06/19/2016    Review of Glycemic Control  Results for ARNIKA, BYERLEY (MRN [id number]) as of 06/21/2016 07:54  Ref. Range 06/20/2016 07:18 06/20/2016 11:55 06/20/2016 16:24 06/20/2016 20:25 06/21/2016 07:19  Glucose-Capillary Latest Ref Range: 65 - 99 mg/dL 260 (H) 211 (H) 242 (H) 206 (H) 278 (H)    Diabetes history:DM1 Outpatient Diabetes medications: Lantus 4 units QHS, Humalog 2 units TID with meals plus 1 unit if CBG 250-300, 2 units if CBG 301-350 mg/dl, or 3 units if CBG > 351 mg/dl  Current orders for Inpatient glycemic control: Lantus 4 units QHS, Novolog 0-9 units TID with meals, Novolog 3 units tid with meals  Inpatient Diabetes Program Recommendations:  Fasting blood sugar remains elevated- Please consider increasing Lantus to 5 units QHS.  Bedtime blood sugar 206mg /dl but no hs insulin ordered- Please consider adding Novolog bedtime correction scale 0-5 units.  Gentry Fitz, RN, BA, MHA, CDE Diabetes Coordinator Inpatient Diabetes Program  [phone] (Team Pager) [phone] ([place]) 06/21/2016 7:56 AM

## 2016-06-21 NOTE — Discharge Summary (Signed)
[facility] NAME: [name] [name]    MR#:  [id number]  DATE OF BIRTH:  January 01, 1941  DATE OF ADMISSION:  06/18/2016 ADMITTING PHYSICIAN: Nicholes Mango, MD  DATE OF DISCHARGE: 06/21/2016  PRIMARY CARE PHYSICIAN: Tracie Harrier, MD    ADMISSION DIAGNOSIS:  Dehydration [E86.0] Hyponatremia [E87.1] Intraocular pressure increase, right [H40.051]  DISCHARGE DIAGNOSIS:  Active Problems:   Hyponatremia Dehydration Open angle glaucoma   SECONDARY DIAGNOSIS:   Past Medical History:  Diagnosis Date  . Breast cancer ([place]) 2011   RT MASTECTOMY  . Cancer ([place]) 2011   BREAST CA  . diabetes insulin dep   . High cholesterol   . Hypertension   . Hypothyroidism   . Osteoporosis   . Thyroid disease     HOSPITAL COURSE:   [name] [name] a 75 y.o. femalewith a known history of Insulin dependent diabetes mellitus, breast cancer status post mastectomy, history of right eye cataract status post surgery,supposed to get and the right eye surgery 06/18/2016 is presented to the ED with a chief complaint of intractable nausea vomiting and weakness  #Acute on chronic hyponatremia-from dehydration Provided hydration with IV fluids. Clinically improved. Sodium is at 130, almost at her baseline Nephrology input appreciated. Outpatient follow-up is recommended Due to metabolic acidosis patient was provided with bicarb drip.    # Acute gastritis with intractable nausea vomiting and dehydration - Improved Tolerating food Provided hydration with IV fluids Given antiemetics and PPI  #Elevated troponin - stable on repeat check Patient denies any chest pain Acute MI ruled out  # Insulin requiring diabetes mellitus  SSI. Lantus dose increased to 5 units daily at bedtime  #History of right breast cancer status post mastectomy outpatient follow-up with oncology as recommended  #Right eye open angle glaucoma-elevated right intraocular  pressure Dr. Edison Pace ophthalmologist has seen the patient.Considering outpatient surgical procedure once patient is medically stable  # Retained lens fragments in the right eye following cataract surgery. Increased intraocular pressure is well controlled on topical therapy. After patient is stabilized from medical issues and discharged, patient will need to have outpatient ocular surgery by a retina specialist at [facility]. She is scheduled for outpatient surgery with Dr. Jacklynn Ganong Monday 8/7. - recommended to continue the following medications until the time of her surgery: (topical glaucoma drops, antibiotic and steroid)  -- prednisilone acetate 1% four times per day in the right eye. -- moxifloxacin four times per day right eye. -- brimonidine 3 times per day right eye. -- dorzolamide/timolol 2 times per day right eye.  DISCHARGE CONDITIONS:   fair  CONSULTS OBTAINED:  Treatment Team:  Lavonia Dana, MD Eulogio Bear, MD   PROCEDURES  none  DRUG ALLERGIES:   Allergies  Allergen Reactions  . Sulfa Antibiotics   . Lisinopril Rash    Hyponatremia     DISCHARGE MEDICATIONS:   Current Discharge Medication List    START taking these medications   Details  brimonidine (ALPHAGAN) 0.2 % ophthalmic solution Place 1 drop into the right eye 3 (three) times daily. Qty: 5 mL, Refills: 1    dorzolamide-timolol (COSOPT) 22.3-6.8 MG/ML ophthalmic solution Place 1 drop into the right eye 2 (two) times daily. Qty: 10 mL, Refills: 1    feeding supplement, GLUCERNA SHAKE, (GLUCERNA SHAKE) LIQD Take 237 mLs by mouth 2 (two) times daily at 8 am and 10 pm. Qty: 60 Can, Refills: 0    moxifloxacin (VIGAMOX) 0.5 % ophthalmic solution Place 1 drop  into the right eye 4 (four) times daily. Qty: 3 mL, Refills: 1    prednisoLONE acetate (PRED FORTE) 1 % ophthalmic suspension Place 1 drop into the right eye 4 (four) times daily. Qty: 5 mL, Refills: 1      CONTINUE these medications which  have CHANGED   Details  insulin glargine (LANTUS) 100 UNIT/ML injection Inject 0.05 mLs (5 Units total) into the skin at bedtime. Qty: 10 mL, Refills: 11    insulin lispro (HUMALOG) 100 UNIT/ML injection Inject 0.03 mLs (3 Units total) into the skin 3 (three) times daily with meals. Qty: 10 mL, Refills: 5      CONTINUE these medications which have NOT CHANGED   Details  amLODipine (NORVASC) 5 MG tablet Take 1 tablet by mouth daily.    calcium-vitamin D (OSCAL-500) 500-400 MG-UNIT per tablet Take 1 tablet by mouth daily.     denosumab (PROLIA) 60 MG/ML SOLN injection Inject 60 mg into the skin every 6 (six) months.   Associated Diagnoses: Malignant neoplasm of right female breast, unspecified site of breast ([place]); Osteoporosis; Hypocalcemia    DUREZOL 0.05 % EMUL     enalapril (VASOTEC) 20 MG tablet Take 20 mg by mouth daily.    lovastatin (MEVACOR) 20 MG tablet Take 20 mg by mouth at bedtime.    tamoxifen (NOLVADEX) 20 MG tablet Take 20 mg by mouth daily.      STOP taking these medications     ciprofloxacin (CIPRO) 500 MG tablet          DISCHARGE INSTRUCTIONS:   Activity as tolerated per PT recommendations. Ambulate with walker Diabetic diet Follow-up with primary care physician in a week Follow-up with nephrology Dr. Juleen China in a  week Outpatient follow-up with [facility] ophthalmologist Dr. Tedra Coupe on Monday 8/7   DIET:  Diabetic diet  DISCHARGE CONDITION:  Fair  ACTIVITY:  Activity as tolerated pe rPT  OXYGEN:  Home Oxygen: No.   Oxygen Delivery: room air  DISCHARGE LOCATION:  home   If you experience worsening of your admission symptoms, develop shortness of breath, life threatening emergency, suicidal or homicidal thoughts you must seek medical attention immediately by calling 911 or calling your MD immediately  if symptoms less severe.  You Must read complete instructions/literature along with all the possible adverse reactions/side effects for all the  Medicines you take and that have been prescribed to you. Take any new Medicines after you have completely understood and accpet all the possible adverse reactions/side effects.   Please note  You were cared for by a hospitalist during your hospital stay. If you have any questions about your discharge medications or the care you received while you were in the hospital after you are discharged, you can call the unit and asked to speak with the hospitalist on call if the hospitalist that took care of you is not available. Once you are discharged, your primary care physician will handle any further medical issues. Please note that NO REFILLS for any discharge medications will be authorized once you are discharged, as it is imperative that you return to your primary care physician (or establish a relationship with a primary care physician if you do not have one) for your aftercare needs so that they can reassess your need for medications and monitor your lab values.     Today  Chief Complaint  Patient presents with  . Emesis   Pt is feeling better .Tolerating diet. Wants to go home  ROS:  CONSTITUTIONAL: Denies  fevers, chills. Denies any fatigue, weakness.  EYES: Denies blurry vision, double vision, eye pain. EARS, NOSE, THROAT: Denies tinnitus, ear pain, hearing loss. RESPIRATORY: Denies cough, wheeze, shortness of breath.  CARDIOVASCULAR: Denies chest pain, palpitations, edema.  GASTROINTESTINAL: Denies nausea, vomiting, diarrhea, abdominal pain. Denies bright red blood per rectum. GENITOURINARY: Denies dysuria, hematuria. ENDOCRINE: Denies nocturia or thyroid problems. HEMATOLOGIC AND LYMPHATIC: Denies easy bruising or bleeding. SKIN: Denies rash or lesion. MUSCULOSKELETAL: Denies pain in neck, back, shoulder, knees, hips or arthritic symptoms.  NEUROLOGIC: Denies paralysis, paresthesias.  PSYCHIATRIC: Denies anxiety or depressive symptoms.   VITAL SIGNS:  Blood pressure (!) 101/44,  pulse 70, temperature 98.1 F (36.7 C), resp. rate 17, height 5\' 4"  (1.626 m), weight 47.1 kg (103 lb 12.8 oz), SpO2 98 %.  I/O:    Intake/Output Summary (Last 24 hours) at 06/21/16 1213 Last data filed at 06/21/16 0900  Gross per 24 hour  Intake              720 ml  Output              750 ml  Net              -30 ml    PHYSICAL EXAMINATION:  GENERAL:  75 y.o.-year-old patient lying in the bed with no acute distress.  EYES: Pupils equal, round, reactive to light and accommodation. No scleral icterus. Extraocular muscles intact.  HEENT: Head atraumatic, normocephalic. Oropharynx and nasopharynx clear.  NECK:  Supple, no jugular venous distention. No thyroid enlargement, no tenderness.  LUNGS: Normal breath sounds bilaterally, no wheezing, rales,rhonchi or crepitation. No use of accessory muscles of respiration.  CARDIOVASCULAR: S1, S2 normal. No murmurs, rubs, or gallops.  ABDOMEN: Soft, non-tender, non-distended. Bowel sounds present. No organomegaly or mass.  EXTREMITIES: No pedal edema, cyanosis, or clubbing.  NEUROLOGIC: Cranial nerves II through XII are intact. Muscle strength 5/5 in all extremities. Sensation intact. Gait not checked.  PSYCHIATRIC: The patient is alert and oriented x 3.  SKIN: No obvious rash, lesion, or ulcer.   DATA REVIEW:   CBC  Recent Labs Lab 06/19/16 0446  WBC 7.4  HGB 12.8  HCT 35.7  PLT 181    Chemistries   Recent Labs Lab 06/19/16 0446  06/19/16 2052  06/21/16 0456  NA 128*  < > 125*  < > 130*  K 4.0  --  3.4*  < > 4.7  CL 101  --  97*  < > 103  CO2 17*  --  22  < > 24  GLUCOSE 240*  --  287*  < > 258*  BUN 23*  --  17  < > 9  CREATININE 0.86  --  0.79  < > 0.62  CALCIUM 7.4*  --  7.3*  < > 7.4*  MG  --   --  2.2  --   --   AST 23  --   --   --   --   ALT 14  --   --   --   --   ALKPHOS 25*  --   --   --   --   BILITOT 0.9  --   --   --   --   < > = values in this interval not displayed.  Cardiac Enzymes  Recent  Labs Lab 06/19/16 0446  TROPONINI 0.07*    Microbiology Results  No results found for this or any previous visit.  RADIOLOGY:  Ct Head  Wo Contrast  Result Date: 06/18/2016 CLINICAL DATA:  Nausea, vomiting past 5 days. EXAM: CT HEAD WITHOUT CONTRAST TECHNIQUE: Contiguous axial images were obtained from the base of the skull through the vertex without intravenous contrast. COMPARISON:  02/15/2009 FINDINGS: There is atrophy and chronic small vessel disease changes. No acute intracranial abnormality. Specifically, no hemorrhage, hydrocephalus, mass lesion, acute infarction, or significant intracranial injury. No acute calvarial abnormality. Visualized paranasal sinuses and mastoids clear. Orbital soft tissues unremarkable. IMPRESSION: No acute intracranial abnormality. Atrophy, chronic microvascular disease. Electronically Signed   By: Rolm Baptise M.D.   On: 06/18/2016 12:04   EKG:   Orders placed or performed during the hospital encounter of 06/18/16  . ED EKG  . ED EKG  . EKG 12-Lead  . EKG 12-Lead      Management plans discussed with the patient, family and they are in agreement.  CODE STATUS:     Code Status Orders        Start     Ordered   06/18/16 1450  Full code  Continuous     06/18/16 1449    Code Status History    Date Active Date Inactive Code Status Order ID Comments User Context   This patient has a current code status but no historical code status.    Advance Directive Documentation   Flowsheet Row Most Recent Value  Type of Advance Directive  Healthcare Power of Attorney  Pre-existing out of facility DNR order (yellow form or pink MOST form)  No data  "MOST" Form in Place?  No data      TOTAL TIME TAKING CARE OF THIS PATIENT: 45  minutes.   Note: This dictation was prepared with Dragon dictation along with smaller phrase technology. Any transcriptional errors that result from this process are unintentional.   @M[facility] @  on 06/21/2016 at 12:13  PM  Between 7am to 6pm - Pager - [phone]  After 6pm go to [email or web address] - password [email or web address] [facility]  Office  [phone]  CC: Primary care physician; Tracie Harrier, MD

## 2016-06-21 NOTE — Discharge Instructions (Signed)
Activity as tolerated per PT recommendations. Ambulate with walker Diabetic diet Follow-up with primary care physician in a week Follow-up with nephrology Dr. Juleen China in a  week Outpatient follow-up with [facility] ophthalmologist Dr. Tedra Coupe on Monday 8/7

## 2016-06-21 NOTE — Progress Notes (Signed)
Walker delivered. Patient will be discharged home on ra. No distress noted husband at bedside

## 2016-07-06 ENCOUNTER — Encounter: Payer: Self-pay | Admitting: Sports Medicine

## 2016-07-06 ENCOUNTER — Ambulatory Visit (INDEPENDENT_AMBULATORY_CARE_PROVIDER_SITE_OTHER): Payer: Medicare Other | Admitting: Sports Medicine

## 2016-07-06 DIAGNOSIS — B351 Tinea unguium: Secondary | ICD-10-CM

## 2016-07-06 DIAGNOSIS — M79676 Pain in unspecified toe(s): Secondary | ICD-10-CM | POA: Diagnosis not present

## 2016-07-06 DIAGNOSIS — Q828 Other specified congenital malformations of skin: Secondary | ICD-10-CM

## 2016-07-06 DIAGNOSIS — E119 Type 2 diabetes mellitus without complications: Secondary | ICD-10-CM

## 2016-07-06 DIAGNOSIS — M79606 Pain in leg, unspecified: Secondary | ICD-10-CM

## 2016-07-06 NOTE — Progress Notes (Signed)
Patient ID: [name] [name], female   DOB: February 20, 1941, 75 y.o.   MRN: [id number]  Subjective: [name] [name] is a 75 y.o. female patient with history of type 2 diabetes who presents to office today complaining of painful callus skin and long, painful nails  while ambulating in shoes; unable to trim. Patient states that the glucose reading this morning was 200 mg/dl. Was hospitalized for 500 blood sugar.  Patient denies any new changes in medication or new problems. Patient denies any new cramping, numbness, burning or tingling in the legs.  Patient Active Problem List   Diagnosis Date Noted  . Hyponatremia 06/18/2016  . Osteoporosis 04/23/2016  . Malignant neoplasm of breast ([place]) 03/30/2016  . HLD (hyperlipidemia) 03/30/2016  . BP (high blood pressure) 03/30/2016  . Adult hypothyroidism 03/30/2016  . Type 1 diabetes mellitus ([place]) 02/10/2016  . Type 1 diabetes mellitus without complication ([place]) [id number]  . Dermatophytosis of nail 08/24/2013  . Pain in lower limb 08/24/2013   Current Outpatient Prescriptions on File Prior to Visit  Medication Sig Dispense Refill  . amLODipine (NORVASC) 5 MG tablet Take 1 tablet by mouth daily.    . brimonidine (ALPHAGAN) 0.2 % ophthalmic solution Place 1 drop into the right eye 3 (three) times daily. 5 mL 1  . calcium-vitamin D (OSCAL-500) 500-400 MG-UNIT per tablet Take 1 tablet by mouth daily.     Marland Kitchen denosumab (PROLIA) 60 MG/ML SOLN injection Inject 60 mg into the skin every 6 (six) months.    . dorzolamide-timolol (COSOPT) 22.3-6.8 MG/ML ophthalmic solution Place 1 drop into the right eye 2 (two) times daily. 10 mL 1  . DUREZOL 0.05 % EMUL     . enalapril (VASOTEC) 20 MG tablet Take 20 mg by mouth daily.    . feeding supplement, GLUCERNA SHAKE, (GLUCERNA SHAKE) LIQD Take 237 mLs by mouth 2 (two) times daily at 8 am and 10 pm. 60 Can 0  . insulin glargine (LANTUS) 100 UNIT/ML injection Inject 0.05 mLs (5 Units total) into the skin at bedtime. 10 mL 11  .  insulin lispro (HUMALOG) 100 UNIT/ML injection Inject 0.03 mLs (3 Units total) into the skin 3 (three) times daily with meals. 10 mL 5  . lovastatin (MEVACOR) 20 MG tablet Take 20 mg by mouth at bedtime.    . moxifloxacin (VIGAMOX) 0.5 % ophthalmic solution Place 1 drop into the right eye 4 (four) times daily. 3 mL 1  . prednisoLONE acetate (PRED FORTE) 1 % ophthalmic suspension Place 1 drop into the right eye 4 (four) times daily. 5 mL 1  . tamoxifen (NOLVADEX) 20 MG tablet Take 20 mg by mouth daily.     No current facility-administered medications on file prior to visit.    Allergies  Allergen Reactions  . Sulfa Antibiotics Other (See Comments)    possible  . Sulfasalazine   . Lisinopril Rash    Hyponatremia  Hyponatremia    Lab: No recent Hemoglobin A1C on fiile  Objective: General: Patient is awake, alert, and oriented x 3 and in no acute distress.  Integument: Skin is warm, dry and supple bilateral. Nails are tender, long, thickened and  dystrophic with subungual debris, consistent with onychomycosis, 1-5 bilateral. Keratotic lesions with a central nucleated core noted sub 4 and plantar heel on left and sub 2 and sub 5 on right with no signs of infection. Remaining integument unremarkable.  Vasculature:  Dorsalis Pedis pulse 1/4 bilateral. Posterior Tibial pulse  1/4 bilateral.  Capillary fill  time <3 sec 1-5 bilateral. Scant hair growth to the level of the digits. Temperature gradient within normal limits. Mild varicosities present bilateral. No edema present bilateral.   Neurology: The patient has intact sensation measured with a 5.07/10g Semmes Weinstein Monofilament at all pedal sites bilateral . Vibratory sensation diminished bilateral with tuning fork. No Babinski sign present bilateral.   Musculoskeletal: Fat pad atrophy and Mild lesser hammertoes and bunion pedal deformities noted bilateral. Muscular strength 5/5 in all lower extremity muscular groups bilateral without  pain or limitation on range of motion . No tenderness with calf compression bilateral.  Assessment and Plan: Problem List Items Addressed This Visit      Musculoskeletal and Integument   Dermatophytosis of nail - Primary    Other Visit Diagnoses    Porokeratosis       Pain of toe, unspecified laterality       Diabetes mellitus without complication (HCC)       Pain of lower extremity, unspecified laterality         -Examined patient. -Discussed and educated patient on diabetic foot care, especially with  regards to the vascular, neurological and musculoskeletal systems.  -Stressed the importance of good glycemic control and the detriment of not  controlling glucose levels in relation to the foot. -Cont with toe spacer to protect 2nd to on left from irritation -Mechanically debrided all nails 1-5 bilateral using sterile nail nipper and filed with dremel without incident  -Debrided porokeratotic lesions x 4 using sterile chisel blade without incident. Recommend daily skin emollients -Recommend good supportive shoes daily. -Answered all patient questions -Patient to return in 3 months for at risk foot care -Patient advised to call the office if any problems or questions arise in the meantime.  Landis Martins, DPM

## 2016-09-28 ENCOUNTER — Ambulatory Visit (INDEPENDENT_AMBULATORY_CARE_PROVIDER_SITE_OTHER): Payer: Medicare Other | Admitting: Podiatry

## 2016-09-28 DIAGNOSIS — E0843 Diabetes mellitus due to underlying condition with diabetic autonomic (poly)neuropathy: Secondary | ICD-10-CM

## 2016-09-28 DIAGNOSIS — Q828 Other specified congenital malformations of skin: Secondary | ICD-10-CM

## 2016-09-28 DIAGNOSIS — M79676 Pain in unspecified toe(s): Secondary | ICD-10-CM

## 2016-09-28 DIAGNOSIS — B351 Tinea unguium: Secondary | ICD-10-CM | POA: Diagnosis not present

## 2016-09-28 DIAGNOSIS — L851 Acquired keratosis [keratoderma] palmaris et plantaris: Secondary | ICD-10-CM

## 2016-09-28 DIAGNOSIS — L603 Nail dystrophy: Secondary | ICD-10-CM

## 2016-09-28 DIAGNOSIS — M79672 Pain in left foot: Secondary | ICD-10-CM

## 2016-09-28 DIAGNOSIS — M79671 Pain in right foot: Secondary | ICD-10-CM

## 2016-09-28 DIAGNOSIS — L608 Other nail disorders: Secondary | ICD-10-CM

## 2016-09-28 DIAGNOSIS — L84 Corns and callosities: Secondary | ICD-10-CM

## 2016-09-28 NOTE — Progress Notes (Signed)
SUBJECTIVE Patient with a history of diabetes mellitus presents to office today complaining of elongated, thickened nails. Pain while ambulating in shoes. Patient is unable to trim their own nails.   Allergies  Allergen Reactions  . Sulfa Antibiotics Other (See Comments)    possible  . Sulfasalazine   . Lisinopril Rash    Hyponatremia  Hyponatremia     OBJECTIVE General Patient is awake, alert, and oriented x 3 and in no acute distress. Derm Hyperkeratotic porokeratotic skin lesions noted to the bilateral feet and heels. Skin is dry and supple bilateral. Negative open lesions or macerations. Remaining integument unremarkable. Nails are tender, long, thickened and dystrophic with subungual debris, consistent with onychomycosis, 1-5 bilateral. No signs of infection noted. Vasc  DP and PT pedal pulses palpable bilaterally. Temperature gradient within normal limits.  Neuro Epicritic and protective threshold sensation diminished bilaterally.  Musculoskeletal Exam No symptomatic pedal deformities noted bilateral. Muscular strength within normal limits.  ASSESSMENT 1. Diabetes Mellitus w/ peripheral neuropathy 2. Onychomycosis of nail due to dermatophyte bilateral 3. Pain in foot bilateral  PLAN OF CARE 1. Patient evaluated today. 2. Instructed to maintain good pedal hygiene and foot care. Stressed importance of controlling blood sugar.  3. Mechanical debridement of nails 1-5 bilaterally performed using a nail nipper. Filed with dremel without incident.  4. Excisional debridement of the porokeratotic lesions was performed using a chisel blade without incident 5  5. Return to clinic in 3 mos.    Edrick Kins, DPM

## 2016-10-15 ENCOUNTER — Other Ambulatory Visit: Payer: Self-pay | Admitting: *Deleted

## 2016-10-15 MED ORDER — TAMOXIFEN CITRATE 20 MG PO TABS: 20.0000 mg | ORAL_TABLET | Freq: Every day | ORAL | 1 refills | Status: DC

## 2016-10-15 NOTE — Telephone Encounter (Signed)
Reordered medication.

## 2016-10-22 ENCOUNTER — Other Ambulatory Visit: Payer: Self-pay | Admitting: *Deleted

## 2016-10-22 MED ORDER — TAMOXIFEN CITRATE 20 MG PO TABS: 20.0000 mg | ORAL_TABLET | Freq: Every day | ORAL | 1 refills | Status: DC

## 2016-10-31 ENCOUNTER — Telehealth: Payer: Self-pay | Admitting: *Deleted

## 2016-10-31 NOTE — Telephone Encounter (Signed)
Called to inquire if she should reschedule her appt for [facility] Friday, she has shingles. She has been on medication for 2 - 3 weeks, but has had another outbreak.  Per Dr Mike Gip, reschedule appt, not to be in clinic until rash has dried up and crusted over. Mr Bandemer informed and he asked I cancel appt and he stated he will call back when she has cleared

## 2016-11-02 ENCOUNTER — Inpatient Hospital Stay: Payer: Medicare Other

## 2016-11-02 ENCOUNTER — Inpatient Hospital Stay: Payer: Medicare Other | Admitting: Hematology and Oncology

## 2017-01-01 ENCOUNTER — Ambulatory Visit: Payer: Medicare Other | Admitting: Podiatry

## 2017-01-14 ENCOUNTER — Ambulatory Visit (INDEPENDENT_AMBULATORY_CARE_PROVIDER_SITE_OTHER): Payer: Medicare Other | Admitting: Podiatry

## 2017-01-14 ENCOUNTER — Encounter: Payer: Self-pay | Admitting: Podiatry

## 2017-01-14 DIAGNOSIS — B351 Tinea unguium: Secondary | ICD-10-CM

## 2017-01-14 DIAGNOSIS — E0843 Diabetes mellitus due to underlying condition with diabetic autonomic (poly)neuropathy: Secondary | ICD-10-CM

## 2017-01-14 DIAGNOSIS — Q828 Other specified congenital malformations of skin: Secondary | ICD-10-CM | POA: Diagnosis not present

## 2017-01-14 NOTE — Progress Notes (Signed)
Complaint:  Visit Type: Patient returns to my office for continued preventative foot care services. Complaint: Patient states" my nails have grown long and thick and become painful to walk and wear shoes" Patient has been diagnosed with DM with no foot complications. The patient presents for preventative foot care services. No changes to ROS.  She has painful callus both feet.  Podiatric Exam: Vascular: dorsalis pedis and posterior tibial pulses are palpable bilateral. Capillary return is immediate. Temperature gradient is WNL. Skin turgor WNL  Sensorium: Normal Semmes Weinstein monofilament test. Normal tactile sensation bilaterally. Nail Exam: Pt has thick disfigured discolored nails with subungual debris noted bilateral entire nail hallux through fifth toenails Ulcer Exam: There is no evidence of ulcer or pre-ulcerative changes or infection. Orthopedic Exam: Muscle tone and strength are WNL. No limitations in general ROM. No crepitus or effusions noted. Foot type and digits show no abnormalities. Bony prominences are unremarkable. Skin:  Porokeratosis sub 2  B/L and sub 4,5 left foot.  Heel callus left foot. No infection or ulcers  Diagnosis:  Onychomycosis, , Pain in right toe, pain in left toes.  Porokeratosis  B/L  Treatment & Plan Procedures and Treatment: Consent by patient was obtained for treatment procedures. The patient understood the discussion of treatment and procedures well. All questions were answered thoroughly reviewed. Debridement of mycotic and hypertrophic toenails, 1 through 5 bilateral and clearing of subungual debris. No ulceration, no infection noted. Debride porokeratosis  B/L Return Visit-Office Procedure: Patient instructed to return to the office for a follow up visit 3 months for continued evaluation and treatment.    Yasmene Salomone DPM 

## 2017-03-19 ENCOUNTER — Other Ambulatory Visit: Payer: Self-pay | Admitting: Internal Medicine

## 2017-03-19 DIAGNOSIS — Z1231 Encounter for screening mammogram for malignant neoplasm of breast: Secondary | ICD-10-CM

## 2017-04-20 ENCOUNTER — Other Ambulatory Visit: Payer: Self-pay | Admitting: Hematology and Oncology

## 2017-04-22 ENCOUNTER — Ambulatory Visit (INDEPENDENT_AMBULATORY_CARE_PROVIDER_SITE_OTHER): Payer: Medicare Other | Admitting: Podiatry

## 2017-04-22 ENCOUNTER — Encounter: Payer: Self-pay | Admitting: Podiatry

## 2017-04-22 DIAGNOSIS — Q828 Other specified congenital malformations of skin: Secondary | ICD-10-CM

## 2017-04-22 DIAGNOSIS — B351 Tinea unguium: Secondary | ICD-10-CM

## 2017-04-22 DIAGNOSIS — E119 Type 2 diabetes mellitus without complications: Secondary | ICD-10-CM

## 2017-04-22 NOTE — Progress Notes (Signed)
Complaint:  Visit Type: Patient returns to my office for continued preventative foot care services. Complaint: Patient states" my nails have grown long and thick and become painful to walk and wear shoes" Patient has been diagnosed with DM with no foot complications. The patient presents for preventative foot care services. No changes to ROS.  She has painful callus both feet.  Podiatric Exam: Vascular: dorsalis pedis and posterior tibial pulses are palpable bilateral. Capillary return is immediate. Temperature gradient is WNL. Skin turgor WNL  Sensorium: Normal Semmes Weinstein monofilament test. Normal tactile sensation bilaterally. Nail Exam: Pt has thick disfigured discolored nails with subungual debris noted bilateral entire nail hallux through fifth toenails Ulcer Exam: There is no evidence of ulcer or pre-ulcerative changes or infection. Orthopedic Exam: Muscle tone and strength are WNL. No limitations in general ROM. No crepitus or effusions noted. Foot type and digits show no abnormalities. Bony prominences are unremarkable. Skin:  Porokeratosis sub 2  B/L and sub 4,5 left foot.  Heel callus left foot. No infection or ulcers  Diagnosis:  Onychomycosis, , Pain in right toe, pain in left toes.  Porokeratosis  B/L  Treatment & Plan Procedures and Treatment: Consent by patient was obtained for treatment procedures. The patient understood the discussion of treatment and procedures well. All questions were answered thoroughly reviewed. Debridement of mycotic and hypertrophic toenails, 1 through 5 bilateral and clearing of subungual debris. No ulceration, no infection noted. Debride porokeratosis  B/L Return Visit-Office Procedure: Patient instructed to return to the office for a follow up visit 3 months for continued evaluation and treatment.    Gardiner Barefoot DPM

## 2017-04-24 ENCOUNTER — Ambulatory Visit
Admission: RE | Admit: 2017-04-24 | Discharge: 2017-04-24 | Disposition: A | Discharge status: Home / Self Care | Payer: Medicare Other | Source: Ambulatory Visit | Attending: Internal Medicine | Admitting: Internal Medicine

## 2017-04-24 DIAGNOSIS — Z1231 Encounter for screening mammogram for malignant neoplasm of breast: Secondary | ICD-10-CM | POA: Diagnosis present

## 2017-04-24 DIAGNOSIS — Z853 Personal history of malignant neoplasm of breast: Secondary | ICD-10-CM | POA: Insufficient documentation

## 2017-07-29 ENCOUNTER — Ambulatory Visit (INDEPENDENT_AMBULATORY_CARE_PROVIDER_SITE_OTHER): Payer: Medicare Other | Admitting: Podiatry

## 2017-07-29 DIAGNOSIS — Q828 Other specified congenital malformations of skin: Secondary | ICD-10-CM

## 2017-07-29 DIAGNOSIS — E119 Type 2 diabetes mellitus without complications: Secondary | ICD-10-CM

## 2017-07-29 DIAGNOSIS — B351 Tinea unguium: Secondary | ICD-10-CM

## 2017-07-29 NOTE — Progress Notes (Signed)
Complaint:  Visit Type: Patient returns to my office for continued preventative foot care services. Complaint: Patient states" my nails have grown long and thick and become painful to walk and wear shoes" Patient has been diagnosed with DM with no foot complications. The patient presents for preventative foot care services. No changes to ROS.  She has painful callus both feet.  Podiatric Exam: Vascular: dorsalis pedis and posterior tibial pulses are palpable bilateral. Capillary return is immediate. Temperature gradient is WNL. Skin turgor WNL  Sensorium: Normal Semmes Weinstein monofilament test. Normal tactile sensation bilaterally. Nail Exam: Pt has thick disfigured discolored nails with subungual debris noted bilateral entire nail hallux through fifth toenails Ulcer Exam: There is no evidence of ulcer or pre-ulcerative changes or infection. Orthopedic Exam: Muscle tone and strength are WNL. No limitations in general ROM. No crepitus or effusions noted. Foot type and digits show no abnormalities. Bony prominences are unremarkable. Skin:  Porokeratosis sub 2  B/L and sub 4,5 left foot.   No infection or ulcers  Diagnosis:  Onychomycosis, , Pain in right toe, pain in left toes.  Porokeratosis  B/L  Treatment & Plan Procedures and Treatment: Consent by patient was obtained for treatment procedures. The patient understood the discussion of treatment and procedures well. All questions were answered thoroughly reviewed. Debridement of mycotic and hypertrophic toenails, 1 through 5 bilateral and clearing of subungual debris. No ulceration, no infection noted. Debride porokeratosis  B/L Return Visit-Office Procedure: Patient instructed to return to the office for a follow up visit 3 months for continued evaluation and treatment.    Gardiner Barefoot DPM

## 2017-07-31 ENCOUNTER — Inpatient Hospital Stay
Admission: EM | Admit: 2017-07-31 | Discharge: 2017-08-02 | DRG: 638 | Disposition: A | Discharge status: Home / Self Care | Payer: Medicare Other | Attending: Internal Medicine | Admitting: Internal Medicine

## 2017-07-31 ENCOUNTER — Encounter: Payer: Self-pay | Admitting: Emergency Medicine

## 2017-07-31 DIAGNOSIS — Z882 Allergy status to sulfonamides status: Secondary | ICD-10-CM | POA: Diagnosis not present

## 2017-07-31 DIAGNOSIS — Z9011 Acquired absence of right breast and nipple: Secondary | ICD-10-CM | POA: Diagnosis not present

## 2017-07-31 DIAGNOSIS — Z794 Long term (current) use of insulin: Secondary | ICD-10-CM | POA: Diagnosis not present

## 2017-07-31 DIAGNOSIS — Z79899 Other long term (current) drug therapy: Secondary | ICD-10-CM

## 2017-07-31 DIAGNOSIS — Z888 Allergy status to other drugs, medicaments and biological substances status: Secondary | ICD-10-CM

## 2017-07-31 DIAGNOSIS — Z8249 Family history of ischemic heart disease and other diseases of the circulatory system: Secondary | ICD-10-CM | POA: Diagnosis not present

## 2017-07-31 DIAGNOSIS — Z90710 Acquired absence of both cervix and uterus: Secondary | ICD-10-CM

## 2017-07-31 DIAGNOSIS — E1010 Type 1 diabetes mellitus with ketoacidosis without coma: Secondary | ICD-10-CM

## 2017-07-31 DIAGNOSIS — E871 Hypo-osmolality and hyponatremia: Secondary | ICD-10-CM | POA: Diagnosis present

## 2017-07-31 DIAGNOSIS — R1110 Vomiting, unspecified: Secondary | ICD-10-CM

## 2017-07-31 DIAGNOSIS — Z681 Body mass index (BMI) 19 or less, adult: Secondary | ICD-10-CM

## 2017-07-31 DIAGNOSIS — Z853 Personal history of malignant neoplasm of breast: Secondary | ICD-10-CM | POA: Diagnosis not present

## 2017-07-31 DIAGNOSIS — E1110 Type 2 diabetes mellitus with ketoacidosis without coma: Secondary | ICD-10-CM | POA: Diagnosis present

## 2017-07-31 DIAGNOSIS — E440 Moderate protein-calorie malnutrition: Secondary | ICD-10-CM | POA: Diagnosis present

## 2017-07-31 DIAGNOSIS — Z9841 Cataract extraction status, right eye: Secondary | ICD-10-CM | POA: Diagnosis not present

## 2017-07-31 DIAGNOSIS — N179 Acute kidney failure, unspecified: Secondary | ICD-10-CM | POA: Diagnosis present

## 2017-07-31 DIAGNOSIS — M810 Age-related osteoporosis without current pathological fracture: Secondary | ICD-10-CM | POA: Diagnosis present

## 2017-07-31 DIAGNOSIS — Z961 Presence of intraocular lens: Secondary | ICD-10-CM | POA: Diagnosis present

## 2017-07-31 DIAGNOSIS — E785 Hyperlipidemia, unspecified: Secondary | ICD-10-CM | POA: Diagnosis present

## 2017-07-31 DIAGNOSIS — R197 Diarrhea, unspecified: Secondary | ICD-10-CM

## 2017-07-31 DIAGNOSIS — H409 Unspecified glaucoma: Secondary | ICD-10-CM | POA: Diagnosis present

## 2017-07-31 DIAGNOSIS — I10 Essential (primary) hypertension: Secondary | ICD-10-CM | POA: Diagnosis present

## 2017-07-31 LAB — GLUCOSE, CAPILLARY
GLUCOSE-CAPILLARY: 468 mg/dL — AB (ref 65–99)
GLUCOSE-CAPILLARY: 409 mg/dL — AB (ref 65–99)
GLUCOSE-CAPILLARY: 357 mg/dL — AB (ref 65–99)
GLUCOSE-CAPILLARY: 164 mg/dL — AB (ref 65–99)
GLUCOSE-CAPILLARY: 108 mg/dL — AB (ref 65–99)
GLUCOSE-CAPILLARY: 57 mg/dL — AB (ref 65–99)
Glucose-Capillary: 317 mg/dL — ABNORMAL HIGH (ref 65–99)
Glucose-Capillary: 248 mg/dL — ABNORMAL HIGH (ref 65–99)
Glucose-Capillary: 158 mg/dL — ABNORMAL HIGH (ref 65–99)
Glucose-Capillary: 127 mg/dL — ABNORMAL HIGH (ref 65–99)
Glucose-Capillary: 193 mg/dL — ABNORMAL HIGH (ref 65–99)

## 2017-07-31 LAB — COMPREHENSIVE METABOLIC PANEL
ALBUMIN: 3.6 g/dL (ref 3.5–5.0)
ALT: 13 U/L — ABNORMAL LOW (ref 14–54)
ANION GAP: 18 — AB (ref 5–15)
AST: 38 U/L (ref 15–41)
Alkaline Phosphatase: 31 U/L — ABNORMAL LOW (ref 38–126)
BILIRUBIN TOTAL: 1.4 mg/dL — AB (ref 0.3–1.2)
BUN: 42 mg/dL — AB (ref 6–20)
CHLORIDE: 87 mmol/L — AB (ref 101–111)
CO2: 17 mmol/L — ABNORMAL LOW (ref 22–32)
Calcium: 8.3 mg/dL — ABNORMAL LOW (ref 8.9–10.3)
Creatinine, Ser: 1.88 mg/dL — ABNORMAL HIGH (ref 0.44–1.00)
GFR calc Af Amer: 28 mL/min — ABNORMAL LOW (ref >60)
GFR calc non Af Amer: 24 mL/min — ABNORMAL LOW (ref >60)
Glucose, Bld: 544 mg/dL (ref 65–99)
POTASSIUM: 4.1 mmol/L (ref 3.5–5.1)
Sodium: 122 mmol/L — ABNORMAL LOW (ref 135–145)
TOTAL PROTEIN: 5.9 g/dL — AB (ref 6.5–8.1)

## 2017-07-31 LAB — CBC
HEMATOCRIT: 34.2 % — AB (ref 35.0–47.0)
HEMOGLOBIN: 11.6 g/dL — AB (ref 12.0–16.0)
MCH: 34.1 pg — ABNORMAL HIGH (ref 26.0–34.0)
MCHC: 33.8 g/dL (ref 32.0–36.0)
MCV: 100.8 fL — ABNORMAL HIGH (ref 80.0–100.0)
Platelets: 183 10*3/uL (ref 150–440)
RBC: 3.39 MIL/uL — ABNORMAL LOW (ref 3.80–5.20)
RDW: 13.1 % (ref 11.5–14.5)
WBC: 26.6 10*3/uL — ABNORMAL HIGH (ref 3.6–11.0)

## 2017-07-31 LAB — BASIC METABOLIC PANEL
ANION GAP: 17 — AB (ref 5–15)
ANION GAP: 7 (ref 5–15)
Anion gap: 6 (ref 5–15)
BUN: 39 mg/dL — AB (ref 6–20)
BUN: 36 mg/dL — ABNORMAL HIGH (ref 6–20)
BUN: 31 mg/dL — AB (ref 6–20)
CO2: 16 mmol/L — ABNORMAL LOW (ref 22–32)
CO2: 21 mmol/L — ABNORMAL LOW (ref 22–32)
CO2: 21 mmol/L — ABNORMAL LOW (ref 22–32)
Calcium: 8 mg/dL — ABNORMAL LOW (ref 8.9–10.3)
Calcium: 7.9 mg/dL — ABNORMAL LOW (ref 8.9–10.3)
Calcium: 7.4 mg/dL — ABNORMAL LOW (ref 8.9–10.3)
Chloride: 91 mmol/L — ABNORMAL LOW (ref 101–111)
Chloride: 97 mmol/L — ABNORMAL LOW (ref 101–111)
Chloride: 102 mmol/L (ref 101–111)
Creatinine, Ser: 1.67 mg/dL — ABNORMAL HIGH (ref 0.44–1.00)
Creatinine, Ser: 1.36 mg/dL — ABNORMAL HIGH (ref 0.44–1.00)
Creatinine, Ser: 1.08 mg/dL — ABNORMAL HIGH (ref 0.44–1.00)
GFR calc Af Amer: 42 mL/min — ABNORMAL LOW (ref >60)
GFR calc Af Amer: 55 mL/min — ABNORMAL LOW (ref >60)
GFR calc non Af Amer: 48 mL/min — ABNORMAL LOW (ref >60)
GFR, EST AFRICAN AMERICAN: 33 mL/min — AB (ref >60)
GFR, EST NON AFRICAN AMERICAN: 28 mL/min — AB (ref >60)
GFR, EST NON AFRICAN AMERICAN: 36 mL/min — AB (ref >60)
Glucose, Bld: 425 mg/dL — ABNORMAL HIGH (ref 65–99)
Glucose, Bld: 152 mg/dL — ABNORMAL HIGH (ref 65–99)
Glucose, Bld: 87 mg/dL (ref 65–99)
POTASSIUM: 3.8 mmol/L (ref 3.5–5.1)
POTASSIUM: 4 mmol/L (ref 3.5–5.1)
Potassium: 3.3 mmol/L — ABNORMAL LOW (ref 3.5–5.1)
SODIUM: 124 mmol/L — AB (ref 135–145)
SODIUM: 125 mmol/L — AB (ref 135–145)
SODIUM: 129 mmol/L — AB (ref 135–145)

## 2017-07-31 LAB — URINALYSIS, COMPLETE (UACMP) WITH MICROSCOPIC
BACTERIA UA: NONE SEEN
Glucose, UA: >1000 mg/dL — AB
HGB URINE DIPSTICK: NEGATIVE
KETONES UR: 40 mg/dL — AB
NITRITE: NEGATIVE
Protein, ur: NEGATIVE mg/dL
SQUAMOUS EPITHELIAL / LPF: NONE SEEN
Specific Gravity, Urine: 1.015 (ref 1.005–1.030)
pH: 5.5 (ref 5.0–8.0)

## 2017-07-31 LAB — LIPASE, BLOOD: LIPASE: 17 U/L (ref 11–51)

## 2017-07-31 LAB — MRSA PCR SCREENING: MRSA BY PCR: NEGATIVE

## 2017-07-31 LAB — MAGNESIUM: Magnesium: 1.9 mg/dL (ref 1.7–2.4)

## 2017-07-31 LAB — PROCALCITONIN: Procalcitonin: 10.68 ng/mL

## 2017-07-31 MED ORDER — INSULIN GLARGINE 100 UNIT/ML ~~LOC~~ SOLN
5.0000 [IU] | Freq: Every day | SUBCUTANEOUS | Status: DC
  Administered 2017-07-31 – 2017-08-02 (×3): 5 [IU] via SUBCUTANEOUS
  Filled 2017-07-31 (×4): qty 0.05

## 2017-07-31 MED ORDER — ONDANSETRON HCL 4 MG PO TABS: 4.0000 mg | ORAL_TABLET | Freq: Four times a day (QID) | ORAL | Status: DC | PRN

## 2017-07-31 MED ORDER — DEXTROSE-NACL 5-0.45 % IV SOLN
INTRAVENOUS | Status: DC
  Administered 2017-07-31: 16:00:00 via INTRAVENOUS

## 2017-07-31 MED ORDER — PREDNISOLONE ACETATE 1 % OP SUSP
1.0000 [drp] | Freq: Four times a day (QID) | OPHTHALMIC | Status: DC
  Filled 2017-07-31: qty 1

## 2017-07-31 MED ORDER — ACETAMINOPHEN 325 MG PO TABS: 650.0000 mg | ORAL_TABLET | Freq: Four times a day (QID) | ORAL | Status: DC | PRN

## 2017-07-31 MED ORDER — ONDANSETRON 4 MG PO TBDP
4.0000 mg | ORAL_TABLET | Freq: Once | ORAL | Status: AC | PRN
  Administered 2017-07-31: 4 mg via ORAL
  Filled 2017-07-31: qty 1

## 2017-07-31 MED ORDER — SODIUM CHLORIDE 0.9 % IV SOLN
INTRAVENOUS | Status: DC
  Administered 2017-07-31: 14:00:00 via INTRAVENOUS

## 2017-07-31 MED ORDER — SODIUM CHLORIDE 0.9 % IV SOLN
INTRAVENOUS | Status: DC
  Administered 2017-07-31: 4.1 [IU]/h via INTRAVENOUS
  Filled 2017-07-31: qty 1

## 2017-07-31 MED ORDER — POTASSIUM CHLORIDE CRYS ER 20 MEQ PO TBCR
20.0000 meq | EXTENDED_RELEASE_TABLET | Freq: Once | ORAL | Status: AC
  Administered 2017-07-31: 20 meq via ORAL
  Filled 2017-07-31: qty 1

## 2017-07-31 MED ORDER — ACETAMINOPHEN 650 MG RE SUPP: 650.0000 mg | Freq: Four times a day (QID) | RECTAL | Status: DC | PRN

## 2017-07-31 MED ORDER — SODIUM CHLORIDE 0.9 % IV BOLUS (SEPSIS)
1000.0000 mL | Freq: Once | INTRAVENOUS | Status: AC
  Administered 2017-07-31: 1000 mL via INTRAVENOUS

## 2017-07-31 MED ORDER — POTASSIUM CHLORIDE 20 MEQ PO PACK
20.0000 meq | PACK | Freq: Once | ORAL | Status: AC
  Administered 2017-07-31: 20 meq via ORAL
  Filled 2017-07-31: qty 1

## 2017-07-31 MED ORDER — DEXTROSE 50 % IV SOLN
1.0000 | Freq: Once | INTRAVENOUS | Status: AC
  Administered 2017-07-31: 50 mL via INTRAVENOUS
  Filled 2017-07-31: qty 50

## 2017-07-31 MED ORDER — ONDANSETRON HCL 4 MG/2ML IJ SOLN
4.0000 mg | Freq: Once | INTRAMUSCULAR | Status: AC
  Administered 2017-07-31: 4 mg via INTRAVENOUS
  Filled 2017-07-31: qty 2

## 2017-07-31 MED ORDER — ENOXAPARIN SODIUM 30 MG/0.3ML ~~LOC~~ SOLN
30.0000 mg | SUBCUTANEOUS | Status: DC
  Administered 2017-07-31 – 2017-08-02 (×2): 30 mg via SUBCUTANEOUS
  Filled 2017-07-31 (×2): qty 0.3

## 2017-07-31 MED ORDER — MOXIFLOXACIN HCL 0.5 % OP SOLN
1.0000 [drp] | Freq: Four times a day (QID) | OPHTHALMIC | Status: DC
  Filled 2017-07-31: qty 3

## 2017-07-31 MED ORDER — BRIMONIDINE TARTRATE 0.2 % OP SOLN
1.0000 [drp] | Freq: Three times a day (TID) | OPHTHALMIC | Status: DC
  Administered 2017-07-31 – 2017-08-02 (×6): 1 [drp] via OPHTHALMIC
  Filled 2017-07-31: qty 5

## 2017-07-31 MED ORDER — ONDANSETRON HCL 4 MG/2ML IJ SOLN: 4.0000 mg | Freq: Four times a day (QID) | INTRAMUSCULAR | Status: DC | PRN

## 2017-07-31 MED ORDER — INSULIN ASPART 100 UNIT/ML ~~LOC~~ SOLN
0.0000 [IU] | Freq: Three times a day (TID) | SUBCUTANEOUS | Status: DC
  Administered 2017-08-01 (×2): 2 [IU] via SUBCUTANEOUS
  Administered 2017-08-01 – 2017-08-02 (×2): 3 [IU] via SUBCUTANEOUS
  Administered 2017-08-02: 5 [IU] via SUBCUTANEOUS
  Filled 2017-07-31 (×5): qty 1

## 2017-07-31 MED ORDER — TAMOXIFEN CITRATE 10 MG PO TABS
20.0000 mg | ORAL_TABLET | Freq: Every day | ORAL | Status: DC
  Administered 2017-07-31 – 2017-08-02 (×3): 20 mg via ORAL
  Filled 2017-07-31 (×3): qty 2

## 2017-07-31 MED ORDER — DORZOLAMIDE HCL-TIMOLOL MAL 2-0.5 % OP SOLN
1.0000 [drp] | Freq: Two times a day (BID) | OPHTHALMIC | Status: DC
  Administered 2017-07-31 – 2017-08-02 (×5): 1 [drp] via OPHTHALMIC
  Filled 2017-07-31: qty 10

## 2017-07-31 NOTE — ED Provider Notes (Signed)
[facility] Emergency Department Provider Note  ____________________________________________  Time seen: Approximately 11:55 AM  I have reviewed the triage vital signs and the nursing notes.   HISTORY  Chief Complaint Emesis   HPI [name] [name] is a 76 y.o. female with a history of type 1 diabetes, hypertension, hyperlipidemia who presents for evaluation of nausea, vomiting, diarrhea. Patient reports that she was sick yesterday the entire day. She had several episodes of nonbloody nonbilious emesis and a few episodes of watery diarrhea. Symptoms were constant and severe yesterday. Since yesterday evening she has noted that her sugars have been elevated. She denies abdominal pain, fever, chills, dysuria or hematuria, chest pain, cough, congestion, shortness of breath. She feels very dehydrated. She reports that she felt very lightheaded yesterday however that has resolved today. She denies any episodes of vomiting or diarrhea today.  Past Medical History:  Diagnosis Date  . Breast cancer ([place]) 2011   RT MASTECTOMY  . Cancer ([place]) 2011   BREAST CA  . diabetes insulin dep   . High cholesterol   . Hypertension   . Hypothyroidism   . Osteoporosis   . Thyroid disease     Patient Active Problem List   Diagnosis Date Noted  . DKA (diabetic ketoacidoses) ([place]) 07/31/2017  . Hyponatremia 06/18/2016  . Osteoporosis 04/23/2016  . Malignant neoplasm of breast ([place]) 03/30/2016  . HLD (hyperlipidemia) 03/30/2016  . BP (high blood pressure) 03/30/2016  . Adult hypothyroidism 03/30/2016  . Type 1 diabetes mellitus with hypoglycemia and without coma ([place]) 02/10/2016  . Type 1 diabetes mellitus without complication ([place]) 61/44/3154  . Dermatophytosis of nail 08/24/2013  . Pain in lower limb 08/24/2013    Past Surgical History:  Procedure Laterality Date  . BREAST BIOPSY Right 2011   positive  . CATARACT EXTRACTION W/PHACO Right 06/13/2016   Procedure:  CATARACT EXTRACTION PHACO AND INTRAOCULAR LENS PLACEMENT (IOC);  Surgeon: Estill Cotta, MD;  Location: [facility] ORS;  Service: Ophthalmology;  Laterality: Right;  [place] 03:07 AP% 27.4 CDE 93.21 fluid pack lot # [id number]  . MASTECTOMY Right 2011   positive  . TONSILLECTOMY    . TOTAL ABDOMINAL HYSTERECTOMY      Prior to Admission medications   Medication Sig Start Date End Date Taking? Authorizing Provider  amLODipine (NORVASC) 5 MG tablet Take 1 tablet by mouth daily. 05/07/16   [provider]  brimonidine (ALPHAGAN) 0.2 % ophthalmic solution Place 1 drop into the right eye 3 (three) times daily. 06/21/16   Gouru, Illene Silver, MD  calcium-vitamin D (OSCAL-500) 500-400 MG-UNIT per tablet Take 1 tablet by mouth daily.     [provider]  denosumab (PROLIA) 60 MG/ML SOLN injection Inject 60 mg into the skin every 6 (six) months. 05/14/16   [provider]  dorzolamide-timolol (COSOPT) 22.3-6.8 MG/ML ophthalmic solution Place 1 drop into the right eye 2 (two) times daily. 06/21/16   Nicholes Mango, MD  DUREZOL 0.05 % EMUL  05/23/16   [provider]  enalapril (VASOTEC) 20 MG tablet Take 20 mg by mouth daily.    [provider]  feeding supplement, GLUCERNA SHAKE, (GLUCERNA SHAKE) LIQD Take 237 mLs by mouth 2 (two) times daily at 8 am and 10 pm. 06/21/16   Gouru, Illene Silver, MD  insulin glargine (LANTUS) 100 UNIT/ML injection Inject 0.05 mLs (5 Units total) into the skin at bedtime. 06/21/16   Gouru, Illene Silver, MD  insulin lispro (HUMALOG) 100 UNIT/ML injection Inject 0.03 mLs (3 Units total)  into the skin 3 (three) times daily with meals. 06/21/16   Gouru, Illene Silver, MD  lovastatin (MEVACOR) 20 MG tablet Take 20 mg by mouth at bedtime.    [provider]  moxifloxacin (VIGAMOX) 0.5 % ophthalmic solution Place 1 drop into the right eye 4 (four) times daily. 06/21/16   Nicholes Mango, MD  prednisoLONE acetate (PRED FORTE) 1 % ophthalmic suspension Place 1 drop into the right eye 4  (four) times daily. 06/21/16   Nicholes Mango, MD  tamoxifen (NOLVADEX) 20 MG tablet TAKE 1 TABLET DAILY 04/20/17   Lequita Asal, MD    Allergies Sulfa antibiotics; Sulfasalazine; and Lisinopril  Family History  Problem Relation Age of Onset  . Heart disease Father        heart attack  . Breast cancer Neg Hx     Social History Social History  Substance Use Topics  . Smoking status: Never Smoker  . Smokeless tobacco: Never Used  . Alcohol use No    Review of Systems  Constitutional: Negative for fever. Eyes: Negative for visual changes. ENT: Negative for sore throat. Neck: No neck pain  Cardiovascular: Negative for chest pain. Respiratory: Negative for shortness of breath. Gastrointestinal: Negative for abdominal pain. + vomiting and diarrhea. Genitourinary: Negative for dysuria. Musculoskeletal: Negative for back pain. Skin: Negative for rash. Neurological: Negative for headaches, weakness or numbness. Psych: No SI or HI  ____________________________________________   PHYSICAL EXAM:  VITAL SIGNS: ED Triage Vitals [07/31/17 1030]  Enc Vitals Group     BP (!) 104/47     Pulse Rate 96     Resp 16     Temp 98.9 F (37.2 C)     Temp Source Oral     SpO2 98 %     Weight 98 lb (44.5 kg)     Height 5\' 4"  (1.626 m)     Head Circumference      Peak Flow      Pain Score      Pain Loc      Pain Edu?      Excl. in [place]?     Constitutional: Alert and oriented. Well appearing and in no apparent distress. HEENT:      Head: Normocephalic and atraumatic.         Eyes: Conjunctivae are normal. Sclera is non-icteric.       Mouth/Throat: Mucous membranes are dry.       Neck: Supple with no signs of meningismus. Cardiovascular: Regular rate and rhythm. No murmurs, gallops, or rubs. 2+ symmetrical distal pulses are present in all extremities. No JVD. Respiratory: Normal respiratory effort. Lungs are clear to auscultation bilaterally. No wheezes, crackles, or rhonchi.    Gastrointestinal: Soft, non tender, and non distended with positive bowel sounds. No rebound or guarding. Musculoskeletal: Nontender with normal range of motion in all extremities. No edema, cyanosis, or erythema of extremities. Neurologic: Normal speech and language. Face is symmetric. Moving all extremities. No gross focal neurologic deficits are appreciated. Skin: Skin is warm, dry and intact. No rash noted. Psychiatric: Mood and affect are normal. Speech and behavior are normal.  ____________________________________________   LABS (all labs ordered are listed, but only abnormal results are displayed)  Labs Reviewed  COMPREHENSIVE METABOLIC PANEL - Abnormal; Notable for the following:       Result Value   Sodium 122 (*)    Chloride 87 (*)    CO2 17 (*)    Glucose, Bld 544 (*)    BUN  42 (*)    Creatinine, Ser 1.88 (*)    Calcium 8.3 (*)    Total Protein 5.9 (*)    ALT 13 (*)    Alkaline Phosphatase 31 (*)    Total Bilirubin 1.4 (*)    GFR calc non Af Amer 24 (*)    GFR calc Af Amer 28 (*)    Anion gap 18 (*)    All other components within normal limits  CBC - Abnormal; Notable for the following:    WBC 26.6 (*)    RBC 3.39 (*)    Hemoglobin 11.6 (*)    HCT 34.2 (*)    MCV 100.8 (*)    MCH 34.1 (*)    All other components within normal limits  URINE CULTURE  LIPASE, BLOOD  URINALYSIS, COMPLETE (UACMP) WITH MICROSCOPIC   ____________________________________________  EKG  ED ECG REPORT I, Rudene Re, the attending physician, personally viewed and interpreted this ECG.  Normal sinus rhythm, rate of 85, normal intervals, right axis deviation, T-wave inversions in inferior lateral leads which are unchanged from prior, no ST elevation.  ____________________________________________  RADIOLOGY  none ____________________________________________   PROCEDURES  Procedure(s) performed: None Procedures Critical Care performed: yes  CRITICAL CARE Performed  by: Rudene Re  ?  Total critical care time: 40 min  Critical care time was exclusive of separately billable procedures and treating other patients.  Critical care was necessary to treat or prevent imminent or life-threatening deterioration.  Critical care was time spent personally by me on the following activities: development of treatment plan with patient and/or surrogate as well as nursing, discussions with consultants, evaluation of patient's response to treatment, examination of patient, obtaining history from patient or surrogate, ordering and performing treatments and interventions, ordering and review of laboratory studies, ordering and review of radiographic studies, pulse oximetry and re-evaluation of patient's condition. ____________________________________________   INITIAL IMPRESSION / ASSESSMENT AND PLAN / ED COURSE  76 y.o. female with a history of type 1 diabetes, hypertension, hyperlipidemia who presents for evaluation of nausea, vomiting, diarrhea and elevated BG. patient looks dry on exam, hemodynamically stable, her abdomen is soft with no tenderness throughout, lungs are clear to auscultation. Blood work concerning for [employer] acute kidney injury. Patient has a sugar of 544, bicarbonate 17, anion gap of 18, and creatinine of 1.88 (baseline is 0.6). Presentation concerning for DKA in the setting of viral gastroenteritis. Patient has no tenderness on her abdomen therefore we'll hold off on imaging at this time. Her urine is pending. EKG is pending to rule out ACS as possible cause of her nausea and vomiting. Plan to admit to the hospitalist service. IV fluids and insulin drip been ordered.     Pertinent labs & imaging results that were available during my care of the patient were reviewed by me and considered in my medical decision making (see chart for details).    ____________________________________________   FINAL CLINICAL IMPRESSION(S) / ED DIAGNOSES  Final  diagnoses:  Diabetic ketoacidosis without coma associated with type 1 diabetes mellitus (HCC)  AKI (acute kidney injury) ([place])  Vomiting and diarrhea      NEW MEDICATIONS STARTED DURING THIS VISIT:  New Prescriptions   No medications on file     Note:  This document was prepared using Dragon voice recognition software and may include unintentional dictation errors.    Alfred Levins, [place], MD 07/31/17 [phone]

## 2017-07-31 NOTE — ED Notes (Signed)
Date and time results received: 07/31/17 1105 (use smartphrase ".now" to insert current time)  Test: gluocose Critical Value: [place]  Name of Provider Notified: Dr Alfred Levins  Orders Received? Or Actions Taken?: pt placed in major bed

## 2017-07-31 NOTE — Progress Notes (Signed)
Pharmacy Electrolyte Note  76 year old female admitted with DKA and started on insulin drip. Pharmacy consulted to monitor and replace electrolytes.   Sodium (mmol/L)  Date Value  07/31/2017 125 (L)  09/28/2014 133 (L)   Potassium (mmol/L)  Date Value  07/31/2017 3.3 (L)  09/28/2014 4.4   Magnesium (mg/dL)  Date Value  07/31/2017 1.9   Calcium (mg/dL)  Date Value  07/31/2017 7.9 (L)   Calcium, Total (mg/dL)  Date Value  09/28/2014 9.0   Albumin (g/dL)  Date Value  07/31/2017 3.6  09/28/2014 3.8   Goal: K > or = 4 while on insulin drip  Will replace K with 20 meq oral KCl and f/u next BMET.   Pernell Dupre, PharmD, BCPS Clinical Pharmacist 07/31/2017 6:11 PM

## 2017-07-31 NOTE — Progress Notes (Signed)
Awaiting Lantus insulin dose from pharmacy for transition off.  Pt does not have much appetite now. Will try vegetable soup and crackers. NS IV bolus started for low bp.

## 2017-07-31 NOTE — H&P (Signed)
[facility] NAME: [name] [name]    MR#:  [id number]  DATE OF BIRTH:  [birth year]  DATE OF ADMISSION:  07/31/2017  PRIMARY CARE PHYSICIAN: Tracie Harrier, MD   REQUESTING/REFERRING PHYSICIAN: Rudene Re, MD  CHIEF COMPLAINT:   Nausea and vomiting  HISTORY OF PRESENT ILLNESS:  [name] [name]  is a 76 y.o. female with a known history of insulin requiring diabetes mellitus, hypertension, hyperlipidemia has been having nausea and vomiting for the past 2 days which is resolved now. Denies any diarrhea and last bowel movement was yesterday. Patient's blood glucose is at 540 with anion gap at 18 and bicarbonate at 17. Patient was given IV fluid bolus and started on insulin drip and hospitalist team is called to admit the patient Advised patient of provider's approval for requested procedure, as well as any comments/instructions from provider.  Pt is  weak and lethargic during my examination but answers questions. Husband is at bedside  PAST MEDICAL HISTORY:   Past Medical History:  Diagnosis Date  . Breast cancer ([place]) 2011   RT MASTECTOMY  . Cancer ([place]) 2011   BREAST CA  . diabetes insulin dep   . High cholesterol   . Hypertension   . Hypothyroidism   . Osteoporosis   . Thyroid disease     PAST SURGICAL HISTOIRY:   Past Surgical History:  Procedure Laterality Date  . BREAST BIOPSY Right 2011   positive  . CATARACT EXTRACTION W/PHACO Right 06/13/2016   Procedure: CATARACT EXTRACTION PHACO AND INTRAOCULAR LENS PLACEMENT (IOC);  Surgeon: Estill Cotta, MD;  Location: [facility] ORS;  Service: Ophthalmology;  Laterality: Right;  [place] 03:07 AP% 27.4 CDE 93.21 fluid pack lot # [id number]  . MASTECTOMY Right 2011   positive  . TONSILLECTOMY    . TOTAL ABDOMINAL HYSTERECTOMY      SOCIAL HISTORY:   Social History  Substance Use Topics  . Smoking status: Never Smoker  . Smokeless tobacco: Never Used  . Alcohol use No     FAMILY HISTORY:   Family History  Problem Relation Age of Onset  . Heart disease Father        heart attack  . Breast cancer Neg Hx     DRUG ALLERGIES:   Allergies  Allergen Reactions  . Sulfa Antibiotics Other (See Comments)    possible possible  . Sulfasalazine   . Lisinopril Rash    Hyponatremia  Hyponatremia     REVIEW OF SYSTEMS:  CONSTITUTIONAL: reporting tiredness and weakness. Denies any fever EYES: No blurred or double vision.  EARS, NOSE, AND THROAT: No tinnitus or ear pain.  RESPIRATORY: No cough, shortness of breath, wheezing or hemoptysis.  CARDIOVASCULAR: No chest pain, orthopnea, edema.  GASTROINTESTINAL: reports nausea, vomiting yesterday , denies diarrhea GENITOURINARY: No dysuria, hematuria.  ENDOCRINE: No polyuria, nocturia,  HEMATOLOGY: No anemia, easy bruising or bleeding SKIN: No rash or lesion. MUSCULOSKELETAL: No joint pain or arthritis.   NEUROLOGIC: No tingling, numbness, weakness.  PSYCHIATRY: No anxiety or depression.   MEDICATIONS AT HOME:   Prior to Admission medications   Medication Sig Start Date End Date Taking? Authorizing Provider  amLODipine (NORVASC) 5 MG tablet Take 1 tablet by mouth daily. 05/07/16  Yes [provider]  DUREZOL 0.05 % EMUL  05/23/16  Yes [provider]  enalapril (VASOTEC) 20 MG tablet Take 20 mg by mouth daily.   Yes [provider]  insulin glargine (LANTUS) 100 UNIT/ML injection Inject  0.05 mLs (5 Units total) into the skin at bedtime. 06/21/16  Yes Monserrate Blaschke, MD  insulin lispro (HUMALOG) 100 UNIT/ML injection Inject 0.03 mLs (3 Units total) into the skin 3 (three) times daily with meals. 06/21/16  Yes Ikenna Ohms, Illene Silver, MD  lovastatin (MEVACOR) 20 MG tablet Take 20 mg by mouth at bedtime.   Yes [provider]  moxifloxacin (VIGAMOX) 0.5 % ophthalmic solution Place 1 drop into the right eye 4 (four) times daily. 06/21/16  Yes Thelonious Kauffmann, Illene Silver, MD  tamoxifen (NOLVADEX) 20 MG tablet  TAKE 1 TABLET DAILY 04/20/17  Yes Corcoran, Melissa C, MD  brimonidine (ALPHAGAN) 0.2 % ophthalmic solution Place 1 drop into the right eye 3 (three) times daily. Patient not taking: Reported on 07/31/2017 06/21/16   Nicholes Mango, MD  denosumab (PROLIA) 60 MG/ML SOLN injection Inject 60 mg into the skin every 6 (six) months. 05/14/16   [provider]  dorzolamide-timolol (COSOPT) 22.3-6.8 MG/ML ophthalmic solution Place 1 drop into the right eye 2 (two) times daily. Patient not taking: Reported on 07/31/2017 06/21/16   Nicholes Mango, MD  feeding supplement, GLUCERNA SHAKE, (GLUCERNA SHAKE) LIQD Take 237 mLs by mouth 2 (two) times daily at 8 am and 10 pm. 06/21/16   Elmo Rio, Illene Silver, MD  prednisoLONE acetate (PRED FORTE) 1 % ophthalmic suspension Place 1 drop into the right eye 4 (four) times daily. Patient not taking: Reported on 07/31/2017 06/21/16   Nicholes Mango, MD      VITAL SIGNS:  Blood pressure (!) 94/53, pulse 82, temperature 98.3 F (36.8 C), temperature source Oral, resp. rate 13, height 5\' 4"  (1.626 m), weight 46 kg (101 lb 6.6 oz), SpO2 100 %.  PHYSICAL EXAMINATION:  GENERAL:  76 y.o.-year-old patient lying in the bed with no acute distress.  EYES: Pupils equal, round, reactive to light and accommodation. No scleral icterus. Extraocular muscles intact.  HEENT: Head atraumatic, normocephalic. Oropharynx and nasopharynx clear. Dry mucous membranes NECK:  Supple, no jugular venous distention. No thyroid enlargement, no tenderness.  LUNGS: Normal breath sounds bilaterally, no wheezing, rales,rhonchi or crepitation. No use of accessory muscles of respiration.  CARDIOVASCULAR: S1, S2 normal. No murmurs, rubs, or gallops.  ABDOMEN: Soft, generalized abdominal discomfort but no rebound tenderness, nondistended. Bowel sounds present. No organomegaly or mass.  EXTREMITIES: No pedal edema, cyanosis, or clubbing.  NEUROLOGIC: Cranial nerves II through XII are intact. Muscle strength 5/5 in all  extremities. Sensation intact. Gait not checked.  PSYCHIATRIC: The patient is alert and oriented x 3.  SKIN: No obvious rash, lesion, or ulcer.   LABORATORY PANEL:   CBC  Recent Labs Lab 07/31/17 1032  WBC 26.6*  HGB 11.6*  HCT 34.2*  PLT 183   ------------------------------------------------------------------------------------------------------------------  Chemistries   Recent Labs Lab 07/31/17 1032 07/31/17 1319  NA 122* 124*  K 4.1 3.8  CL 87* 91*  CO2 17* 16*  GLUCOSE 544* 425*  BUN 42* 39*  CREATININE 1.88* 1.67*  CALCIUM 8.3* 8.0*  AST 38  --   ALT 13*  --   ALKPHOS 31*  --   BILITOT 1.4*  --    ------------------------------------------------------------------------------------------------------------------  Cardiac Enzymes No results for input(s): TROPONINI in the last 168 hours. ------------------------------------------------------------------------------------------------------------------  RADIOLOGY:  No results found.  EKG:   Orders placed or performed during the hospital encounter of 07/31/17  . ED EKG  . ED EKG  . EKG 12-Lead  . EKG 12-Lead    IMPRESSION AND PLAN:   [name] [name]  is a  76 y.o. female with a known history of insulin requiring diabetes mellitus, hypertension, hyperlipidemia has been having nausea and vomiting for the past 2 days which is resolved now. Denies any diarrhea and last bowel movement was yesterday. Patient's blood glucose is at 540 with anion gap at 18 and bicarbonate at 17. Patient was given IV fluid bolus and started on insulin drip and hospitalist team is called to admit the patient  # DKA Step down unit Aggressive hydration with IV fluids and insulin drip per protocol Serial BMPs Change IV fluids to D5 half-normal saline when blood sugar is at 250 Check hemoglobin A1c Consult diabetic coordinator  #Pseudohyponatremia from DKA Hydrate with IV fluids and monitor serial sodiums, should be auto corrected with  IV fluids  #Acute kidney injury from nausea and vomiting Hydrate with IV fluids and avoid nephrotoxins Monitor renal function closely  #Hypothyroidism check TSH and continue Synthroid  #Essential hypertension Currently hypotensive and nothing by mouth Will hold blood pressure medications   GI prophylaxis with Protonix    All the records are reviewed and case discussed with ED provider. Management plans discussed with the patient, family and they are in agreement.  CODE STATUS: fc   TOTAL  CRITICAL CARE TIME TAKING CARE OF THIS PATIENT: 50  minutes.   Note: This dictation was prepared with Dragon dictation along with smaller phrase technology. Any transcriptional errors that result from this process are unintentional.  Nicholes Mango M.D on 07/31/2017 at 4:37 PM  Between 7am to 6pm - Pager - [phone]  After 6pm go to [email or web address] - password [email or web address] [facility]  Office  [phone]  CC: Primary care physician; Tracie Harrier, MD

## 2017-07-31 NOTE — Progress Notes (Signed)
Pharmacy Electrolyte Note  76 year old female admitted with DKA and started on insulin drip. Pharmacy consulted to monitor and replace electrolytes.   Sodium (mmol/L)  Date Value  07/31/2017 124 (L)  09/28/2014 133 (L)   Potassium (mmol/L)  Date Value  07/31/2017 3.8  09/28/2014 4.4   Magnesium (mg/dL)  Date Value  06/19/2016 2.2   Calcium (mg/dL)  Date Value  07/31/2017 8.0 (L)   Calcium, Total (mg/dL)  Date Value  09/28/2014 9.0   Albumin (g/dL)  Date Value  07/31/2017 3.6  09/28/2014 3.8   Goal: K > or = 4 while on insulin drip  Will replace K with 20 meq oral KCl and f/u next BMET.   Ulice Dash, PharmD Clinical Pharmacist

## 2017-07-31 NOTE — Progress Notes (Signed)
Inpatient Diabetes Program Recommendations  AACE/ADA: New Consensus Statement on Inpatient Glycemic Control (2015)  Target Ranges:  Prepandial:   less than 140 mg/dL      Peak postprandial:   less than 180 mg/dL (1-2 hours)      Critically ill patients:  140 - 180 mg/dL   Lab Results  Component Value Date   GLUCAP 409 (H) 07/31/2017   HGBA1C 7.5 (H) 06/19/2016    Review of Glycemic ControlResults for [name] [name], [name] [name] (MRN [id number]) as of 07/31/2017 13:48  Ref. Range 07/31/2017 12:12 07/31/2017 12:59  Glucose-Capillary Latest Ref Range: 65 - 99 mg/dL 468 (H) 409 (H)   Diabetes history: Type 1 diabetes-See's Dr. Gabriel Carina Outpatient Diabetes medications: Lantus insulin 5 units qhs + Humalog insulin 3 units qAC + 1 unit per glucose of 50 mg/dl over a target of 200  Current orders for Inpatient glycemic control:  IV insulin-DKA order set  Inpatient Diabetes Program Recommendations:   Based on review of chart, patient is very sensitive to insulin doses.  May consider calling Dr. Gabriel Carina once acidosis is cleared for her to determine SQ insulin doses while in the hospital. Her total basal (Lantus) dose is only 5 units daily with Humalog 3 units with meals.  Will follow.  Thanks, Adah Perl, RN, BC-ADM Inpatient Diabetes Coordinator Pager [phone] (8a-5p)

## 2017-07-31 NOTE — Consult Note (Signed)
Name: [name] [name] MRN: [id number] DOB: 04-16-1941    ADMISSION DATE:  07/31/2017 CONSULTATION DATE: 07/31/2017  REFERRING MD : Dr. Margaretmary Eddy  CHIEF COMPLAINT: Emesis   BRIEF PATIENT DESCRIPTION:  76 yo female admitted 09/12 with DKA requiring insulin gtt   SIGNIFICANT EVENTS  09/12-Pt admitted to stepdown unit   STUDIES:  None  HISTORY OF PRESENT ILLNESS:   This is a 76 yo female with a PMH of Hypothyroidism, Osteoporosis, HTN, Hypercholesteremia, Type I DM-insulin pump dependant, Breast cancer s/p right mastectomy.  She presented to [facility] ER 09/12 with c/o nausea and vomiting onset of symptoms 09/11.  She also stated her blood sugars have been elevated since 09/11 and she feels dehydrated.  In the ER lab results revealed serum glucose 544, Na+ 122, CO2 17, creatinine 1.66, and wbc 26.6.  She was subsequently ruled in for DKA and an insulin gtt was initiated.  She was admitted to the stepdown unit by hospitalist for further workup and treatment PCCM consulted.   PAST MEDICAL HISTORY :   has a past medical history of Breast cancer ([place]) (2011); Cancer ([place]) (2011); diabetes insulin dep; High cholesterol; Hypertension; Hypothyroidism; Osteoporosis; and Thyroid disease.  has a past surgical history that includes Tonsillectomy; Total abdominal hysterectomy; Breast biopsy (Right, 2011); Mastectomy (Right, 2011); and Cataract extraction w/PHACO (Right, 06/13/2016). Prior to Admission medications   Medication Sig Start Date End Date Taking? Authorizing Provider  amLODipine (NORVASC) 5 MG tablet Take 1 tablet by mouth daily. 05/07/16   [provider]  brimonidine (ALPHAGAN) 0.2 % ophthalmic solution Place 1 drop into the right eye 3 (three) times daily. 06/21/16   Gouru, Illene Silver, MD  calcium-vitamin D (OSCAL-500) 500-400 MG-UNIT per tablet Take 1 tablet by mouth daily.     [provider]  denosumab (PROLIA) 60 MG/ML SOLN injection Inject 60 mg into the skin every 6 (six) months.  05/14/16   [provider]  dorzolamide-timolol (COSOPT) 22.3-6.8 MG/ML ophthalmic solution Place 1 drop into the right eye 2 (two) times daily. 06/21/16   Nicholes Mango, MD  DUREZOL 0.05 % EMUL  05/23/16   [provider]  enalapril (VASOTEC) 20 MG tablet Take 20 mg by mouth daily.    [provider]  feeding supplement, GLUCERNA SHAKE, (GLUCERNA SHAKE) LIQD Take 237 mLs by mouth 2 (two) times daily at 8 am and 10 pm. 06/21/16   Gouru, Illene Silver, MD  insulin glargine (LANTUS) 100 UNIT/ML injection Inject 0.05 mLs (5 Units total) into the skin at bedtime. 06/21/16   Gouru, Illene Silver, MD  insulin lispro (HUMALOG) 100 UNIT/ML injection Inject 0.03 mLs (3 Units total) into the skin 3 (three) times daily with meals. 06/21/16   Gouru, Illene Silver, MD  lovastatin (MEVACOR) 20 MG tablet Take 20 mg by mouth at bedtime.    [provider]  moxifloxacin (VIGAMOX) 0.5 % ophthalmic solution Place 1 drop into the right eye 4 (four) times daily. 06/21/16   Nicholes Mango, MD  prednisoLONE acetate (PRED FORTE) 1 % ophthalmic suspension Place 1 drop into the right eye 4 (four) times daily. 06/21/16   Nicholes Mango, MD  tamoxifen (NOLVADEX) 20 MG tablet TAKE 1 TABLET DAILY 04/20/17   Lequita Asal, MD   Allergies  Allergen Reactions  . Sulfa Antibiotics Other (See Comments)    possible possible  . Sulfasalazine   . Lisinopril Rash    Hyponatremia  Hyponatremia     FAMILY HISTORY:  family history includes Heart disease in her father.  SOCIAL HISTORY:  reports that she has never smoked. She has never used smokeless tobacco. She reports that she does not drink alcohol or use drugs.  REVIEW OF SYSTEMS: Positives in BOLD  Constitutional: Negative for fever, chills, weight loss, malaise/fatigue and diaphoresis.  HENT: Negative for hearing loss, ear pain, nosebleeds, congestion, sore throat, neck pain, tinnitus and ear discharge.   Eyes: Negative for blurred vision, double vision, photophobia, pain,  discharge and redness.  Respiratory: Negative for cough, hemoptysis, sputum production, shortness of breath, wheezing and stridor.   Cardiovascular: Negative for chest pain, palpitations, orthopnea, claudication, leg swelling and PND.  Gastrointestinal: heartburn, nausea, vomiting, abdominal pain, diarrhea, constipation, blood in stool and melena.  Genitourinary: Negative for dysuria, urgency, frequency, hematuria and flank pain.  Musculoskeletal: Negative for myalgias, back pain, joint pain and falls.  Skin: Negative for itching and rash.  Neurological: Negative for dizziness, tingling, tremors, sensory change, speech change, focal weakness, seizures, loss of consciousness, weakness and headaches.  Endo/Heme/Allergies: Negative for environmental allergies and polydipsia. Does not bruise/bleed easily.  SUBJECTIVE:  Pt states she feels a lot better.   VITAL SIGNS: Temp:  [98.3 F (36.8 C)-98.9 F (37.2 C)] 98.3 F (36.8 C) (09/12 1256) Pulse Rate:  [84-96] 90 (09/12 1300) Resp:  [13-19] 19 (09/12 1300) BP: (81-108)/(39-69) 81/69 (09/12 1300) SpO2:  [98 %-100 %] 99 % (09/12 1300) Weight:  [44.5 kg (98 lb)-46 kg (101 lb 6.6 oz)] 46 kg (101 lb 6.6 oz) (09/12 1256)  PHYSICAL EXAMINATION: General: well developed, well nourished Caucasian female, NAD  Neuro: alert and oriented, follows commands  HEENT: supple, no JVD Cardiovascular: s1s2, rrr, no M/R/G Lungs: clear throughout, even, non labored  Abdomen: +BS x4, soft, non tender, non distended  Musculoskeletal: normal bulk and tone, no edema  Skin: intact no rashes or lesions   Recent Labs Lab 07/31/17 1032  NA 122*  K 4.1  CL 87*  CO2 17*  BUN 42*  CREATININE 1.88*  GLUCOSE 544*    Recent Labs Lab 07/31/17 1032  HGB 11.6*  HCT 34.2*  WBC 26.6*  PLT 183   No results found.  ASSESSMENT / PLAN: Diabetic Ketoacidosis   Hyponatremia secondary to DKA  Nausea/Vomiting Acute renal failure  Hx: Type I DM insulin  dependant, Hypothyroidism, HTN, Hypercholesteremia  P: Continue insulin gtt until anion gap closed and serum CO2 >20 than will transition off insulin gtt BMP's q4hr and CBG's q1hr while on insulin gtt   NS @150  ml/hr once cbg <250 mg/dL will transition to D51/2NS @75  ml/hr  Follow hypoglycemic protocol  Diabetes coordinator consulted appreciate input  May have sips of water and ice chips while on insulin gtt  Prn zofran for nausea/vomiting Pharmacy consult for electrolyte management  Trend WBC and monitor fever curve Trend PCT  Follow urine culture  Trend BMP Monitor UOP Avoid nephrotoxic medications  Marda Stalker, [place] Pager [phone] (please enter 7 digits) [place] Pager [phone] (please enter 7 digits)   Merton Border, MD PCCM service Mobile [phone] Pager [phone] 08/01/2017 11:48 AM

## 2017-07-31 NOTE — Progress Notes (Signed)
Blood sugar 108. Insulin gtt at 1.4 units per hour. On D51/2 NS at 75/hr. Anion gap 7. K+ 3.3 just prior to dose of po Potassium.  Na+ 125.CO2 16.  Rosary Lively NP notified of above labs.

## 2017-07-31 NOTE — Care Management (Signed)
RNCM consult received and will follow for discharge planning needs. Patient is currently in ICU SDU for insulin drip.

## 2017-07-31 NOTE — ED Triage Notes (Signed)
Vomiting all day yesterday.  Has had some diarrhea, no fever, no abd pain

## 2017-08-01 ENCOUNTER — Encounter: Payer: Self-pay | Admitting: *Deleted

## 2017-08-01 LAB — CBC
HCT: 27.8 % — ABNORMAL LOW (ref 35.0–47.0)
Hemoglobin: 9.8 g/dL — ABNORMAL LOW (ref 12.0–16.0)
MCH: 33.8 pg (ref 26.0–34.0)
MCHC: 35.1 g/dL (ref 32.0–36.0)
MCV: 96.3 fL (ref 80.0–100.0)
PLATELETS: 181 10*3/uL (ref 150–440)
RBC: 2.89 MIL/uL — ABNORMAL LOW (ref 3.80–5.20)
RDW: 13.5 % (ref 11.5–14.5)
WBC: 14.5 10*3/uL — AB (ref 3.6–11.0)

## 2017-08-01 LAB — GLUCOSE, CAPILLARY
GLUCOSE-CAPILLARY: 152 mg/dL — AB (ref 65–99)
GLUCOSE-CAPILLARY: 168 mg/dL — AB (ref 65–99)
GLUCOSE-CAPILLARY: 186 mg/dL — AB (ref 65–99)
GLUCOSE-CAPILLARY: 57 mg/dL — AB (ref 65–99)
Glucose-Capillary: 81 mg/dL (ref 65–99)
Glucose-Capillary: 65 mg/dL (ref 65–99)
Glucose-Capillary: 239 mg/dL — ABNORMAL HIGH (ref 65–99)
Glucose-Capillary: 236 mg/dL — ABNORMAL HIGH (ref 65–99)
Glucose-Capillary: 242 mg/dL — ABNORMAL HIGH (ref 65–99)
Glucose-Capillary: 76 mg/dL (ref 65–99)

## 2017-08-01 LAB — COMPREHENSIVE METABOLIC PANEL
ALBUMIN: 2.5 g/dL — AB (ref 3.5–5.0)
ALT: 11 U/L — ABNORMAL LOW (ref 14–54)
ANION GAP: 5 (ref 5–15)
AST: 27 U/L (ref 15–41)
Alkaline Phosphatase: 23 U/L — ABNORMAL LOW (ref 38–126)
BILIRUBIN TOTAL: 0.4 mg/dL (ref 0.3–1.2)
BUN: 24 mg/dL — ABNORMAL HIGH (ref 6–20)
CHLORIDE: 103 mmol/L (ref 101–111)
CO2: 20 mmol/L — AB (ref 22–32)
Calcium: 7.3 mg/dL — ABNORMAL LOW (ref 8.9–10.3)
Creatinine, Ser: 1.01 mg/dL — ABNORMAL HIGH (ref 0.44–1.00)
GFR calc Af Amer: >60 mL/min (ref >60)
GFR calc non Af Amer: 52 mL/min — ABNORMAL LOW (ref >60)
GLUCOSE: 161 mg/dL — AB (ref 65–99)
POTASSIUM: 4 mmol/L (ref 3.5–5.1)
SODIUM: 128 mmol/L — AB (ref 135–145)
TOTAL PROTEIN: 4.2 g/dL — AB (ref 6.5–8.1)

## 2017-08-01 LAB — BASIC METABOLIC PANEL
Anion gap: 5 (ref 5–15)
BUN: 29 mg/dL — AB (ref 6–20)
CALCIUM: 7.3 mg/dL — AB (ref 8.9–10.3)
CO2: 21 mmol/L — ABNORMAL LOW (ref 22–32)
CREATININE: 1.04 mg/dL — AB (ref 0.44–1.00)
Chloride: 102 mmol/L (ref 101–111)
GFR calc Af Amer: 58 mL/min — ABNORMAL LOW (ref >60)
GFR, EST NON AFRICAN AMERICAN: 50 mL/min — AB (ref >60)
Glucose, Bld: 93 mg/dL (ref 65–99)
Potassium: 4 mmol/L (ref 3.5–5.1)
SODIUM: 128 mmol/L — AB (ref 135–145)

## 2017-08-01 LAB — HEMOGLOBIN A1C
Hgb A1c MFr Bld: 9 % — ABNORMAL HIGH (ref 4.8–5.6)
Mean Plasma Glucose: 211.6 mg/dL

## 2017-08-01 MED ORDER — INSULIN ASPART 100 UNIT/ML ~~LOC~~ SOLN
2.0000 [IU] | Freq: Three times a day (TID) | SUBCUTANEOUS | Status: DC
  Administered 2017-08-01 – 2017-08-02 (×4): 2 [IU] via SUBCUTANEOUS
  Filled 2017-08-01 (×4): qty 1

## 2017-08-01 MED ORDER — GLUCERNA SHAKE PO LIQD: 237.0000 mL | ORAL | Status: DC

## 2017-08-01 MED ORDER — DEXTROSE 50 % IV SOLN
1.0000 | Freq: Once | INTRAVENOUS | Status: AC
  Administered 2017-08-01: 50 mL via INTRAVENOUS
  Filled 2017-08-01: qty 50

## 2017-08-01 MED ORDER — PREMIER PROTEIN SHAKE
11.0000 [oz_av] | ORAL | Status: DC
  Administered 2017-08-02: 11 [oz_av] via ORAL

## 2017-08-01 MED ORDER — SODIUM CHLORIDE 0.9 % IV SOLN
INTRAVENOUS | Status: DC
  Administered 2017-08-01: 60 mL/h via INTRAVENOUS
  Administered 2017-08-02: 06:00:00 via INTRAVENOUS

## 2017-08-01 MED ORDER — INSULIN ASPART 100 UNIT/ML ~~LOC~~ SOLN: 0.0000 [IU] | Freq: Every day | SUBCUTANEOUS | Status: DC

## 2017-08-01 NOTE — Progress Notes (Signed)
Initial Nutrition Assessment  DOCUMENTATION CODES:   Non-severe (moderate) malnutrition in context of chronic illness, Underweight  INTERVENTION:  Provide Premier Protein po once daily, each supplement provides 160 kcal and 30 grams of protein. Patient can only have vanilla, she reports she cannot have chocolate.  Provide Glucerna Shake po once daily, each supplement provides 220 kcal and 10 grams of protein.  Encouraged adequate intake of calories and protein at meals. Discussed protein options patient will enjoy (beans/legumes, cheese and dairy products).   NUTRITION DIAGNOSIS:   Malnutrition (Moderate) related to chronic illness (advanced age) as evidenced by moderate depletion of body fat, moderate depletions of muscle mass, severe depletion of muscle mass.  GOAL:   Patient will meet greater than or equal to 90% of their needs  MONITOR:   PO intake, Supplement acceptance, Labs, Weight trends, I & O's  REASON FOR ASSESSMENT:   Other (Comment) (Low BMI)    ASSESSMENT:   76 year old female with PMHx of insulin-dependent DM, HTN, hypercholesterolemia, OP, hypothyroidism, hx of breast cancer s/p right mastectomy who presented with two day history of nausea and vomiting, admitted with DKA, psuedohyponatremia from DKA, and AKI.   Spoke with patient and her family members at bedside (husband and daughter). Patient reports she typically has a pretty good appetite. She eats two meals per day at home. For breakfast she may have cereal or eggs with toast or biscuit. For dinner if they eat at home they will make a beef bologna sandwich. If they go out to eat she will get a vegetable plate with string beans, turnip salad, and creamed potatoes. She does not eat meat because she does not like the taste of meat anymore. She has not tried any oral nutrition supplements before. She reports she cannot have anything chocolate as she gets ulcers in her mouth after consuming chocolate.  UBW around  100 lbs. Patient reports she is weight stable. Per chart patient was 103.8 lbs on 06/19/2016. Weight stable over the past year.   Meal Completion: 75% of breakfast this morning per chart  Medications reviewed and include: Novolog 0-9 units TID, Novolog 0-5 units QHS, Novolog 2 units TID, Lantus 5 units daily, NS @ 60 ml/hr.  Labs reviewed: CBG 65-242, Sodium 128, CO2 20, BUN 24, Creatinine 1.01.  Nutrition-Focused physical exam completed. Findings are moderate-severe fat depletion (moderate depletion of orbital region and thoracic/lumbar region; severe depletion of upper arm region), moderate-severe muscle depletion (moderate depletion of dorsal hand and posterior calf region; severe depletion of temple region, clavicle bone region, clavicle/acromion bone region, scapular bone region, patellar region, anterior thigh region), and no edema.   Patient discussed on rounds.  Diet Order:  Diet Carb Modified Fluid consistency: Thin; Room service appropriate? Yes  Skin:  Wound (see comment) (abrasion to right foot)  Last BM:  PTA (07/30/2017 per chart)  Height:   Ht Readings from Last 1 Encounters:  07/31/17 5\' 4"  (1.626 m)    Weight:   Wt Readings from Last 1 Encounters:  07/31/17 101 lb 6.6 oz (46 kg)    Ideal Body Weight:  54.5 kg  BMI:  Body mass index is 17.41 kg/m.  Estimated Nutritional Needs:   Kcal:  1380-1610 (30-35 kcal/kg)  Protein:  70-80 grams (1.5-1.7 grams/kg)  Fluid:  1.2 L/day (25 ml/kg)  EDUCATION NEEDS:   Education needs addressed  Willey Blade, MS, RD, LDN Pager: [phone] After Hours Pager: [phone]

## 2017-08-01 NOTE — Progress Notes (Signed)
Off insulin gtt PCT elevated but no definite infection identified Discussed with Dr Tressia Miners  Transfer to med-surg  After transfer, PCCM will sign off. Please call if we can be of further assistance    Merton Border, MD PCCM service Mobile [phone] Pager [phone] 08/01/2017 11:50 AM

## 2017-08-01 NOTE — Progress Notes (Signed)
Inpatient Diabetes Program Recommendations  AACE/ADA: New Consensus Statement on Inpatient Glycemic Control (2015)  Target Ranges:  Prepandial:   less than 140 mg/dL      Peak postprandial:   less than 180 mg/dL (1-2 hours)      Critically ill patients:  140 - 180 mg/dL   Results for [name] [name], [name] [name] (MRN [id number]) as of 08/01/2017 08:36  Ref. Range 07/31/2017 19:07 07/31/2017 20:19 07/31/2017 22:34 07/31/2017 23:00 08/01/2017 01:16 08/01/2017 02:58 08/01/2017 05:51 08/01/2017 07:09  Glucose-Capillary Latest Ref Range: 65 - 99 mg/dL 158 (H) 127 (H) 57 (L) 193 (H) 81 65 152 (H) 168 (H)   Review of Glycemic Control  Diabetes history: Type 1 diabetes-See's Dr. Gabriel Carina Outpatient Diabetes medications: Lantus insulin 5 units qhs + Humalog insulin 3 units qAC + 1 unit per glucose of 50 mg/dl over a target of 200 Current orders for Inpatient glycemic control: Lantus 5 units daily, Novolog 0-9 units TID with meals  Inpatient Diabetes Program Recommendations: Correction (SSI): Please consider ordering Novolog 0-5 units QHS for bedtime correction if needed. Insulin - Meal Coverage: Please consider ordering Novolog 2 units TID with meals for meal coverage if patient eats at least 50% of meals.  Thanks, Barnie Alderman, RN, MSN, CDE Diabetes Coordinator Inpatient Diabetes Program [phone] (Team Pager from 8am to 5pm)

## 2017-08-01 NOTE — Care Management Note (Signed)
Case Management Note  Patient Details  Name: [name] [name] MRN: [id number] Date of Birth: 06-10-1941  Subjective/Objective:                  Met with patient and her husband to discuss discharge planning. Both agree that she will not need home health services. She states that she is independent from home, cooks, cleans without equipment. She states she is able to drive however her husband does most of the driving.She has a glucometer and able to afford her diabetic medications however they cost ~180$/bottle.   Action/Plan: No current RNCM needs. PT evaluation requested.  Expected Discharge Date:  08/01/17               Expected Discharge Plan:     In-House Referral:     Discharge planning Services  CM Consult  Post Acute Care Choice:    Choice offered to:  Patient, Spouse  DME Arranged:    DME Agency:     HH Arranged:    [place] Agency:     Status of Service:  Completed, signed off  If discussed at [place] of Stay Meetings, dates discussed:    Additional Comments:  Marshell Garfinkel, RN 08/01/2017, 11:56 AM

## 2017-08-01 NOTE — Evaluation (Signed)
Physical Therapy Evaluation Patient Details Name: VLASTA BASKIN MRN: [id number] DOB: 1941-03-03 Today's Date: 08/01/2017   History of Present Illness  presented to ER secondary to persistent nausea/vomiting; admitted with DKA, blood glucose at 540 upon admission, initially requiring insulin drop for management.  Clinical Impression  Patient motivated for OOB, mobility assessment.  Higher level balance deficits evident, requiring cga/min assit from therapist for safety with all functional activities and gait (50').  Noted improvement with use of RW, patient subjectively reporting increased comfort/confidence with RW. Do recommend continued use of RW with all mobility at this time.  Patient/husband voiced understanding and agreement. Would benefit from skilled PT to address above deficits and promote optimal return to PLOF; Recommend transition to [place] upon discharge from acute hospitalization.     Follow Up Recommendations Home health PT    Equipment Recommendations   (youth RW)    Recommendations for Other Services       Precautions / Restrictions Precautions Precautions: Fall Restrictions Weight Bearing Restrictions: No      Mobility  Bed Mobility Overal bed mobility: Needs Assistance Bed Mobility: Supine to Sit     Supine to sit: Min assist        Transfers Overall transfer level: Needs assistance Equipment used: Rolling walker (2 wheeled) Transfers: Sit to/from Stand Sit to Stand: Min assist         General transfer comment: assist to prevent posterior LOB with initial transition to stance  Ambulation/Gait Ambulation/Gait assistance: Min assist Ambulation Distance (Feet): 50 Feet         General Gait Details: short, shuffling steps with decreased step height/length; guarded trunk posturing with limited arm swing.  Marked delay in balance strategies evident.  High risk for LOB, fall with external perturbation  Stairs            Wheelchair Mobility     Modified Rankin (Stroke Patients Only)       Balance Overall balance assessment: Needs assistance Sitting-balance support: No upper extremity supported;Feet supported Sitting balance-Leahy Scale: Good     Standing balance support: No upper extremity supported Standing balance-Leahy Scale: Poor                               Pertinent Vitals/Pain Pain Assessment: No/denies pain    Home Living Family/patient expects to be discharged to:: Private residence Living Arrangements: Spouse/significant other Available Help at Discharge: Family;Available 24 hours/day Type of Home: House Home Access: Stairs to enter Entrance Stairs-Rails: Left Entrance Stairs-Number of Steps: 2 Home Layout: One level Home Equipment: None      Prior Function Level of Independence: Independent         Comments: Mod indep with ADLs, household and limited community mobility; denies recent fall history.     Hand Dominance        Extremity/Trunk Assessment   Upper Extremity Assessment Upper Extremity Assessment: Generalized weakness (elevation to shoulder height only)    Lower Extremity Assessment Lower Extremity Assessment: Generalized weakness (grossly 4-/5 throughout)    Cervical / Trunk Assessment Cervical / Trunk Assessment:  (tends to maintain head/neck in R cervical rotation/lateral flexion.  Corrects with cuing, but reverts with fatigue)  Communication   Communication: No difficulties  Cognition Arousal/Alertness: Awake/alert Behavior During Therapy: WFL for tasks assessed/performed Overall Cognitive Status: Within Functional Limits for tasks assessed  General Comments: mild underlying confusion evident      General Comments      Exercises Other Exercises Other Exercises: 19' with RW, cga-improved step height/length, improved overall mechanics, cadence and gait speed.  Do recommend continued use of RW with all  mobility at this time; patient/husband voiced understanding   Assessment/Plan    PT Assessment Patient needs continued PT services  PT Problem List Decreased strength;Decreased range of motion;Decreased activity tolerance;Decreased balance;Decreased mobility;Decreased cognition;Decreased safety awareness;Decreased knowledge of use of DME;Decreased knowledge of precautions       PT Treatment Interventions DME instruction;Gait training;Stair training;Functional mobility training;Therapeutic activities;Therapeutic exercise;Balance training;Cognitive remediation;Patient/family education    PT Goals (Current goals can be found in the Care Plan section)  Acute Rehab PT Goals Patient Stated Goal: to go home PT Goal Formulation: With patient/family Time For Goal Achievement: 08/15/17 Potential to Achieve Goals: Good    Frequency Min 2X/week   Barriers to discharge        Co-evaluation               AM-PAC PT "6 Clicks" Daily Activity  Outcome Measure Difficulty turning over in bed (including adjusting bedclothes, sheets and blankets)?: Unable Difficulty moving from lying on back to sitting on the side of the bed? : Unable Difficulty sitting down on and standing up from a chair with arms (e.g., wheelchair, bedside commode, etc,.)?: Unable Help needed moving to and from a bed to chair (including a wheelchair)?: A Little Help needed walking in hospital room?: A Little Help needed climbing 3-5 steps with a railing? : A Little 6 Click Score: 12    End of Session Equipment Utilized During Treatment: Gait belt Activity Tolerance: Patient tolerated treatment well Patient left: in bed;with call bell/phone within reach;with family/visitor present Nurse Communication: Mobility status PT Visit Diagnosis: Muscle weakness (generalized) (M62.81);Difficulty in walking, not elsewhere classified (R26.2)    Time: 2956-2130 PT Time Calculation (min) (ACUTE ONLY): 29 min   Charges:   PT  Evaluation $PT Eval Moderate Complexity: 1 Mod PT Treatments $Gait Training: 8-22 mins   PT G Codes:   PT G-Codes **NOT FOR INPATIENT CLASS** Functional Assessment Tool Used: AM-PAC 6 Clicks Basic Mobility Functional Limitation: Mobility: Walking and moving around Mobility: Walking and Moving Around Current Status (Q6578): At least 20 percent but less than 40 percent impaired, limited or restricted Mobility: Walking and Moving Around Goal Status [phone]): At least 1 percent but less than 20 percent impaired, limited or restricted    Emily Massar H. Owens Shark, PT, DPT, NCS 08/01/17, 9:58 PM [phone]

## 2017-08-01 NOTE — Progress Notes (Signed)
Patient admitted for DKA which is currently resolved. Patient has also been transitioned to subq insulin w/ glargine. Patient's blood glucose now down to 90's with one reading of 57.  Spoke to NP to d/c insulin DKA drip. NP notified and agrees with plan.  Tobie Lords, PharmD, BCPS Clinical Pharmacist 08/01/2017

## 2017-08-02 LAB — BASIC METABOLIC PANEL
ANION GAP: 5 (ref 5–15)
BUN: 11 mg/dL (ref 6–20)
CALCIUM: 7.9 mg/dL — AB (ref 8.9–10.3)
CO2: 22 mmol/L (ref 22–32)
Chloride: 107 mmol/L (ref 101–111)
Creatinine, Ser: 0.7 mg/dL (ref 0.44–1.00)
Glucose, Bld: 155 mg/dL — ABNORMAL HIGH (ref 65–99)
Potassium: 4.1 mmol/L (ref 3.5–5.1)
SODIUM: 134 mmol/L — AB (ref 135–145)

## 2017-08-02 LAB — GLUCOSE, CAPILLARY
GLUCOSE-CAPILLARY: 128 mg/dL — AB (ref 65–99)
GLUCOSE-CAPILLARY: 220 mg/dL — AB (ref 65–99)
GLUCOSE-CAPILLARY: 260 mg/dL — AB (ref 65–99)

## 2017-08-02 LAB — URINE CULTURE: Special Requests: NORMAL

## 2017-08-02 LAB — PROCALCITONIN: PROCALCITONIN: 2.9 ng/mL

## 2017-08-02 MED ORDER — ENOXAPARIN SODIUM 40 MG/0.4ML ~~LOC~~ SOLN: 40.0000 mg | SUBCUTANEOUS | Status: DC

## 2017-08-02 MED ORDER — ENALAPRIL MALEATE 5 MG PO TABS: 5.0000 mg | ORAL_TABLET | Freq: Every day | ORAL | 2 refills | Status: DC

## 2017-08-02 NOTE — Care Management Important Message (Signed)
Important Message  Patient Details  Name: DEMIANA CRUMBLEY MRN: [id number] Date of Birth: 1941/04/16   Medicare Important Message Given:  N/A - LOS <3 / Initial given by admissions    Beverly Sessions, RN 08/02/2017, 10:11 AM

## 2017-08-02 NOTE — Progress Notes (Signed)
Pt okay to go home per MD even with urine culture suggesting recollection.

## 2017-08-02 NOTE — Care Management Note (Signed)
Case Management Note  Patient Details  Name: [name] [name] MRN: [id number] Date of Birth: [birth year]   Patient to discharge home today.  PT has assessed patient and recommends home health PT and RW.  Patient states that she received a RW on a previous admission, however does not use it at baseline.  Patient and husband provided home health agency preference.  They do not have a preference of agency.  Referral made PT to [facility] with [place].  Confirmed with Patient and MD that only PT services were indicated.  RNCM signing off.   Subjective/Objective:                    Action/Plan:   Expected Discharge Date:  08/02/17               Expected Discharge Plan:  [place]  In-House Referral:     Discharge planning Services  CM Consult  Post Acute Care Choice:  Home Health Choice offered to:  Patient, Spouse  DME Arranged:    DME Agency:     HH Arranged:    [place] Agency:  [place]  Status of Service:  Completed, signed off  If discussed at [place] of Stay Meetings, dates discussed:    Additional Comments:  Beverly Sessions, RN 08/02/2017, 12:09 PM

## 2017-08-02 NOTE — Progress Notes (Signed)
Inpatient Diabetes Program Recommendations  AACE/ADA: New Consensus Statement on Inpatient Glycemic Control (2015)  Target Ranges:  Prepandial:   less than 140 mg/dL      Peak postprandial:   less than 180 mg/dL (1-2 hours)      Critically ill patients:  140 - 180 mg/dL  Results for [name] [name], [name] [name] (MRN [id number]) as of 08/02/2017 07:52  Ref. Range 08/01/2017 07:09 08/01/2017 08:53 08/01/2017 11:03 08/01/2017 13:01 08/01/2017 16:40 08/01/2017 20:09 08/01/2017 21:42 08/02/2017 00:22  Glucose-Capillary Latest Ref Range: 65 - 99 mg/dL 168 (H) 239 (H)  Novolog 2 units 236 (H) 242 (H)  Novolog 5 units  Lantus 5 units 186 (H)  Novolog 4 units 57 (L) 76 128 (H)    Review of Glycemic Control  Diabetes history:Type 1 diabetes-DM managed by Dr. Gabriel Carina Outpatient Diabetes medications: Lantus insulin 5 units qhs + Humalog insulin 3 units qAC + 1 unit per glucose of 50 mg/dl over a target of 200 Current orders for Inpatient glycemic control: Lantus 5 units daily, Novolog 0-9 units TID with meals, Novolog 0-5 units QHS, Novolog 2 units TID with meals  Inpatient Diabetes Program Recommendations: Insulin - Meal Coverage: NURSING, please be sure patient eats at least 50% of meals before giving meal coverage insulin. Also, please be sure to document percentage of meal intake. Insulin-Correction: Please consider discontinuing Novolog 0-9 units TID with meals and order Novolog custom scale of Novolog 0-5 units TID with meals as follows: CBG 70-120 0 units  CBG 121-150 0 units CBG 151-200 1 unit CBG 201-250 2 units CBG 251-300 3 units CBG 301-350  4 units CBG 351-400 5 units  NOTE: In reviewing chart, noted patient received a total of Novolog 4 units (2 units for meal coverage and 2 units for correction) on 08/01/17 @ 16:47. No meal intake documented so not clear if patient ate supper or not. Following CBG at 20:09 was 57 mg/dl. Question if hypoglycemia a result of getting meal coverage and not  eating.  Thanks, Barnie Alderman, RN, MSN, CDE Diabetes Coordinator Inpatient Diabetes Program [phone] (Team Pager from 8am to 5pm)

## 2017-08-02 NOTE — Progress Notes (Signed)
[name] [name]  A and O x 4. VSS. Pt tolerating diet well. No complaints of pain or nausea. IV removed intact, prescriptions given. Pt voiced understanding of discharge instructions with no further questions. Pt discharged via wheelchair with Nurse Tech.    Allergies as of 08/02/2017      Reactions   Sulfa Antibiotics Other (See Comments)   possible possible   Sulfasalazine    Lisinopril Rash   Hyponatremia  Hyponatremia       Medication List    STOP taking these medications   amLODipine 5 MG tablet Commonly known as:  NORVASC   prednisoLONE acetate 1 % ophthalmic suspension Commonly known as:  PRED FORTE     TAKE these medications   brimonidine 0.2 % ophthalmic solution Commonly known as:  ALPHAGAN Place 1 drop into the right eye 3 (three) times daily.   denosumab 60 MG/ML Soln injection Commonly known as:  PROLIA Inject 60 mg into the skin every 6 (six) months.   dorzolamide-timolol 22.3-6.8 MG/ML ophthalmic solution Commonly known as:  COSOPT Place 1 drop into the right eye 2 (two) times daily.   DUREZOL 0.05 % Emul Generic drug:  Difluprednate   enalapril 5 MG tablet Commonly known as:  VASOTEC Take 1 tablet (5 mg total) by mouth daily. What changed:  medication strength  how much to take   feeding supplement (GLUCERNA SHAKE) Liqd Take 237 mLs by mouth 2 (two) times daily at 8 am and 10 pm.   insulin glargine 100 UNIT/ML injection Commonly known as:  LANTUS Inject 0.05 mLs (5 Units total) into the skin at bedtime.   insulin lispro 100 UNIT/ML injection Commonly known as:  HUMALOG Inject 0.03 mLs (3 Units total) into the skin 3 (three) times daily with meals.   lovastatin 20 MG tablet Commonly known as:  MEVACOR Take 20 mg by mouth at bedtime.   moxifloxacin 0.5 % ophthalmic solution Commonly known as:  VIGAMOX Place 1 drop into the right eye 4 (four) times daily.   tamoxifen 20 MG tablet Commonly known as:  NOLVADEX TAKE 1 TABLET DAILY             Discharge Care Instructions        Start     Ordered   08/02/17 0000  enalapril (VASOTEC) 5 MG tablet  Daily     08/02/17 0956   08/02/17 0000  Diet - low sodium heart healthy     08/02/17 0956   08/02/17 0000  Activity as tolerated - No restrictions     08/02/17 0956      Vitals:   08/01/17 2008 08/02/17 0450  BP: (!) 111/49 126/68  Pulse: 69 60  Resp: 18 14  Temp: 98.5 F (36.9 C) 98.4 F (36.9 C)  SpO2: 97% 97%    Francesco Sor

## 2017-08-02 NOTE — Progress Notes (Signed)
Pharmacist - Prescriber Communication  Enoxaparin dose modified from 30 mg subcutaneously once daily to 40 mg subcutaneously once daily due to improved SCr (1.01 mg/dL --> 0.7 mg/dL), CrCl approximately 40 to 45 mL/min, and ABW > 45 kg.   Tarvaris Puglia A. [place], [place].D., BCPS Clinical Pharmacist 08/02/17 09:00

## 2017-08-03 NOTE — Progress Notes (Signed)
[place] at [place] NAME: Keven Soucy    MR#:  [id number]  DATE OF BIRTH:  1941/02/25  SUBJECTIVE:  CHIEF COMPLAINT:   Chief Complaint  Patient presents with  . Emesis    REVIEW OF SYSTEMS:  Review of Systems  Constitutional: Positive for malaise/fatigue. Negative for chills and fever.  HENT: Negative for congestion, ear discharge, hearing loss and nosebleeds.   Eyes: Negative for blurred vision and double vision.  Respiratory: Negative for cough, shortness of breath and wheezing.   Cardiovascular: Negative for chest pain, palpitations and leg swelling.  Gastrointestinal: Positive for nausea. Negative for abdominal pain, constipation, diarrhea and vomiting.  Genitourinary: Negative for dysuria.  Musculoskeletal: Negative for myalgias.  Neurological: Negative for dizziness, sensory change, speech change, focal weakness, seizures and headaches.  Psychiatric/Behavioral: Negative for depression.    DRUG ALLERGIES:   Allergies  Allergen Reactions  . Sulfa Antibiotics Other (See Comments)    possible possible  . Sulfasalazine   . Lisinopril Rash    Hyponatremia  Hyponatremia     VITALS:  Blood pressure 126/68, pulse 60, temperature 98.4 F (36.9 C), temperature source Oral, resp. rate 14, height 5\' 4"  (1.626 m), weight 46 kg (101 lb 6.6 oz), SpO2 97 %.  PHYSICAL EXAMINATION:  Physical Exam  GENERAL:  76 y.o.-year-old elderly  patient lying in the bed with no acute distress.  EYES: Pupils equal, round, reactive to light and accommodation. No scleral icterus. Extraocular muscles intact.  HEENT: Head atraumatic, normocephalic. Oropharynx and nasopharynx clear.  NECK:  Supple, no jugular venous distention. No thyroid enlargement, no tenderness.  LUNGS: Normal breath sounds bilaterally, no wheezing, rales,rhonchi or crepitation. No use of accessory muscles of respiration. Decreased bibasilar breath sounds CARDIOVASCULAR: S1, S2  normal. No  rubs, or gallops. 2/6 systolic murmur present ABDOMEN: Soft, non-tender, non-distended. Bowel sounds present. No organomegaly or mass.  EXTREMITIES: No pedal edema, cyanosis, or clubbing.  NEUROLOGIC: Cranial nerves II through XII are intact. Muscle strength 5/5 in all extremities. Sensation intact. Gait not checked.  PSYCHIATRIC: The patient is alert and oriented x 3.  SKIN: No obvious rash, lesion, or ulcer.    LABORATORY PANEL:   CBC  Recent Labs Lab 08/01/17 0520  WBC 14.5*  HGB 9.8*  HCT 27.8*  PLT 181   ------------------------------------------------------------------------------------------------------------------  Chemistries   Recent Labs Lab 07/31/17 1710  08/01/17 0520 08/02/17 0452  NA 125*  < > 128* 134*  K 3.3*  < > 4.0 4.1  CL 97*  < > 103 107  CO2 21*  < > 20* 22  GLUCOSE 152*  < > 161* 155*  BUN 36*  < > 24* 11  CREATININE 1.36*  < > 1.01* 0.70  CALCIUM 7.9*  < > 7.3* 7.9*  MG 1.9  --   --   --   AST  --   --  27  --   ALT  --   --  11*  --   ALKPHOS  --   --  23*  --   BILITOT  --   --  0.4  --   < > = values in this interval not displayed. ------------------------------------------------------------------------------------------------------------------  Cardiac Enzymes No results for input(s): TROPONINI in the last 168 hours. ------------------------------------------------------------------------------------------------------------------  RADIOLOGY:  No results found.  EKG:   Orders placed or performed during the hospital encounter of 07/31/17  . ED EKG  . ED EKG  . EKG 12-Lead  .  EKG 12-Lead    ASSESSMENT AND PLAN:   76 year old female with type 1 diabetes mellitus, history of right-sided breast cancer, hypertension and hyperlipidemia presents to hospital with nausea vomiting and noted to have DKA.  #1 type 1 diabetes mellitus with DKA-usually well controlled, not sure if the dehydration might have triggered  her. -Had an anion gap of 18 with bicarbonate of 17 on presentation and glucose greater than 500. -She was started on insulin drip, admitted to stepdown. -Pro calcitonin was elevated at greater than 10-however no source of infection has been identified. -She received fluids, she is transitioned off the drip and is back on her home doses of Lantus and also pre-meal insulin. -Urine cultures are growing only mixed organisms.  #2 glaucoma-continue home eyedrops  #3 hyperlipidemia-on statin  #4hypertension-blood pressure has remained low in the hospital.  meds are on hold currently.  Monitor for 1 more day.    All the records are reviewed and case discussed with Care Management/Social Workerr. Management plans discussed with the patient, family and they are in agreement.  CODE STATUS: Full Code  TOTAL TIME TAKING CARE OF THIS PATIENT: 37 minutes.   POSSIBLE D/C IN 1-2 DAYS, DEPENDING ON CLINICAL CONDITION.   Gladstone Lighter M.D on 08/03/2017 at 11:50 AM  Between 7am to 6pm - Pager - [phone]  After 6pm go to [email or web address] - password [email or web address]  Office  [phone]  CC: Primary care physician; Tracie Harrier, MD

## 2017-08-03 NOTE — Discharge Summary (Signed)
[place] at [place] NAME: [name] [name]    MR#:  [id number]  DATE OF BIRTH:  1941/03/10  DATE OF ADMISSION:  07/31/2017   ADMITTING PHYSICIAN: Nicholes Mango, MD  DATE OF DISCHARGE: 08/02/2017  1:27 PM  PRIMARY CARE PHYSICIAN: Tracie Harrier, MD   ADMISSION DIAGNOSIS:   Vomiting and diarrhea [R11.10, R19.7] AKI (acute kidney injury) ([place]) [N17.9] Diabetic ketoacidosis without coma associated with type 1 diabetes mellitus ([place]) [E10.10]  DISCHARGE DIAGNOSIS:   Active Problems:   DKA (diabetic ketoacidoses) ([place])   SECONDARY DIAGNOSIS:   Past Medical History:  Diagnosis Date  . Breast cancer ([place]) 2011   RT MASTECTOMY  . Cancer ([place]) 2011   BREAST CA  . diabetes insulin dep   . High cholesterol   . Hypertension   . Hypothyroidism   . Osteoporosis   . Thyroid disease     HOSPITAL COURSE:   76 year old female with type 1 diabetes mellitus, history of right-sided breast cancer, hypertension and hyperlipidemia presents to hospital with nausea vomiting and noted to have DKA.  #1 type 1 diabetes mellitus with DKA-usually well controlled, not sure if the dehydration might have triggered her. -Had an anion gap of 18 with bicarbonate of 17 on presentation and glucose greater than 500. -She was started on insulin drip, admitted to stepdown. -Pro calcitonin was elevated at greater than 10-however no source of infection has been identified. -She received fluids, she was transitioned off the drip once the anion gap is closed. She is back on her home doses of Lantus and also pre-meal insulin. -Without any antibiotics, Pro calcitonin is come down to 2. -Urine cultures are growing only mixed organisms.  #2 glaucoma-continue home eyedrops  #3 hyperlipidemia-on statin  #4hypertension-blood pressure has remained low in the hospital. Enalapril dose has been reduced.  Patient has been ambulating well. Physical therapy recommended home  health. She'll be discharged home with home health.  DISCHARGE CONDITIONS:   Guarded  CONSULTS OBTAINED:   Treatment Team:  Wilhelmina Mcardle, MD  DRUG ALLERGIES:   Allergies  Allergen Reactions  . Sulfa Antibiotics Other (See Comments)    possible possible  . Sulfasalazine   . Lisinopril Rash    Hyponatremia  Hyponatremia    DISCHARGE MEDICATIONS:   Allergies as of 08/02/2017      Reactions   Sulfa Antibiotics Other (See Comments)   possible possible   Sulfasalazine    Lisinopril Rash   Hyponatremia  Hyponatremia       Medication List    STOP taking these medications   amLODipine 5 MG tablet Commonly known as:  NORVASC   prednisoLONE acetate 1 % ophthalmic suspension Commonly known as:  PRED FORTE     TAKE these medications   brimonidine 0.2 % ophthalmic solution Commonly known as:  ALPHAGAN Place 1 drop into the right eye 3 (three) times daily.   denosumab 60 MG/ML Soln injection Commonly known as:  PROLIA Inject 60 mg into the skin every 6 (six) months.   dorzolamide-timolol 22.3-6.8 MG/ML ophthalmic solution Commonly known as:  COSOPT Place 1 drop into the right eye 2 (two) times daily.   DUREZOL 0.05 % Emul Generic drug:  Difluprednate   enalapril 5 MG tablet Commonly known as:  VASOTEC Take 1 tablet (5 mg total) by mouth daily. What changed:  medication strength  how much to take   feeding supplement (GLUCERNA SHAKE) Liqd Take 237 mLs by mouth 2 (two) times  daily at 8 am and 10 pm.   insulin glargine 100 UNIT/ML injection Commonly known as:  LANTUS Inject 0.05 mLs (5 Units total) into the skin at bedtime.   insulin lispro 100 UNIT/ML injection Commonly known as:  HUMALOG Inject 0.03 mLs (3 Units total) into the skin 3 (three) times daily with meals.   lovastatin 20 MG tablet Commonly known as:  MEVACOR Take 20 mg by mouth at bedtime.   moxifloxacin 0.5 % ophthalmic solution Commonly known as:  VIGAMOX Place 1 drop into the  right eye 4 (four) times daily.   tamoxifen 20 MG tablet Commonly known as:  NOLVADEX TAKE 1 TABLET DAILY            Discharge Care Instructions        Start     Ordered   08/02/17 0000  enalapril (VASOTEC) 5 MG tablet  Daily     08/02/17 0956   08/02/17 0000  Diet - low sodium heart healthy     08/02/17 0956   08/02/17 0000  Activity as tolerated - No restrictions     08/02/17 0956       DISCHARGE INSTRUCTIONS:   1. PCP follow-up in 1-2 weeks 2. Endocrinology follow-up in 1 week  DIET:   Diabetic diet  ACTIVITY:   Activity as tolerated  OXYGEN:   Home Oxygen: No.  Oxygen Delivery: room air  DISCHARGE LOCATION:   home   If you experience worsening of your admission symptoms, develop shortness of breath, life threatening emergency, suicidal or homicidal thoughts you must seek medical attention immediately by calling 911 or calling your MD immediately  if symptoms less severe.  You Must read complete instructions/literature along with all the possible adverse reactions/side effects for all the Medicines you take and that have been prescribed to you. Take any new Medicines after you have completely understood and accpet all the possible adverse reactions/side effects.   Please note  You were cared for by a hospitalist during your hospital stay. If you have any questions about your discharge medications or the care you received while you were in the hospital after you are discharged, you can call the unit and asked to speak with the hospitalist on call if the hospitalist that took care of you is not available. Once you are discharged, your primary care physician will handle any further medical issues. Please note that NO REFILLS for any discharge medications will be authorized once you are discharged, as it is imperative that you return to your primary care physician (or establish a relationship with a primary care physician if you do not have one) for your aftercare  needs so that they can reassess your need for medications and monitor your lab values.    On the day of Discharge:  VITAL SIGNS:   Blood pressure 126/68, pulse 60, temperature 98.4 F (36.9 C), temperature source Oral, resp. rate 14, height 5\' 4"  (1.626 m), weight 46 kg (101 lb 6.6 oz), SpO2 97 %.  PHYSICAL EXAMINATION:    GENERAL:  76 y.o.-year-old elderly  patient lying in the bed with no acute distress.  EYES: Pupils equal, round, reactive to light and accommodation. No scleral icterus. Extraocular muscles intact.  HEENT: Head atraumatic, normocephalic. Oropharynx and nasopharynx clear.  NECK:  Supple, no jugular venous distention. No thyroid enlargement, no tenderness.  LUNGS: Normal breath sounds bilaterally, no wheezing, rales,rhonchi or crepitation. No use of accessory muscles of respiration. Decreased bibasilar breath sounds CARDIOVASCULAR: S1, S2 normal.  No  rubs, or gallops. 2/6 systolic murmur present ABDOMEN: Soft, non-tender, non-distended. Bowel sounds present. No organomegaly or mass.  EXTREMITIES: No pedal edema, cyanosis, or clubbing.  NEUROLOGIC: Cranial nerves II through XII are intact. Muscle strength 5/5 in all extremities. Sensation intact. Gait not checked.  PSYCHIATRIC: The patient is alert and oriented x 3.  SKIN: No obvious rash, lesion, or ulcer.   DATA REVIEW:   CBC  Recent Labs Lab 08/01/17 0520  WBC 14.5*  HGB 9.8*  HCT 27.8*  PLT 181    Chemistries   Recent Labs Lab 07/31/17 1710  08/01/17 0520 08/02/17 0452  NA 125*  < > 128* 134*  K 3.3*  < > 4.0 4.1  CL 97*  < > 103 107  CO2 21*  < > 20* 22  GLUCOSE 152*  < > 161* 155*  BUN 36*  < > 24* 11  CREATININE 1.36*  < > 1.01* 0.70  CALCIUM 7.9*  < > 7.3* 7.9*  MG 1.9  --   --   --   AST  --   --  27  --   ALT  --   --  11*  --   ALKPHOS  --   --  23*  --   BILITOT  --   --  0.4  --   < > = values in this interval not displayed.   Microbiology Results  Results for orders placed or  performed during the hospital encounter of 07/31/17  MRSA PCR Screening     Status: None   Collection Time: 07/31/17 12:57 PM  Result Value Ref Range Status   MRSA by PCR NEGATIVE NEGATIVE Final    Comment:        The GeneXpert MRSA Assay (FDA approved for NASAL specimens only), is one component of a comprehensive MRSA colonization surveillance program. It is not intended to diagnose MRSA infection nor to guide or monitor treatment for MRSA infections.   Urine Culture     Status: Abnormal   Collection Time: 07/31/17  4:45 PM  Result Value Ref Range Status   Specimen Description URINE, CLEAN CATCH  Final   Special Requests Normal  Final   Culture MULTIPLE SPECIES PRESENT, SUGGEST RECOLLECTION (A)  Final   Report Status 08/02/2017 FINAL  Final    RADIOLOGY:  No results found.   Management plans discussed with the patient, family and they are in agreement.  CODE STATUS:  Code Status History    Date Active Date Inactive Code Status Order ID Comments User Context   07/31/2017  1:16 PM 08/02/2017  4:27 PM Full Code [id number]  Nicholes Mango, MD Inpatient   06/18/2016  2:50 PM 06/21/2016  5:25 PM Full Code [id number]  Nicholes Mango, MD ED      TOTAL TIME TAKING CARE OF THIS PATIENT: 38 minutes.    Gladstone Lighter M.D on 08/03/2017 at 11:46 AM  Between 7am to 6pm - Pager - [phone]  After 6pm go to [email or web address] - Proofreader  [facility]  Office  [phone]  CC: Primary care physician; Tracie Harrier, MD   Note: This dictation was prepared with Dragon dictation along with smaller phrase technology. Any transcriptional errors that result from this process are unintentional.

## 2017-08-09 ENCOUNTER — Telehealth: Payer: Self-pay | Admitting: *Deleted

## 2017-08-09 ENCOUNTER — Inpatient Hospital Stay: Payer: Medicare Other | Attending: Hematology and Oncology | Admitting: Hematology and Oncology

## 2017-08-09 ENCOUNTER — Other Ambulatory Visit: Payer: Self-pay | Admitting: *Deleted

## 2017-08-09 ENCOUNTER — Encounter: Payer: Self-pay | Admitting: Hematology and Oncology

## 2017-08-09 VITALS — BP 135/73 | HR 96 | Temp 97.9°F | Resp 18 | Wt 99.4 lb

## 2017-08-09 DIAGNOSIS — E119 Type 2 diabetes mellitus without complications: Secondary | ICD-10-CM

## 2017-08-09 DIAGNOSIS — Z170 Estrogen receptor positive status [ER+]: Secondary | ICD-10-CM | POA: Diagnosis not present

## 2017-08-09 DIAGNOSIS — C50911 Malignant neoplasm of unspecified site of right female breast: Secondary | ICD-10-CM

## 2017-08-09 DIAGNOSIS — Z853 Personal history of malignant neoplasm of breast: Secondary | ICD-10-CM

## 2017-08-09 DIAGNOSIS — E7800 Pure hypercholesterolemia, unspecified: Secondary | ICD-10-CM

## 2017-08-09 DIAGNOSIS — Z79899 Other long term (current) drug therapy: Secondary | ICD-10-CM | POA: Insufficient documentation

## 2017-08-09 DIAGNOSIS — R634 Abnormal weight loss: Secondary | ICD-10-CM

## 2017-08-09 DIAGNOSIS — Z9011 Acquired absence of right breast and nipple: Secondary | ICD-10-CM

## 2017-08-09 DIAGNOSIS — I10 Essential (primary) hypertension: Secondary | ICD-10-CM

## 2017-08-09 DIAGNOSIS — E039 Hypothyroidism, unspecified: Secondary | ICD-10-CM | POA: Diagnosis not present

## 2017-08-09 DIAGNOSIS — M810 Age-related osteoporosis without current pathological fracture: Secondary | ICD-10-CM

## 2017-08-09 DIAGNOSIS — Z794 Long term (current) use of insulin: Secondary | ICD-10-CM | POA: Diagnosis not present

## 2017-08-09 NOTE — Progress Notes (Signed)
Patient here today for follow up regarding breast cancer.  Accompanied by her husband.  Patient would like MD to recommend something that she can use for occasional constipation.  Otherwise no complaints.

## 2017-08-09 NOTE — Telephone Encounter (Signed)
Called office and LVM to request dental clearance for patient to receive Prolia.    [facility] called back and is going to fax progress note from Dr. Vivianne Master.

## 2017-08-09 NOTE — Progress Notes (Signed)
[facility] day:  08/09/17  Chief Complaint: [name] [name] is an 76 y.o. female with stage IIA right breast cancer who is seen for reassessment.  HPI: The patient was last seen in the medical oncology clinic on 05/28/2016.  At that time, she was doing well.  Exam was unremarkable.  She had hyponatremia (130) and mild hypocalcemia (8.3).  Calcium supplement was increased.  She was to follow-up with Dr. Ginette Pitman.  She was scheduled to be seen in 10/2016.  Screening left mammogram on 04/24/2017 revealed no mammographic evidence of malignancy.  She was admitted to [facility] from 07/31/2017 - 08/02/2017 with diabetic ketoacidosis, vomiting, diarrhea, and acute kidney injury.  She had an anion gap of 18 with bicarbonate of 17 on presentation and glucose greater than 500.  She was started on insulin drip and admitted to stepdown.  Pro calcitonin was elevated at greater than 10, however no source of infection was identified.  She received fluids.  Creatine was 0.7 on 08/02/2017.  Symptomatically, she is doing well.  She denies physical complaints today. She denies fever, sweats, and increased bruising. Diet is good.  However, patient has lost 9 pounds since last visit. She has not been on her Prolia.  She saw her dentist on 08/08/2017.   Past Medical History:  Diagnosis Date  . Breast cancer ([place]) 2011   RT MASTECTOMY  . Cancer ([place]) 2011   BREAST CA  . diabetes insulin dep   . High cholesterol   . Hypertension   . Hypothyroidism   . Osteoporosis   . Thyroid disease     Past Surgical History:  Procedure Laterality Date  . BREAST BIOPSY Right 2011   positive  . CATARACT EXTRACTION W/PHACO Right 06/13/2016   Procedure: CATARACT EXTRACTION PHACO AND INTRAOCULAR LENS PLACEMENT (IOC);  Surgeon: Estill Cotta, MD;  Location: [facility] ORS;  Service: Ophthalmology;  Laterality: Right;  [place] 03:07 AP% 27.4 CDE 93.21 fluid pack lot # [id number]  . MASTECTOMY Right  2011   positive  . TONSILLECTOMY    . TOTAL ABDOMINAL HYSTERECTOMY      Family History  Problem Relation Age of Onset  . Heart disease Father        heart attack  . Breast cancer Neg Hx     Social History:  reports that she has never smoked. She has never used smokeless tobacco. She reports that she does not drink alcohol or use drugs.  The patient is accompanied by her husband, [name].  Allergies:  Allergies  Allergen Reactions  . Sulfa Antibiotics Other (See Comments)    possible possible  . Sulfasalazine   . Lisinopril Rash    Hyponatremia  Hyponatremia     Current Medications: Current Outpatient Prescriptions  Medication Sig Dispense Refill  . brimonidine (ALPHAGAN) 0.2 % ophthalmic solution Place 1 drop into the right eye 3 (three) times daily. 5 mL 1  . denosumab (PROLIA) 60 MG/ML SOLN injection Inject 60 mg into the skin every 6 (six) months.    . dorzolamide-timolol (COSOPT) 22.3-6.8 MG/ML ophthalmic solution Place 1 drop into the right eye 2 (two) times daily. 10 mL 1  . DUREZOL 0.05 % EMUL     . enalapril (VASOTEC) 5 MG tablet Take 1 tablet (5 mg total) by mouth daily. 30 tablet 2  . feeding supplement, GLUCERNA SHAKE, (GLUCERNA SHAKE) LIQD Take 237 mLs by mouth 2 (two) times daily at 8 am and 10 pm. 60 Can 0  .  insulin glargine (LANTUS) 100 UNIT/ML injection Inject 0.05 mLs (5 Units total) into the skin at bedtime. 10 mL 11  . insulin lispro (HUMALOG) 100 UNIT/ML injection Inject 0.03 mLs (3 Units total) into the skin 3 (three) times daily with meals. 10 mL 5  . lovastatin (MEVACOR) 20 MG tablet Take 20 mg by mouth at bedtime.    . moxifloxacin (VIGAMOX) 0.5 % ophthalmic solution Place 1 drop into the right eye 4 (four) times daily. 3 mL 1  . tamoxifen (NOLVADEX) 20 MG tablet TAKE 1 TABLET DAILY 90 tablet 1   No current facility-administered medications for this visit.     Review of Systems:  GENERAL:  Doing well.  No fevers or sweats.  Weight is down 9  pounds. PERFORMANCE STATUS (ECOG):  0 HEENT:  No visual changes, runny nose, sore throat, mouth sores or tenderness. Patient notes interval cataract issue "broke off and traveled behind eye" necessitating operation. Lungs: No shortness of breath or cough.  No hemoptysis. Cardiac:  No chest pain, palpitations, orthopnea, or PND. GI:  No nausea, vomiting, diarrhea, constipation, melena or hematochezia. GU:  No urgency, frequency, dysuria, or hematuria. Musculoskeletal:  No back pain.  No joint pain.  No muscle tenderness. Extremities:  No pain or swelling. Skin:  No rashes or skin changes.   Patient notes interval history of shingles. Neuro:  No headache, numbness or weakness, balance or coordination issues. Endocrine:  Diabetes.  Thyroid disease on Synthroid.  No hot flashes or night sweats. Psych:  No mood changes, depression or anxiety. Pain:  No focal pain. Review of systems:  All other systems reviewed and found to be negative.   Physical Exam: Blood pressure 135/73, pulse 96, temperature 97.9 F (36.6 C), temperature source Tympanic, resp. rate 18, weight 99 lb 7 oz (45.1 kg). GENERAL:  Thin elderly woman sitting comfortably in the exam room in no acute distress. MENTAL STATUS:  Alert and oriented to person, place and time. HEAD:  Pearline Cables short hair.  Normocephalic, atraumatic, face symmetric, no Cushingoid features. EYES:  Glasses.  Hazel eyes.  Pupils equal round and reactive to light and accomodation.  No conjunctivitis or scleral icterus. ENT:  Oropharynx clear without lesion.  Tongue normal. Mucous membranes moist.  RESPIRATORY:  Clear to auscultation without rales, wheezes or rhonchi. CARDIOVASCULAR:  Regular rate and rhythm without murmur, rub or gallop. BREAST:  Right sided mastectomy without erythema or nodularity.  Left breast with fibrocystic changes laterally (stable).  No discrete masses, skin changes or nipple discharge. ABDOMEN:  Soft, non-tender, with active bowel sounds,  and no hepatosplenomegaly.  No masses. SKIN:  No rashes, ulcers or lesions. EXTREMITIES: Arthritis changes in hands.  No edema, no skin discoloration or tenderness.  No palpable cords. LYMPH NODES: No palpable cervical, supraclavicular, axillary or inguinal adenopathy  NEUROLOGICAL: Unremarkable. PSYCH:  Appropriate.    No visits with results within 3 Day(s) from this visit.  Latest known visit with results is:  Admission on 07/31/2017, Discharged on 08/02/2017  Component Date Value Ref Range Status  . Lipase 07/31/2017 17  11 - 51 U/L Final  . Sodium 07/31/2017 122* 135 - 145 mmol/L Final  . Potassium 07/31/2017 4.1  3.5 - 5.1 mmol/L Final  . Chloride 07/31/2017 87* 101 - 111 mmol/L Final  . CO2 07/31/2017 17* 22 - 32 mmol/L Final  . Glucose, Bld 07/31/2017 544* 65 - 99 mg/dL Final   Comment: CRITICAL RESULT CALLED TO, READ BACK BY AND VERIFIED WITH COLLYN GILLISPIE  ON 07/31/17 AT 1105 JAG   . BUN 07/31/2017 42* 6 - 20 mg/dL Final  . Creatinine, Ser 07/31/2017 1.88* 0.44 - 1.00 mg/dL Final  . Calcium 07/31/2017 8.3* 8.9 - 10.3 mg/dL Final  . Total Protein 07/31/2017 5.9* 6.5 - 8.1 g/dL Final  . Albumin 07/31/2017 3.6  3.5 - 5.0 g/dL Final  . AST 07/31/2017 38  15 - 41 U/L Final  . ALT 07/31/2017 13* 14 - 54 U/L Final  . Alkaline Phosphatase 07/31/2017 31* 38 - 126 U/L Final  . Total Bilirubin 07/31/2017 1.4* 0.3 - 1.2 mg/dL Final  . GFR calc non Af Amer 07/31/2017 24* >60 mL/min Final  . GFR calc Af Amer 07/31/2017 28* >60 mL/min Final   Comment: (NOTE) The eGFR has been calculated using the CKD EPI equation. This calculation has not been validated in all clinical situations. eGFR's persistently <60 mL/min signify possible Chronic Kidney Disease.   . Anion gap 07/31/2017 18* 5 - 15 Final  . WBC 07/31/2017 26.6* 3.6 - 11.0 K/uL Final  . RBC 07/31/2017 3.39* 3.80 - 5.20 MIL/uL Final  . Hemoglobin 07/31/2017 11.6* 12.0 - 16.0 g/dL Final  . HCT 07/31/2017 34.2* 35.0 - 47.0 %  Final  . MCV 07/31/2017 100.8* 80.0 - 100.0 fL Final  . MCH 07/31/2017 34.1* 26.0 - 34.0 pg Final  . MCHC 07/31/2017 33.8  32.0 - 36.0 g/dL Final  . RDW 07/31/2017 13.1  11.5 - 14.5 % Final  . Platelets 07/31/2017 183  150 - 440 K/uL Final   COUNT MAY BE INACCURATE DUE TO FIBRIN CLUMPS.  . Color, Urine 07/31/2017 YELLOW  YELLOW Final  . APPearance 07/31/2017 CLEAR  CLEAR Final  . Specific Gravity, Urine 07/31/2017 1.015  1.005 - 1.030 Final  . pH 07/31/2017 5.5  5.0 - 8.0 Final  . Glucose, UA 07/31/2017 >1000* NEGATIVE mg/dL Final  . Hgb urine dipstick 07/31/2017 NEGATIVE  NEGATIVE Final  . Bilirubin Urine 07/31/2017 SMALL* NEGATIVE Final  . Ketones, ur 07/31/2017 40* NEGATIVE mg/dL Final  . Protein, ur 07/31/2017 NEGATIVE  NEGATIVE mg/dL Final  . Nitrite 07/31/2017 NEGATIVE  NEGATIVE Final  . Leukocytes, UA 07/31/2017 TRACE* NEGATIVE Final  . Squamous Epithelial / LPF 07/31/2017 NONE SEEN  NONE SEEN Final  . WBC, UA 07/31/2017 0-5  0 - 5 WBC/hpf Final  . RBC / HPF 07/31/2017 0-5  0 - 5 RBC/hpf Final  . Bacteria, UA 07/31/2017 NONE SEEN  NONE SEEN Final  . Specimen Description 07/31/2017 URINE, CLEAN CATCH   Final  . Special Requests 07/31/2017 Normal   Final  . Culture 07/31/2017 MULTIPLE SPECIES PRESENT, SUGGEST RECOLLECTION*  Final  . Report Status 07/31/2017 08/02/2017 FINAL   Final  . Glucose-Capillary 07/31/2017 468* 65 - 99 mg/dL Final  . MRSA by PCR 07/31/2017 NEGATIVE  NEGATIVE Final   Comment:        The GeneXpert MRSA Assay (FDA approved for NASAL specimens only), is one component of a comprehensive MRSA colonization surveillance program. It is not intended to diagnose MRSA infection nor to guide or monitor treatment for MRSA infections.   . Glucose-Capillary 07/31/2017 409* 65 - 99 mg/dL Final  . Sodium 07/31/2017 124* 135 - 145 mmol/L Final  . Potassium 07/31/2017 3.8  3.5 - 5.1 mmol/L Final  . Chloride 07/31/2017 91* 101 - 111 mmol/L Final  . CO2 07/31/2017  16* 22 - 32 mmol/L Final  . Glucose, Bld 07/31/2017 425* 65 - 99 mg/dL Final  . BUN 07/31/2017 39*  6 - 20 mg/dL Final  . Creatinine, Ser 07/31/2017 1.67* 0.44 - 1.00 mg/dL Final  . Calcium 07/31/2017 8.0* 8.9 - 10.3 mg/dL Final  . GFR calc non Af Amer 07/31/2017 28* >60 mL/min Final  . GFR calc Af Amer 07/31/2017 33* >60 mL/min Final   Comment: (NOTE) The eGFR has been calculated using the CKD EPI equation. This calculation has not been validated in all clinical situations. eGFR's persistently <60 mL/min signify possible Chronic Kidney Disease.   . Anion gap 07/31/2017 17* 5 - 15 Final  . Sodium 07/31/2017 125* 135 - 145 mmol/L Final  . Potassium 07/31/2017 3.3* 3.5 - 5.1 mmol/L Final  . Chloride 07/31/2017 97* 101 - 111 mmol/L Final  . CO2 07/31/2017 21* 22 - 32 mmol/L Final  . Glucose, Bld 07/31/2017 152* 65 - 99 mg/dL Final  . BUN 07/31/2017 36* 6 - 20 mg/dL Final  . Creatinine, Ser 07/31/2017 1.36* 0.44 - 1.00 mg/dL Final  . Calcium 07/31/2017 7.9* 8.9 - 10.3 mg/dL Final  . GFR calc non Af Amer 07/31/2017 36* >60 mL/min Final  . GFR calc Af Amer 07/31/2017 42* >60 mL/min Final   Comment: (NOTE) The eGFR has been calculated using the CKD EPI equation. This calculation has not been validated in all clinical situations. eGFR's persistently <60 mL/min signify possible Chronic Kidney Disease.   . Anion gap 07/31/2017 7  5 - 15 Final  . Sodium 07/31/2017 129* 135 - 145 mmol/L Final  . Potassium 07/31/2017 4.0  3.5 - 5.1 mmol/L Final  . Chloride 07/31/2017 102  101 - 111 mmol/L Final  . CO2 07/31/2017 21* 22 - 32 mmol/L Final  . Glucose, Bld 07/31/2017 87  65 - 99 mg/dL Final  . BUN 07/31/2017 31* 6 - 20 mg/dL Final  . Creatinine, Ser 07/31/2017 1.08* 0.44 - 1.00 mg/dL Final  . Calcium 07/31/2017 7.4* 8.9 - 10.3 mg/dL Final  . GFR calc non Af Amer 07/31/2017 48* >60 mL/min Final  . GFR calc Af Amer 07/31/2017 55* >60 mL/min Final   Comment: (NOTE) The eGFR has been calculated  using the CKD EPI equation. This calculation has not been validated in all clinical situations. eGFR's persistently <60 mL/min signify possible Chronic Kidney Disease.   . Anion gap 07/31/2017 6  5 - 15 Final  . Sodium 08/01/2017 128* 135 - 145 mmol/L Final  . Potassium 08/01/2017 4.0  3.5 - 5.1 mmol/L Final  . Chloride 08/01/2017 102  101 - 111 mmol/L Final  . CO2 08/01/2017 21* 22 - 32 mmol/L Final  . Glucose, Bld 08/01/2017 93  65 - 99 mg/dL Final  . BUN 08/01/2017 29* 6 - 20 mg/dL Final  . Creatinine, Ser 08/01/2017 1.04* 0.44 - 1.00 mg/dL Final  . Calcium 08/01/2017 7.3* 8.9 - 10.3 mg/dL Final  . GFR calc non Af Amer 08/01/2017 50* >60 mL/min Final  . GFR calc Af Amer 08/01/2017 58* >60 mL/min Final   Comment: (NOTE) The eGFR has been calculated using the CKD EPI equation. This calculation has not been validated in all clinical situations. eGFR's persistently <60 mL/min signify possible Chronic Kidney Disease.   . Anion gap 08/01/2017 5  5 - 15 Final  . Glucose-Capillary 07/31/2017 357* 65 - 99 mg/dL Final  . Procalcitonin 07/31/2017 10.68  ng/mL Final   Comment:        Interpretation: PCT >= 10 ng/mL: Important systemic inflammatory response, almost exclusively due to severe bacterial sepsis or septic shock. (NOTE)  ICU PCT Algorithm               Non ICU PCT Algorithm    ----------------------------     ------------------------------         PCT < 0.25 ng/mL                 PCT < 0.1 ng/mL     Stopping of antibiotics            Stopping of antibiotics       strongly encouraged.               strongly encouraged.    ----------------------------     ------------------------------       PCT level decrease by               PCT < 0.25 ng/mL       >= 80% from peak PCT       OR PCT 0.25 - 0.5 ng/mL          Stopping of antibiotics                                             encouraged.     Stopping of antibiotics           encouraged.    ----------------------------      ------------------------------       PCT level decrease by              PCT >= 0.25 ng/mL       < 80% from peak PCT        AND PCT >= 0.5 ng/mL                                      Continuing antibiotics                                              encouraged.       Continuing antibiotics            encouraged.    ----------------------------     ------------------------------     PCT level increase compared          PCT > 0.5 ng/mL         with peak PCT AND          PCT >= 0.5 ng/mL             Escalation of antibiotics                                          strongly encouraged.      Escalation of antibiotics        strongly encouraged.   . Glucose-Capillary 07/31/2017 317* 65 - 99 mg/dL Final  . Glucose-Capillary 07/31/2017 248* 65 - 99 mg/dL Final  . Magnesium 07/31/2017 1.9  1.7 - 2.4 mg/dL Final  . WBC 08/01/2017 14.5* 3.6 - 11.0 K/uL Final  . RBC 08/01/2017 2.89* 3.80 - 5.20 MIL/uL Final  . Hemoglobin 08/01/2017 9.8* 12.0 - 16.0 g/dL Final  .  HCT 08/01/2017 27.8* 35.0 - 47.0 % Final  . MCV 08/01/2017 96.3  80.0 - 100.0 fL Final  . MCH 08/01/2017 33.8  26.0 - 34.0 pg Final  . MCHC 08/01/2017 35.1  32.0 - 36.0 g/dL Final  . RDW 08/01/2017 13.5  11.5 - 14.5 % Final  . Platelets 08/01/2017 181  150 - 440 K/uL Final  . Sodium 08/01/2017 128* 135 - 145 mmol/L Final  . Potassium 08/01/2017 4.0  3.5 - 5.1 mmol/L Final  . Chloride 08/01/2017 103  101 - 111 mmol/L Final  . CO2 08/01/2017 20* 22 - 32 mmol/L Final  . Glucose, Bld 08/01/2017 161* 65 - 99 mg/dL Final  . BUN 08/01/2017 24* 6 - 20 mg/dL Final  . Creatinine, Ser 08/01/2017 1.01* 0.44 - 1.00 mg/dL Final  . Calcium 08/01/2017 7.3* 8.9 - 10.3 mg/dL Final  . Total Protein 08/01/2017 4.2* 6.5 - 8.1 g/dL Final  . Albumin 08/01/2017 2.5* 3.5 - 5.0 g/dL Final  . AST 08/01/2017 27  15 - 41 U/L Final  . ALT 08/01/2017 11* 14 - 54 U/L Final  . Alkaline Phosphatase 08/01/2017 23* 38 - 126 U/L Final  . Total Bilirubin  08/01/2017 0.4  0.3 - 1.2 mg/dL Final  . GFR calc non Af Amer 08/01/2017 52* >60 mL/min Final  . GFR calc Af Amer 08/01/2017 >60  >60 mL/min Final   Comment: (NOTE) The eGFR has been calculated using the CKD EPI equation. This calculation has not been validated in all clinical situations. eGFR's persistently <60 mL/min signify possible Chronic Kidney Disease.   . Anion gap 08/01/2017 5  5 - 15 Final  . Glucose-Capillary 07/31/2017 164* 65 - 99 mg/dL Final  . Glucose-Capillary 07/31/2017 108* 65 - 99 mg/dL Final  . Glucose-Capillary 07/31/2017 158* 65 - 99 mg/dL Final  . Glucose-Capillary 07/31/2017 127* 65 - 99 mg/dL Final  . Glucose-Capillary 07/31/2017 57* 65 - 99 mg/dL Final  . Glucose-Capillary 07/31/2017 193* 65 - 99 mg/dL Final  . Glucose-Capillary 08/01/2017 81  65 - 99 mg/dL Final  . Glucose-Capillary 08/01/2017 65  65 - 99 mg/dL Final  . Glucose-Capillary 08/01/2017 152* 65 - 99 mg/dL Final  . Glucose-Capillary 08/01/2017 168* 65 - 99 mg/dL Final  . Glucose-Capillary 08/01/2017 239* 65 - 99 mg/dL Final  . Glucose-Capillary 08/01/2017 242* 65 - 99 mg/dL Final  . Glucose-Capillary 08/01/2017 236* 65 - 99 mg/dL Final  . Glucose-Capillary 08/01/2017 186* 65 - 99 mg/dL Final  . Procalcitonin 08/02/2017 2.90  ng/mL Final   Comment:        Interpretation: PCT > 2 ng/mL: Systemic infection (sepsis) is likely, unless other causes are known. (NOTE)         ICU PCT Algorithm               Non ICU PCT Algorithm    ----------------------------     ------------------------------         PCT < 0.25 ng/mL                 PCT < 0.1 ng/mL     Stopping of antibiotics            Stopping of antibiotics       strongly encouraged.               strongly encouraged.    ----------------------------     ------------------------------       PCT level decrease by  PCT < 0.25 ng/mL       >= 80% from peak PCT       OR PCT 0.25 - 0.5 ng/mL          Stopping of antibiotics                                              encouraged.     Stopping of antibiotics           encouraged.    ----------------------------     ------------------------------       PCT level decrease by              PCT >= 0.25 ng/mL       < 80% from peak PCT        AND PCT >= 0.5 ng/mL            Continuing antibiotics                                                                        encouraged.       Continuing antibiotics            encouraged.    ----------------------------     ------------------------------     PCT level increase compared          PCT > 0.5 ng/mL         with peak PCT AND          PCT >= 0.5 ng/mL             Escalation of antibiotics                                          strongly encouraged.      Escalation of antibiotics        strongly encouraged.   . Sodium 08/02/2017 134* 135 - 145 mmol/L Final  . Potassium 08/02/2017 4.1  3.5 - 5.1 mmol/L Final  . Chloride 08/02/2017 107  101 - 111 mmol/L Final  . CO2 08/02/2017 22  22 - 32 mmol/L Final  . Glucose, Bld 08/02/2017 155* 65 - 99 mg/dL Final  . BUN 08/02/2017 11  6 - 20 mg/dL Final  . Creatinine, Ser 08/02/2017 0.70  0.44 - 1.00 mg/dL Final  . Calcium 08/02/2017 7.9* 8.9 - 10.3 mg/dL Final  . GFR calc non Af Amer 08/02/2017 >60  >60 mL/min Final  . GFR calc Af Amer 08/02/2017 >60  >60 mL/min Final   Comment: (NOTE) The eGFR has been calculated using the CKD EPI equation. This calculation has not been validated in all clinical situations. eGFR's persistently <60 mL/min signify possible Chronic Kidney Disease.   . Anion gap 08/02/2017 5  5 - 15 Final  . Hgb A1c MFr Bld 07/31/2017 9.0* 4.8 - 5.6 % Final   Comment: (NOTE) Pre diabetes:          5.7%-6.4% Diabetes:              >6.4% Glycemic  control for   <7.0% adults with diabetes   . Mean Plasma Glucose 07/31/2017 211.6  mg/dL Final   Performed at [facility] [address] [place], [place] [address]  . Glucose-Capillary 08/01/2017 57* 65 - 99  mg/dL Final  . Comment 1 08/01/2017 Notify RN   Final  . Glucose-Capillary 08/01/2017 76  65 - 99 mg/dL Final  . Glucose-Capillary 08/02/2017 128* 65 - 99 mg/dL Final  . Glucose-Capillary 08/02/2017 220* 65 - 99 mg/dL Final  . Comment 1 08/02/2017 Notify RN   Final  . Glucose-Capillary 08/02/2017 260* 65 - 99 mg/dL Final  . Comment 1 08/02/2017 Notify RN   Final    Assessment:  HEPHZIBAH STREHLE is an 76 y.o. female with a history of stage IIA right breast cancer s/p mastectomy on 06/01/2010. 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 Eight sentinel lymph nodes were negative for malignancy. Pathologic stage was T2N0M0.  Oncotype DX testing on 06/19/2010 revealed a recurrent score of 31. She did not receive chemotherapy or radiation. She began tamoxifen on 07/07/2010.    CA27.29 has been followed: 15.9 on 07/07/2010, 17.8 on 03/20/2011, 16.5 on 07/29/2012, 18.6 on 01/27/2013, 10.1 on 05/28/2014, 26.3 on 09/28/2014, 19.8 on 04/26/2015, 18 on 10/27/2015 and 16.3 on 05/03/2016.  Left-sided mammogram on 04/24/2017 revealed no evidence of malignancy.  Bone density study on 04/23/2016 revealed osteoporosis with a T-score of -4.5 in L1-L3, and -3.2 in the right femur.  She is on calcium and vitamin D. She was previously on alendronate. She began Prolia on 05/03/2016.  Symptomatically, she feels well.  Exam reveals fibrocystic changes in the LEFT breast.   Plan: 1.  Discuss interval mammogram- no evidence of malignancy. 2.  Discuss osteoporosis and need dental clearance for Prolia Helyn Numbers [phone]) 3.  Patient is not taking her Calcium and Vitamin D as prescribed. She was encouraged to restart Calcium 1200 mg and Vitamin D 800 units daily.  4.  RTC in 1 week for labs (CBC with diff, CMP, CA27.29) and Prolia. 5.  RTC in 6 months for labs (BMP) and Prolia. 6.  RTC in 1 year for MD assessment, labs (CBC with diff, CMP, CA27.29), and Prolia.    Honor Loh, NP   08/09/2017, 11:26 AM   I saw and evaluated the patient, participating in the key portions of the service and reviewing pertinent diagnostic studies and records.  I reviewed the nurse practitioner's note and agree with the findings and the plan.  The assessment and plan were discussed with the patient.  A few questions were asked by the patient and answered.   Lequita Asal, MD 08/09/2017,5:09 PM

## 2017-08-16 ENCOUNTER — Inpatient Hospital Stay: Payer: Medicare Other

## 2017-08-16 ENCOUNTER — Other Ambulatory Visit: Payer: Self-pay | Admitting: Hematology and Oncology

## 2017-08-16 DIAGNOSIS — M810 Age-related osteoporosis without current pathological fracture: Secondary | ICD-10-CM

## 2017-08-16 DIAGNOSIS — Z853 Personal history of malignant neoplasm of breast: Secondary | ICD-10-CM

## 2017-08-16 DIAGNOSIS — C50911 Malignant neoplasm of unspecified site of right female breast: Secondary | ICD-10-CM | POA: Diagnosis not present

## 2017-08-16 LAB — COMPREHENSIVE METABOLIC PANEL
ALT: 9 U/L — ABNORMAL LOW (ref 14–54)
AST: 25 U/L (ref 15–41)
Albumin: 3.9 g/dL (ref 3.5–5.0)
Alkaline Phosphatase: 33 U/L — ABNORMAL LOW (ref 38–126)
Anion gap: 9 (ref 5–15)
BUN: 8 mg/dL (ref 6–20)
CO2: 25 mmol/L (ref 22–32)
Calcium: 9 mg/dL (ref 8.9–10.3)
Chloride: 100 mmol/L — ABNORMAL LOW (ref 101–111)
Creatinine, Ser: 0.74 mg/dL (ref 0.44–1.00)
GFR calc Af Amer: >60 mL/min (ref >60)
GFR calc non Af Amer: >60 mL/min (ref >60)
Glucose, Bld: 120 mg/dL — ABNORMAL HIGH (ref 65–99)
Potassium: 3.9 mmol/L (ref 3.5–5.1)
Sodium: 134 mmol/L — ABNORMAL LOW (ref 135–145)
Total Bilirubin: 0.7 mg/dL (ref 0.3–1.2)
Total Protein: 6.5 g/dL (ref 6.5–8.1)

## 2017-08-16 LAB — CBC WITH DIFFERENTIAL/PLATELET
Basophils Absolute: 0.1 10*3/uL (ref 0–0.1)
Basophils Relative: 1 %
Eosinophils Absolute: 0.1 10*3/uL (ref 0–0.7)
Eosinophils Relative: 1 %
HCT: 36.4 % (ref 35.0–47.0)
Hemoglobin: 12.5 g/dL (ref 12.0–16.0)
Lymphocytes Relative: 29 %
Lymphs Abs: 1.9 10*3/uL (ref 1.0–3.6)
MCH: 33.9 pg (ref 26.0–34.0)
MCHC: 34.3 g/dL (ref 32.0–36.0)
MCV: 98.9 fL (ref 80.0–100.0)
Monocytes Absolute: 0.7 10*3/uL (ref 0.2–0.9)
Monocytes Relative: 10 %
Neutro Abs: 3.9 10*3/uL (ref 1.4–6.5)
Neutrophils Relative %: 59 %
Platelets: 282 10*3/uL (ref 150–440)
RBC: 3.68 MIL/uL — ABNORMAL LOW (ref 3.80–5.20)
RDW: 13.8 % (ref 11.5–14.5)
WBC: 6.6 10*3/uL (ref 3.6–11.0)

## 2017-08-16 MED ORDER — DENOSUMAB 60 MG/ML ~~LOC~~ SOLN
60.0000 mg | Freq: Once | SUBCUTANEOUS | Status: AC
  Administered 2017-08-16: 60 mg via SUBCUTANEOUS
  Filled 2017-08-16: qty 1

## 2017-08-16 NOTE — Progress Notes (Signed)
Confirmed with Rodena Piety B. That dental clearance was received.

## 2017-08-19 LAB — CA 27.29 (SERIAL MONITOR): CA 27.29: 17.5 U/mL (ref 0.0–38.6)

## 2017-10-17 ENCOUNTER — Other Ambulatory Visit: Payer: Self-pay | Admitting: Hematology and Oncology

## 2017-10-17 NOTE — Telephone Encounter (Signed)
  Please call patient.  Is she still taking tamoxifen?  She would have completed 5 years of treatment unless BCI testing performed.  M

## 2017-10-17 NOTE — Telephone Encounter (Addendum)
Yes, she is still taking it as of this morning, I do not see that she had BCI testing done. She had an Oncotype score of 31.  Per office note from Dr Kallie Edward, she was to complete Tamoxifen in 2016

## 2017-10-28 ENCOUNTER — Ambulatory Visit: Payer: Medicare Other | Admitting: Podiatry

## 2017-11-04 ENCOUNTER — Encounter: Payer: Self-pay | Admitting: Podiatry

## 2017-11-04 ENCOUNTER — Ambulatory Visit (INDEPENDENT_AMBULATORY_CARE_PROVIDER_SITE_OTHER): Payer: Medicare Other | Admitting: Podiatry

## 2017-11-04 DIAGNOSIS — E109 Type 1 diabetes mellitus without complications: Secondary | ICD-10-CM

## 2017-11-04 DIAGNOSIS — M79674 Pain in right toe(s): Secondary | ICD-10-CM

## 2017-11-04 DIAGNOSIS — B351 Tinea unguium: Secondary | ICD-10-CM

## 2017-11-04 DIAGNOSIS — L851 Acquired keratosis [keratoderma] palmaris et plantaris: Secondary | ICD-10-CM

## 2017-11-04 DIAGNOSIS — M79675 Pain in left toe(s): Secondary | ICD-10-CM

## 2017-11-04 NOTE — Progress Notes (Signed)
Complaint:  Visit Type: Patient returns to my office for continued preventative foot care services. Complaint: Patient states" my nails have grown long and thick and become painful to walk and wear shoes" Patient has been diagnosed with DM with no foot complications. The patient presents for preventative foot care services. No changes to ROS.  She has painful callus both feet.  Podiatric Exam: Vascular: dorsalis pedis and posterior tibial pulses are palpable bilateral. Capillary return is immediate. Temperature gradient is WNL. Skin turgor WNL  Sensorium: Normal Semmes Weinstein monofilament test. Normal tactile sensation bilaterally. Nail Exam: Pt has thick disfigured discolored nails with subungual debris noted bilateral entire nail hallux through fifth toenails Ulcer Exam: There is no evidence of ulcer or pre-ulcerative changes or infection. Orthopedic Exam: Muscle tone and strength are WNL. No limitations in general ROM. No crepitus or effusions noted. Foot type and digits show no abnormalities. Bony prominences are unremarkable. Skin:  Porokeratosis sub 2  B/L and sub 4,5 left foot.   No infection or ulcers  Diagnosis:  Onychomycosis, , Pain in right toe, pain in left toes.  Porokeratosis  B/L  Treatment & Plan Procedures and Treatment: Consent by patient was obtained for treatment procedures. The patient understood the discussion of treatment and procedures well. All questions were answered thoroughly reviewed. Debridement of mycotic and hypertrophic toenails, 1 through 5 bilateral and clearing of subungual debris. No ulceration, no infection noted. Debride porokeratosis  B/L Return Visit-Office Procedure: Patient instructed to return to the office for a follow up visit 3 months for continued evaluation and treatment.    Gardiner Barefoot DPM

## 2017-11-13 ENCOUNTER — Other Ambulatory Visit: Payer: Self-pay

## 2017-11-13 ENCOUNTER — Emergency Department: Payer: Medicare Other

## 2017-11-13 ENCOUNTER — Inpatient Hospital Stay
Admission: EM | Admit: 2017-11-13 | Discharge: 2017-11-19 | DRG: 637 | Disposition: A | Discharge status: Skilled Nursing Facility | Payer: Medicare Other | Attending: Internal Medicine | Admitting: Internal Medicine

## 2017-11-13 ENCOUNTER — Encounter: Payer: Self-pay | Admitting: *Deleted

## 2017-11-13 DIAGNOSIS — R059 Cough, unspecified: Secondary | ICD-10-CM

## 2017-11-13 DIAGNOSIS — M11231 Other chondrocalcinosis, right wrist: Secondary | ICD-10-CM | POA: Diagnosis present

## 2017-11-13 DIAGNOSIS — R4701 Aphasia: Secondary | ICD-10-CM | POA: Diagnosis present

## 2017-11-13 DIAGNOSIS — J189 Pneumonia, unspecified organism: Secondary | ICD-10-CM | POA: Diagnosis not present

## 2017-11-13 DIAGNOSIS — Z681 Body mass index (BMI) 19 or less, adult: Secondary | ICD-10-CM | POA: Diagnosis not present

## 2017-11-13 DIAGNOSIS — E871 Hypo-osmolality and hyponatremia: Secondary | ICD-10-CM | POA: Diagnosis present

## 2017-11-13 DIAGNOSIS — N179 Acute kidney failure, unspecified: Secondary | ICD-10-CM | POA: Diagnosis present

## 2017-11-13 DIAGNOSIS — E43 Unspecified severe protein-calorie malnutrition: Secondary | ICD-10-CM

## 2017-11-13 DIAGNOSIS — M11232 Other chondrocalcinosis, left wrist: Secondary | ICD-10-CM | POA: Diagnosis present

## 2017-11-13 DIAGNOSIS — E1110 Type 2 diabetes mellitus with ketoacidosis without coma: Secondary | ICD-10-CM | POA: Diagnosis not present

## 2017-11-13 DIAGNOSIS — Z79899 Other long term (current) drug therapy: Secondary | ICD-10-CM | POA: Diagnosis not present

## 2017-11-13 DIAGNOSIS — R402413 Glasgow coma scale score 13-15, at hospital admission: Secondary | ICD-10-CM | POA: Diagnosis present

## 2017-11-13 DIAGNOSIS — Z794 Long term (current) use of insulin: Secondary | ICD-10-CM | POA: Diagnosis not present

## 2017-11-13 DIAGNOSIS — Z882 Allergy status to sulfonamides status: Secondary | ICD-10-CM | POA: Diagnosis not present

## 2017-11-13 DIAGNOSIS — I10 Essential (primary) hypertension: Secondary | ICD-10-CM | POA: Diagnosis present

## 2017-11-13 DIAGNOSIS — E785 Hyperlipidemia, unspecified: Secondary | ICD-10-CM | POA: Diagnosis present

## 2017-11-13 DIAGNOSIS — F0390 Unspecified dementia without behavioral disturbance: Secondary | ICD-10-CM | POA: Diagnosis present

## 2017-11-13 DIAGNOSIS — E039 Hypothyroidism, unspecified: Secondary | ICD-10-CM | POA: Diagnosis present

## 2017-11-13 DIAGNOSIS — Z9011 Acquired absence of right breast and nipple: Secondary | ICD-10-CM | POA: Diagnosis not present

## 2017-11-13 DIAGNOSIS — M19031 Primary osteoarthritis, right wrist: Secondary | ICD-10-CM | POA: Diagnosis present

## 2017-11-13 DIAGNOSIS — C50919 Malignant neoplasm of unspecified site of unspecified female breast: Secondary | ICD-10-CM | POA: Diagnosis present

## 2017-11-13 DIAGNOSIS — Z853 Personal history of malignant neoplasm of breast: Secondary | ICD-10-CM | POA: Diagnosis not present

## 2017-11-13 DIAGNOSIS — Z888 Allergy status to other drugs, medicaments and biological substances status: Secondary | ICD-10-CM

## 2017-11-13 DIAGNOSIS — R609 Edema, unspecified: Secondary | ICD-10-CM

## 2017-11-13 DIAGNOSIS — M810 Age-related osteoporosis without current pathological fracture: Secondary | ICD-10-CM | POA: Diagnosis present

## 2017-11-13 DIAGNOSIS — R05 Cough: Secondary | ICD-10-CM

## 2017-11-13 DIAGNOSIS — M19032 Primary osteoarthritis, left wrist: Secondary | ICD-10-CM | POA: Diagnosis present

## 2017-11-13 LAB — URINALYSIS, COMPLETE (UACMP) WITH MICROSCOPIC
BACTERIA UA: NONE SEEN
BILIRUBIN URINE: NEGATIVE
Glucose, UA: >=500 mg/dL — AB
KETONES UR: 80 mg/dL — AB
LEUKOCYTES UA: NEGATIVE
NITRITE: NEGATIVE
PROTEIN: NEGATIVE mg/dL
Specific Gravity, Urine: 1.028 (ref 1.005–1.030)
pH: 5 (ref 5.0–8.0)

## 2017-11-13 LAB — CBC
HEMATOCRIT: 39.9 % (ref 35.0–47.0)
HEMOGLOBIN: 13.2 g/dL (ref 12.0–16.0)
MCH: 32.9 pg (ref 26.0–34.0)
MCHC: 33 g/dL (ref 32.0–36.0)
MCV: 99.5 fL (ref 80.0–100.0)
Platelets: 234 10*3/uL (ref 150–440)
RBC: 4.01 MIL/uL (ref 3.80–5.20)
RDW: 13.5 % (ref 11.5–14.5)
WBC: 15.6 10*3/uL — AB (ref 3.6–11.0)

## 2017-11-13 LAB — BASIC METABOLIC PANEL
ANION GAP: 18 — AB (ref 5–15)
BUN: 12 mg/dL (ref 6–20)
CHLORIDE: 92 mmol/L — AB (ref 101–111)
CO2: 14 mmol/L — ABNORMAL LOW (ref 22–32)
Calcium: 8.5 mg/dL — ABNORMAL LOW (ref 8.9–10.3)
Creatinine, Ser: 1.04 mg/dL — ABNORMAL HIGH (ref 0.44–1.00)
GFR calc Af Amer: 58 mL/min — ABNORMAL LOW (ref >60)
GFR, EST NON AFRICAN AMERICAN: 50 mL/min — AB (ref >60)
GLUCOSE: 486 mg/dL — AB (ref 65–99)
POTASSIUM: 5 mmol/L (ref 3.5–5.1)
Sodium: 124 mmol/L — ABNORMAL LOW (ref 135–145)

## 2017-11-13 LAB — GLUCOSE, CAPILLARY
GLUCOSE-CAPILLARY: 452 mg/dL — AB (ref 65–99)
GLUCOSE-CAPILLARY: 443 mg/dL — AB (ref 65–99)
Glucose-Capillary: 292 mg/dL — ABNORMAL HIGH (ref 65–99)
Glucose-Capillary: 374 mg/dL — ABNORMAL HIGH (ref 65–99)
Glucose-Capillary: 461 mg/dL — ABNORMAL HIGH (ref 65–99)

## 2017-11-13 LAB — INFLUENZA PANEL BY PCR (TYPE A & B)
Influenza A By PCR: NEGATIVE
Influenza B By PCR: NEGATIVE

## 2017-11-13 LAB — TROPONIN I: TROPONIN I: 0.1 ng/mL — AB (ref <0.03)

## 2017-11-13 MED ORDER — SODIUM CHLORIDE 0.9 % IV SOLN
INTRAVENOUS | Status: DC
  Administered 2017-11-13: 3.8 [IU]/h via INTRAVENOUS
  Filled 2017-11-13: qty 1

## 2017-11-13 MED ORDER — SODIUM CHLORIDE 0.9 % IV BOLUS (SEPSIS)
1000.0000 mL | Freq: Once | INTRAVENOUS | Status: AC
  Administered 2017-11-13: 1000 mL via INTRAVENOUS

## 2017-11-13 NOTE — ED Notes (Signed)
fsbs [phone] in triage.

## 2017-11-13 NOTE — ED Notes (Signed)
Patient denies pain and states doctors office sent her over for her CBG being over 400

## 2017-11-13 NOTE — ED Notes (Signed)
Patient going to xray

## 2017-11-13 NOTE — ED Provider Notes (Signed)
[facility] Emergency Department Provider Note    First MD Initiated Contact with Patient 11/13/17 2025     (approximate)  I have reviewed the triage vital signs and the nursing notes.   HISTORY  Chief Complaint Hyperglycemia  HPI [name] DUNKEL is a 76 y.o. female with below list of chronic medical conditions including diabetes mellitus presents to the emergency department as a referral from Dr. Ginette Pitman with hyperglycemia.  Patient's husband at bedside states that the patient has been progressively weak today with very poor p.o. intake and thirst.  Patient admits to congestion and cough however denies any fever.  Past Medical History:  Diagnosis Date  . Breast cancer ([place]) 2011   RT MASTECTOMY  . Cancer ([place]) 2011   BREAST CA  . diabetes insulin dep   . High cholesterol   . Hypertension   . Hypothyroidism   . Osteoporosis   . Thyroid disease     Patient Active Problem List   Diagnosis Date Noted  . AKI (acute kidney injury) ([place]) 11/13/2017  . DKA (diabetic ketoacidoses) ([place]) 07/31/2017  . Hyponatremia 06/18/2016  . Osteoporosis 04/23/2016  . Malignant neoplasm of breast ([place]) 03/30/2016  . Hyperlipemia 03/30/2016  . BP (high blood pressure) 03/30/2016  . Hypothyroidism, unspecified 03/30/2016  . Type 1 diabetes mellitus with hypoglycemia and without coma ([place]) 02/10/2016  . Type 1 diabetes mellitus without complication ([place]) 17/51/0258  . Dermatophytosis of nail 08/24/2013  . Pain in lower limb 08/24/2013    Past Surgical History:  Procedure Laterality Date  . BREAST BIOPSY Right 2011   positive  . CATARACT EXTRACTION W/PHACO Right 06/13/2016   Procedure: CATARACT EXTRACTION PHACO AND INTRAOCULAR LENS PLACEMENT (IOC);  Surgeon: Estill Cotta, MD;  Location: [facility] ORS;  Service: Ophthalmology;  Laterality: Right;  [place] 03:07 AP% 27.4 CDE 93.21 fluid pack lot # [id number]  . MASTECTOMY Right 2011   positive  . TONSILLECTOMY    . TOTAL  ABDOMINAL HYSTERECTOMY      Prior to Admission medications   Medication Sig Start Date End Date Taking? Authorizing Provider  DUREZOL 0.05 % EMUL Place 1 drop into the right eye every other day.  05/23/16  Yes [provider]  enalapril (VASOTEC) 5 MG tablet Take 1 tablet (5 mg total) by mouth daily. 08/02/17  Yes Gladstone Lighter, MD  feeding supplement, GLUCERNA SHAKE, (GLUCERNA SHAKE) LIQD Take 237 mLs by mouth 2 (two) times daily at 8 am and 10 pm. 06/21/16  Yes Gouru, Aruna, MD  insulin glargine (LANTUS) 100 UNIT/ML injection Inject 0.05 mLs (5 Units total) into the skin at bedtime. 06/21/16  Yes Gouru, Aruna, MD  insulin lispro (HUMALOG) 100 UNIT/ML injection Inject 0.03 mLs (3 Units total) into the skin 3 (three) times daily with meals. 06/21/16  Yes Gouru, Illene Silver, MD  lovastatin (MEVACOR) 20 MG tablet Take 20 mg by mouth at bedtime.   Yes [provider]  tamoxifen (NOLVADEX) 20 MG tablet TAKE 1 TABLET DAILY Patient taking differently: TAKE 1 TABLET AT BEDTIME 04/20/17  Yes Corcoran, Melissa C, MD  brimonidine (ALPHAGAN) 0.2 % ophthalmic solution Place 1 drop into the right eye 3 (three) times daily. Patient not taking: Reported on 11/13/2017 06/21/16   Nicholes Mango, MD  denosumab (PROLIA) 60 MG/ML SOLN injection Inject 60 mg into the skin every 6 (six) months. 05/14/16   [provider]  dorzolamide-timolol (COSOPT) 22.3-6.8 MG/ML ophthalmic solution Place 1 drop into the right eye 2 (two) times daily.  Patient not taking: Reported on 11/13/2017 06/21/16   Nicholes Mango, MD  moxifloxacin (VIGAMOX) 0.5 % ophthalmic solution Place 1 drop into the right eye 4 (four) times daily. Patient not taking: Reported on 11/13/2017 06/21/16   Nicholes Mango, MD    Allergies Lisinopril; Sulfa antibiotics; and Sulfasalazine  Family History  Problem Relation Age of Onset  . Heart disease Father        heart attack  . Breast cancer Neg Hx     Social History Social History   Tobacco  Use  . Smoking status: Never Smoker  . Smokeless tobacco: Never Used  Substance Use Topics  . Alcohol use: No  . Drug use: No    Review of Systems Constitutional: No fever/chills Eyes: No visual changes. ENT: No sore throat. Cardiovascular: Denies chest pain. Respiratory: Denies shortness of breath. Gastrointestinal: No abdominal pain.  No nausea, no vomiting.  No diarrhea.  No constipation. Genitourinary: Negative for dysuria. Musculoskeletal: Negative for neck pain.  Negative for back pain. Integumentary: Negative for rash. Neurological: Negative for headaches, focal weakness or numbness. Endocrine: Hyperglycemia and thirst yesterday  ____________________________________________   PHYSICAL EXAM:  VITAL SIGNS: ED Triage Vitals [11/13/17 1843]  Enc Vitals Group     BP (!) 140/53     Pulse Rate (!) 104     Resp 20     Temp 99 F (37.2 C)     Temp Source Oral     SpO2 98 %     Weight 45.4 kg (100 lb)     Height 1.626 m (5\' 4" )     Head Circumference      Peak Flow      Pain Score      Pain Loc      Pain Edu?      Excl. in [place]?     Constitutional: Alert and oriented. Well appearing and in no acute distress. Eyes: Conjunctivae are normal.  Head: Atraumatic. Mouth/Throat: Mucous membranes are dry.  Oropharynx non-erythematous. Neck: No stridor.   Cardiovascular: Normal rate, regular rhythm. Good peripheral circulation. Grossly normal heart sounds. Respiratory: Normal respiratory effort.  No retractions. Lungs CTAB. Gastrointestinal: Soft and nontender. No distention.  Musculoskeletal: No lower extremity tenderness nor edema. No gross deformities of extremities. Neurologic:  Normal speech and language. No gross focal neurologic deficits are appreciated.  Skin:  Skin is warm, dry and intact. No rash noted. Psychiatric: Mood and affect are normal. Speech and behavior are normal.  ____________________________________________   LABS (all labs ordered are listed, but  only abnormal results are displayed)  Labs Reviewed  BASIC METABOLIC PANEL - Abnormal; Notable for the following components:      Result Value   Sodium 124 (*)    Chloride 92 (*)    CO2 14 (*)    Glucose, Bld 486 (*)    Creatinine, Ser 1.04 (*)    Calcium 8.5 (*)    GFR calc non Af Amer 50 (*)    GFR calc Af Amer 58 (*)    Anion gap 18 (*)    All other components within normal limits  CBC - Abnormal; Notable for the following components:   WBC 15.6 (*)    All other components within normal limits  URINALYSIS, COMPLETE (UACMP) WITH MICROSCOPIC - Abnormal; Notable for the following components:   Color, Urine STRAW (*)    APPearance CLEAR (*)    Glucose, UA >=500 (*)    Hgb urine dipstick SMALL (*)  Ketones, ur 80 (*)    Squamous Epithelial / LPF 0-5 (*)    All other components within normal limits  TROPONIN I - Abnormal; Notable for the following components:   Troponin I 0.10 (*)    All other components within normal limits  GLUCOSE, CAPILLARY - Abnormal; Notable for the following components:   Glucose-Capillary 452 (*)    All other components within normal limits  GLUCOSE, CAPILLARY - Abnormal; Notable for the following components:   Glucose-Capillary 461 (*)    All other components within normal limits  GLUCOSE, CAPILLARY - Abnormal; Notable for the following components:   Glucose-Capillary 443 (*)    All other components within normal limits  GLUCOSE, CAPILLARY - Abnormal; Notable for the following components:   Glucose-Capillary 374 (*)    All other components within normal limits  GLUCOSE, CAPILLARY - Abnormal; Notable for the following components:   Glucose-Capillary 292 (*)    All other components within normal limits  INFLUENZA PANEL BY PCR (TYPE A & B)  CBG MONITORING, ED    RADIOLOGY I, [place] N Carle Fenech, personally viewed and evaluated these images (plain radiographs) as part of my medical decision making, as well as reviewing the written report by the  radiologist.  Dg Chest 2 View  Result Date: 11/13/2017 CLINICAL DATA:  Cough and dyspnea with high blood sugar EXAM: CHEST  2 VIEW COMPARISON:  06/08/2009 FINDINGS: Hyperinflation. No focal pulmonary infiltrate or effusion. Cardiomediastinal silhouette within normal limits with aortic atherosclerosis. No pneumothorax. Multiple old right-sided rib fractures. IMPRESSION: Hyperinflation without focal infiltrate or edema. Electronically Signed   By: Donavan Foil M.D.   On: 11/13/2017 21:06     Critical care: CRITICAL CARE Performed by: Gregor Hams   Total critical care time: 30 minutes  Critical care time was exclusive of separately billable procedures and treating other patients.  Critical care was necessary to treat or prevent imminent or life-threatening deterioration.  Critical care was time spent personally by me on the following activities: development of treatment plan with patient and/or surrogate as well as nursing, discussions with consultants, evaluation of patient's response to treatment, examination of patient, obtaining history from patient or surrogate, ordering and performing treatments and interventions, ordering and review of laboratory studies, ordering and review of radiographic studies, pulse oximetry and re-evaluation of patient's condition.  Procedures   ____________________________________________   INITIAL IMPRESSION / ASSESSMENT AND PLAN / ED COURSE  As part of my medical decision making, I reviewed the following data within the electronic MEDICAL RECORD [id number] year old female present with history and physical exam consistent with hyperglycemia with concern for possible DKA which was confirmed with laboratory data.  Patient received IV normal saline and insulin infusion.  Patient discussed with Dr. Jannifer Franklin for hospital admission for further evaluation and management of diabetic ketoacidosis. ____________________________________________  FINAL CLINICAL  IMPRESSION(S) / ED DIAGNOSES  Final diagnoses:  Diabetic ketoacidosis without coma associated with type 2 diabetes mellitus ([place])     MEDICATIONS GIVEN DURING THIS VISIT:  Medications  insulin regular (NOVOLIN R,HUMULIN R) 100 Units in sodium chloride 0.9 % 100 mL (1 Units/mL) infusion (4.6 Units/hr Intravenous Rate/Dose Change 11/13/17 2344)  sodium chloride 0.9 % bolus 1,000 mL (0 mLs Intravenous Stopped 11/13/17 2147)     ED Discharge Orders    None       Note:  This document was prepared using Dragon voice recognition software and may include unintentional dictation errors.    Gregor Hams, MD 11/14/17 [phone]

## 2017-11-13 NOTE — ED Notes (Signed)
Pt unable to void at this time. 

## 2017-11-13 NOTE — ED Notes (Signed)
Changed channel due to error on pump and nurse Sheri verified at rate of 3.8 on new channel

## 2017-11-13 NOTE — ED Triage Notes (Addendum)
Pt to triage via wheelchair.  Pt sent from dr hande's office with high blood sugar.  Pt alert.  No v/d   Skin warm and dry.

## 2017-11-13 NOTE — H&P (Signed)
[facility] [place] NAME: [name] [name]    MR#:  [id number]  DATE OF BIRTH:  1940-12-25  DATE OF ADMISSION:  11/13/2017  PRIMARY CARE PHYSICIAN: Tracie Harrier, MD   REQUESTING/REFERRING PHYSICIAN: Owens Shark, MD  CHIEF COMPLAINT:   Chief Complaint  Patient presents with  . Hyperglycemia    HISTORY OF PRESENT ILLNESS:  [name] [name]  is a 76 y.o. female who presents with malaise and progressive generalized weakness over the past month or so.  Over the last several days the patient has become significantly more intermittently lethargic.  Husband states that her blood sugars have been more difficult to control.  Here in the ED she was found to be in DKA.  Her husband also reports an episode a couple weeks ago where she had what sounds like some expressive aphasia or perhaps word salad events.  Hospitalist were called for admission  PAST MEDICAL HISTORY:   Past Medical History:  Diagnosis Date  . Breast cancer ([place]) 2011   RT MASTECTOMY  . Cancer ([place]) 2011   BREAST CA  . diabetes insulin dep   . High cholesterol   . Hypertension   . Hypothyroidism   . Osteoporosis   . Thyroid disease     PAST SURGICAL HISTORY:   Past Surgical History:  Procedure Laterality Date  . BREAST BIOPSY Right 2011   positive  . CATARACT EXTRACTION W/PHACO Right 06/13/2016   Procedure: CATARACT EXTRACTION PHACO AND INTRAOCULAR LENS PLACEMENT (IOC);  Surgeon: Estill Cotta, MD;  Location: [facility] ORS;  Service: Ophthalmology;  Laterality: Right;  [place] 03:07 AP% 27.4 CDE 93.21 fluid pack lot # [id number]  . MASTECTOMY Right 2011   positive  . TONSILLECTOMY    . TOTAL ABDOMINAL HYSTERECTOMY      SOCIAL HISTORY:   Social History   Tobacco Use  . Smoking status: Never Smoker  . Smokeless tobacco: Never Used  Substance Use Topics  . Alcohol use: No    FAMILY HISTORY:   Family History  Problem Relation Age of Onset  . Heart disease Father         heart attack  . Breast cancer Neg Hx     DRUG ALLERGIES:   Allergies  Allergen Reactions  . Lisinopril Rash and Other (See Comments)    Hyponatremia  Hyponatremia  Hyponatremia   . Sulfa Antibiotics Other (See Comments)    possible possible Patient doesn't know   . Sulfasalazine     MEDICATIONS AT HOME:   Prior to Admission medications   Medication Sig Start Date End Date Taking? Authorizing Provider  DUREZOL 0.05 % EMUL Place 1 drop into the right eye every other day.  05/23/16  Yes [provider]  enalapril (VASOTEC) 5 MG tablet Take 1 tablet (5 mg total) by mouth daily. 08/02/17  Yes Gladstone Lighter, MD  feeding supplement, GLUCERNA SHAKE, (GLUCERNA SHAKE) LIQD Take 237 mLs by mouth 2 (two) times daily at 8 am and 10 pm. 06/21/16  Yes Gouru, Aruna, MD  insulin glargine (LANTUS) 100 UNIT/ML injection Inject 0.05 mLs (5 Units total) into the skin at bedtime. 06/21/16  Yes Gouru, Aruna, MD  insulin lispro (HUMALOG) 100 UNIT/ML injection Inject 0.03 mLs (3 Units total) into the skin 3 (three) times daily with meals. 06/21/16  Yes Gouru, Illene Silver, MD  lovastatin (MEVACOR) 20 MG tablet Take 20 mg by mouth at bedtime.   Yes [provider]  tamoxifen (NOLVADEX) 20 MG tablet TAKE  1 TABLET DAILY Patient taking differently: TAKE 1 TABLET AT BEDTIME 04/20/17  Yes Corcoran, Melissa C, MD  brimonidine (ALPHAGAN) 0.2 % ophthalmic solution Place 1 drop into the right eye 3 (three) times daily. Patient not taking: Reported on 11/13/2017 06/21/16   Nicholes Mango, MD  denosumab (PROLIA) 60 MG/ML SOLN injection Inject 60 mg into the skin every 6 (six) months. 05/14/16   [provider]  dorzolamide-timolol (COSOPT) 22.3-6.8 MG/ML ophthalmic solution Place 1 drop into the right eye 2 (two) times daily. Patient not taking: Reported on 11/13/2017 06/21/16   Nicholes Mango, MD  moxifloxacin (VIGAMOX) 0.5 % ophthalmic solution Place 1 drop into the right eye 4 (four) times daily. Patient  not taking: Reported on 11/13/2017 06/21/16   Nicholes Mango, MD    REVIEW OF SYSTEMS:  Review of Systems  Constitutional: Positive for malaise/fatigue. Negative for chills, fever and weight loss.  HENT: Negative for ear pain, hearing loss and tinnitus.   Eyes: Negative for blurred vision, double vision, pain and redness.  Respiratory: Negative for cough, hemoptysis and shortness of breath.   Cardiovascular: Negative for chest pain, palpitations, orthopnea and leg swelling.  Gastrointestinal: Negative for abdominal pain, constipation, diarrhea, nausea and vomiting.  Genitourinary: Negative for dysuria, frequency and hematuria.  Musculoskeletal: Negative for back pain, joint pain and neck pain.  Skin:       No acne, rash, or lesions  Neurological: Positive for weakness. Negative for dizziness, tremors and focal weakness.  Endo/Heme/Allergies: Negative for polydipsia. Does not bruise/bleed easily.  Psychiatric/Behavioral: Negative for depression. The patient is not nervous/anxious and does not have insomnia.      VITAL SIGNS:   Vitals:   11/13/17 1843 11/13/17 1945 11/13/17 2030 11/13/17 2130  BP: (!) 140/53 (!) 156/63 (!) 159/71 (!) 150/66  Pulse: (!) 104 97 (!) 105 (!) 191  Resp: 20 14 19 15   Temp: 99 F (37.2 C)     TempSrc: Oral     SpO2: 98% 98% 95% (!) 88%  Weight: 45.4 kg (100 lb)     Height: 5\' 4"  (1.626 m)      Wt Readings from Last 3 Encounters:  11/13/17 45.4 kg (100 lb)  08/09/17 45.1 kg (99 lb 7 oz)  07/31/17 46 kg (101 lb 6.6 oz)    PHYSICAL EXAMINATION:  Physical Exam  Vitals reviewed. Constitutional: She is oriented to person, place, and time. She appears well-developed and well-nourished. No distress.  HENT:  Head: Normocephalic and atraumatic.  Mouth/Throat: Oropharynx is clear and moist.  Eyes: Conjunctivae and EOM are normal. Pupils are equal, round, and reactive to light. No scleral icterus.  Neck: Normal range of motion. Neck supple. No JVD present. No  thyromegaly present.  Cardiovascular: Normal rate, regular rhythm and intact distal pulses. Exam reveals no gallop and no friction rub.  No murmur heard. Respiratory: Effort normal and breath sounds normal. No respiratory distress. She has no wheezes. She has no rales.  GI: Soft. Bowel sounds are normal. She exhibits no distension. There is no tenderness.  Musculoskeletal: Normal range of motion. She exhibits no edema.  No arthritis, no gout  Lymphadenopathy:    She has no cervical adenopathy.  Neurological: She is alert and oriented to person, place, and time. No cranial nerve deficit.  No dysarthria, no aphasia  Skin: Skin is warm and dry. No rash noted. No erythema.  Psychiatric: She has a normal mood and affect. Her behavior is normal. Judgment and thought content normal.  LABORATORY PANEL:   CBC Recent Labs  Lab 11/13/17 1845  WBC 15.6*  HGB 13.2  HCT 39.9  PLT 234   ------------------------------------------------------------------------------------------------------------------  Chemistries  Recent Labs  Lab 11/13/17 1845  NA 124*  K 5.0  CL 92*  CO2 14*  GLUCOSE 486*  BUN 12  CREATININE 1.04*  CALCIUM 8.5*   ------------------------------------------------------------------------------------------------------------------  Cardiac Enzymes Recent Labs  Lab 11/13/17 1845  TROPONINI 0.10*   ------------------------------------------------------------------------------------------------------------------  RADIOLOGY:  Dg Chest 2 View  Result Date: 11/13/2017 CLINICAL DATA:  Cough and dyspnea with high blood sugar EXAM: CHEST  2 VIEW COMPARISON:  06/08/2009 FINDINGS: Hyperinflation. No focal pulmonary infiltrate or effusion. Cardiomediastinal silhouette within normal limits with aortic atherosclerosis. No pneumothorax. Multiple old right-sided rib fractures. IMPRESSION: Hyperinflation without focal infiltrate or edema. Electronically Signed   By: Donavan Foil M.D.   On: 11/13/2017 21:06    EKG:   Orders placed or performed during the hospital encounter of 07/31/17  . ED EKG  . ED EKG  . EKG 12-Lead  . EKG 12-Lead    IMPRESSION AND PLAN:  Principal Problem:   DKA (diabetic ketoacidoses) ([place]) -admit with DKA admission order set with IV insulin Active Problems:   Expressive aphasia -episode occurred a couple weeks ago, will get an MRI to further investigate   AKI (acute kidney injury) ([place]) -IV fluids per order set, avoid nephrotoxins and monitor for improvement   Malignant neoplasm of breast ([place]) -continue tamoxifen   BP (high blood pressure) -continue home meds   Hyperlipemia -continue home meds  All the records are reviewed and case discussed with ED provider. Management plans discussed with the patient and/or family.  DVT PROPHYLAXIS: SubQ lovenox  GI PROPHYLAXIS: PPI  ADMISSION STATUS: Inpatient  CODE STATUS: Full Code Status History    Date Active Date Inactive Code Status Order ID Comments User Context   07/31/2017 13:16 08/02/2017 16:27 Full Code [id number]  Nicholes Mango, MD Inpatient   06/18/2016 14:50 06/21/2016 17:25 Full Code [id number]  Nicholes Mango, MD ED    Advance Directive Documentation     Most Recent Value  Type of Advance Directive  Living will  Pre-existing out of facility DNR order (yellow form or pink MOST form)  No data  "MOST" Form in Place?  No data      TOTAL TIME TAKING CARE OF THIS PATIENT: 45 minutes.   Jannifer Franklin, [place] Lafontaine [place] 11/13/2017, 10:22 PM  [facility]  Office  [phone]  CC: Primary care physician; Tracie Harrier, MD  Note:  This document was prepared using Dragon voice recognition software and may include unintentional dictation errors.

## 2017-11-13 NOTE — ED Notes (Signed)
Patient asked to use bedpan and then wasn't able to go then a few minutes later husband said she needed to go now and when nurse came in. Patient stated she had already gone. Patient cleaned up and put nee chucks under her.

## 2017-11-13 NOTE — ED Notes (Signed)
Charge nurse notified of pt's troponin level; pt taken to room 17 via w/c and placed in hosp gown & on card monitor for further evaluation; report given to Wells Guiles, [place]

## 2017-11-13 NOTE — ED Notes (Signed)
CBG [place] verified with this nurse in [employer]

## 2017-11-14 ENCOUNTER — Inpatient Hospital Stay: Payer: Medicare Other

## 2017-11-14 DIAGNOSIS — E43 Unspecified severe protein-calorie malnutrition: Secondary | ICD-10-CM

## 2017-11-14 LAB — BASIC METABOLIC PANEL
ANION GAP: 9 (ref 5–15)
ANION GAP: 7 (ref 5–15)
BUN: 10 mg/dL (ref 6–20)
BUN: 9 mg/dL (ref 6–20)
CO2: 17 mmol/L — ABNORMAL LOW (ref 22–32)
CO2: 18 mmol/L — ABNORMAL LOW (ref 22–32)
Calcium: 7.7 mg/dL — ABNORMAL LOW (ref 8.9–10.3)
Calcium: 7.8 mg/dL — ABNORMAL LOW (ref 8.9–10.3)
Chloride: 103 mmol/L (ref 101–111)
Chloride: 104 mmol/L (ref 101–111)
Creatinine, Ser: 0.81 mg/dL (ref 0.44–1.00)
Creatinine, Ser: 0.85 mg/dL (ref 0.44–1.00)
GFR calc Af Amer: >60 mL/min (ref >60)
GFR calc Af Amer: >60 mL/min (ref >60)
Glucose, Bld: 210 mg/dL — ABNORMAL HIGH (ref 65–99)
Glucose, Bld: 184 mg/dL — ABNORMAL HIGH (ref 65–99)
POTASSIUM: 3.9 mmol/L (ref 3.5–5.1)
POTASSIUM: 4 mmol/L (ref 3.5–5.1)
SODIUM: 129 mmol/L — AB (ref 135–145)
Sodium: 129 mmol/L — ABNORMAL LOW (ref 135–145)

## 2017-11-14 LAB — CBC
HEMATOCRIT: 33.6 % — AB (ref 35.0–47.0)
HEMOGLOBIN: 11.4 g/dL — AB (ref 12.0–16.0)
MCH: 32.7 pg (ref 26.0–34.0)
MCHC: 33.8 g/dL (ref 32.0–36.0)
MCV: 96.7 fL (ref 80.0–100.0)
Platelets: 224 10*3/uL (ref 150–440)
RBC: 3.48 MIL/uL — ABNORMAL LOW (ref 3.80–5.20)
RDW: 13.3 % (ref 11.5–14.5)
WBC: 13.6 10*3/uL — AB (ref 3.6–11.0)

## 2017-11-14 LAB — GLUCOSE, CAPILLARY
GLUCOSE-CAPILLARY: 193 mg/dL — AB (ref 65–99)
GLUCOSE-CAPILLARY: 169 mg/dL — AB (ref 65–99)
GLUCOSE-CAPILLARY: 179 mg/dL — AB (ref 65–99)
GLUCOSE-CAPILLARY: 145 mg/dL — AB (ref 65–99)
GLUCOSE-CAPILLARY: 133 mg/dL — AB (ref 65–99)
GLUCOSE-CAPILLARY: 226 mg/dL — AB (ref 65–99)
Glucose-Capillary: 214 mg/dL — ABNORMAL HIGH (ref 65–99)
Glucose-Capillary: 171 mg/dL — ABNORMAL HIGH (ref 65–99)
Glucose-Capillary: 168 mg/dL — ABNORMAL HIGH (ref 65–99)
Glucose-Capillary: 124 mg/dL — ABNORMAL HIGH (ref 65–99)
Glucose-Capillary: 137 mg/dL — ABNORMAL HIGH (ref 65–99)
Glucose-Capillary: 116 mg/dL — ABNORMAL HIGH (ref 65–99)
Glucose-Capillary: 121 mg/dL — ABNORMAL HIGH (ref 65–99)
Glucose-Capillary: 78 mg/dL (ref 65–99)

## 2017-11-14 LAB — MRSA PCR SCREENING: MRSA BY PCR: NEGATIVE

## 2017-11-14 MED ORDER — GLUCERNA SHAKE PO LIQD
237.0000 mL | Freq: Three times a day (TID) | ORAL | Status: DC
  Administered 2017-11-15 – 2017-11-19 (×9): 237 mL via ORAL

## 2017-11-14 MED ORDER — TAMOXIFEN CITRATE 10 MG PO TABS
20.0000 mg | ORAL_TABLET | Freq: Every day | ORAL | Status: DC
  Administered 2017-11-14 – 2017-11-18 (×5): 20 mg via ORAL
  Filled 2017-11-14 (×8): qty 2

## 2017-11-14 MED ORDER — SODIUM CHLORIDE 0.9 % IV SOLN: INTRAVENOUS | Status: AC

## 2017-11-14 MED ORDER — SODIUM CHLORIDE 0.9 % IV SOLN: INTRAVENOUS | Status: DC

## 2017-11-14 MED ORDER — ADULT MULTIVITAMIN W/MINERALS CH
1.0000 | ORAL_TABLET | Freq: Every day | ORAL | Status: DC
  Administered 2017-11-15 – 2017-11-19 (×5): 1 via ORAL
  Filled 2017-11-14 (×5): qty 1

## 2017-11-14 MED ORDER — POTASSIUM CHLORIDE 10 MEQ/100ML IV SOLN
10.0000 meq | INTRAVENOUS | Status: AC
  Administered 2017-11-14 (×2): 10 meq via INTRAVENOUS
  Filled 2017-11-14 (×2): qty 100

## 2017-11-14 MED ORDER — INSULIN ASPART 100 UNIT/ML ~~LOC~~ SOLN
0.0000 [IU] | Freq: Three times a day (TID) | SUBCUTANEOUS | Status: DC
  Administered 2017-11-14: 1 [IU] via SUBCUTANEOUS
  Administered 2017-11-15 (×3): 3 [IU] via SUBCUTANEOUS
  Administered 2017-11-16: 12:00:00 5 [IU] via SUBCUTANEOUS
  Administered 2017-11-16: 7 [IU] via SUBCUTANEOUS
  Administered 2017-11-16: 5 [IU] via SUBCUTANEOUS
  Administered 2017-11-17: 09:00:00 3 [IU] via SUBCUTANEOUS
  Administered 2017-11-17 (×2): 7 [IU] via SUBCUTANEOUS
  Administered 2017-11-18: 3 [IU] via SUBCUTANEOUS
  Filled 2017-11-14 (×11): qty 1

## 2017-11-14 MED ORDER — ENALAPRIL MALEATE 5 MG PO TABS
5.0000 mg | ORAL_TABLET | Freq: Every day | ORAL | Status: DC
  Administered 2017-11-14 – 2017-11-19 (×5): 5 mg via ORAL
  Filled 2017-11-14 (×6): qty 1

## 2017-11-14 MED ORDER — INSULIN GLARGINE 100 UNIT/ML ~~LOC~~ SOLN
8.0000 [IU] | Freq: Every day | SUBCUTANEOUS | Status: DC
  Administered 2017-11-14 – 2017-11-17 (×4): 8 [IU] via SUBCUTANEOUS
  Filled 2017-11-14 (×4): qty 0.08

## 2017-11-14 MED ORDER — ENOXAPARIN SODIUM 30 MG/0.3ML ~~LOC~~ SOLN
30.0000 mg | SUBCUTANEOUS | Status: DC
  Administered 2017-11-14 – 2017-11-18 (×5): 30 mg via SUBCUTANEOUS
  Filled 2017-11-14 (×5): qty 0.3

## 2017-11-14 MED ORDER — INSULIN ASPART 100 UNIT/ML ~~LOC~~ SOLN
0.0000 [IU] | Freq: Every day | SUBCUTANEOUS | Status: DC
  Administered 2017-11-14 – 2017-11-15 (×2): 2 [IU] via SUBCUTANEOUS
  Administered 2017-11-16: 22:00:00 4 [IU] via SUBCUTANEOUS
  Administered 2017-11-17: 22:00:00 3 [IU] via SUBCUTANEOUS
  Filled 2017-11-14 (×4): qty 1

## 2017-11-14 MED ORDER — PRAVASTATIN SODIUM 20 MG PO TABS
20.0000 mg | ORAL_TABLET | Freq: Every day | ORAL | Status: DC
  Administered 2017-11-14 – 2017-11-18 (×5): 20 mg via ORAL
  Filled 2017-11-14 (×5): qty 1

## 2017-11-14 MED ORDER — DEXTROSE-NACL 5-0.45 % IV SOLN
INTRAVENOUS | Status: DC
  Administered 2017-11-14: 125 mL/h via INTRAVENOUS

## 2017-11-14 MED ORDER — ENOXAPARIN SODIUM 40 MG/0.4ML ~~LOC~~ SOLN: 40.0000 mg | SUBCUTANEOUS | Status: DC

## 2017-11-14 MED ORDER — DIFLUPREDNATE 0.05 % OP EMUL
1.0000 [drp] | OPHTHALMIC | Status: DC
  Administered 2017-11-14 – 2017-11-18 (×3): 1 [drp] via OPHTHALMIC
  Filled 2017-11-14: qty 5

## 2017-11-14 NOTE — Progress Notes (Signed)
[facility] [place]    MR#:  [id number]  DATE OF BIRTH:  06-Nov-1941  SUBJECTIVE: admitted for DKA, now resolved, patient is very pleasantly confused due to dementia.  CHIEF COMPLAINT:   Chief Complaint  Patient presents with  . Hyperglycemia    REVIEW OF SYSTEMS:   Review of Systems  Unable to perform ROS: Dementia     DRUG ALLERGIES:   Allergies  Allergen Reactions  . Lisinopril Rash and Other (See Comments)    Hyponatremia  Hyponatremia  Hyponatremia   . Sulfa Antibiotics Other (See Comments)    possible possible Patient doesn't know   . Sulfasalazine     VITALS:  Blood pressure (!) 119/53, pulse 95, temperature 99.8 F (37.7 C), temperature source Oral, resp. rate 20, height 5\' 4"  (1.626 m), weight 40.7 kg (89 lb 11.6 oz), SpO2 92 %.  PHYSICAL EXAMINATION:  GENERAL:  76 y.o.-year-old patient lying in the bed with no acute distress.  EYES: Pupils equal, round, reactive to light and accommodation. No scleral icterus. Extraocular muscles intact.  HEENT: Head atraumatic, normocephalic. Oropharynx and nasopharynx clear.  NECK:  Supple, no jugular venous distention. No thyroid enlargement, no tenderness.  LUNGS: Normal breath sounds bilaterally, no wheezing, rales,rhonchi or crepitation. No use of accessory muscles of respiration.  CARDIOVASCULAR: S1, S2 normal. No murmurs, rubs, or gallops.  ABDOMEN: Soft, nontender, nondistended. Bowel sounds present. No organomegaly or mass.  EXTREMITIES: No pedal edema, cyanosis, or clubbing.  NEUROLOGIC: Unable to do full neurologic exam because of dementia.   PSYCHIATRIC: The patient is awake but not oriented to time place due to dementia. SKIN: No obvious rash, lesion, or ulcer.    LABORATORY PANEL:   CBC Recent Labs  Lab 11/14/17 0102  WBC 13.6*  HGB 11.4*  HCT 33.6*  PLT 224    ------------------------------------------------------------------------------------------------------------------  Chemistries  Recent Labs  Lab 11/14/17 0446  NA 129*  K 4.0  CL 104  CO2 18*  GLUCOSE 184*  BUN 9  CREATININE 0.85  CALCIUM 7.8*   ------------------------------------------------------------------------------------------------------------------  Cardiac Enzymes Recent Labs  Lab 11/13/17 1845  TROPONINI 0.10*   ------------------------------------------------------------------------------------------------------------------  RADIOLOGY:  Dg Chest 2 View  Result Date: 11/13/2017 CLINICAL DATA:  Cough and dyspnea with high blood sugar EXAM: CHEST  2 VIEW COMPARISON:  06/08/2009 FINDINGS: Hyperinflation. No focal pulmonary infiltrate or effusion. Cardiomediastinal silhouette within normal limits with aortic atherosclerosis. No pneumothorax. Multiple old right-sided rib fractures. IMPRESSION: Hyperinflation without focal infiltrate or edema. Electronically Signed   By: Donavan Foil M.D.   On: 11/13/2017 21:06   Mr Brain Wo Contrast  Result Date: 11/14/2017 CLINICAL DATA:  Progressive weakness for 1 month, intermittent lethargy. DKA. Recent expressive aphasia. History of breast cancer, diabetes, hypertension, hypercholesterolemia. EXAM: MRI HEAD WITHOUT CONTRAST TECHNIQUE: Multiplanar, multiecho pulse sequences of the brain and surrounding structures were obtained without intravenous contrast. COMPARISON:  CT HEAD June 18, 2016 FINDINGS: Mildly motion degraded examination. BRAIN: No reduced diffusion to suggest acute ischemia. No susceptibility artifact to suggest hemorrhage. Moderate parenchymal brain volume loss, slightly greater than expected for age without hydrocephalus. Patchy to confluent supratentorial white matter FLAIR T2 hyperintensities. Old small RIGHT cerebellar infarct. No suspicious parenchymal signal, mass or mass effect. No abnormal extra-axial fluid  collections. VASCULAR: Normal major intracranial vascular flow voids present at skull base. SKULL AND UPPER CERVICAL SPINE: No abnormal sellar expansion. No suspicious calvarial bone marrow signal. Severe degenerative disc disease and  possible grade 1 C3-4 anterolisthesis included cervical spine. Craniocervical junction maintained. SINUSES/ORBITS: Trace RIGHT mastoid effusion. Paranasal sinuses are well-aerated. The included ocular globes and orbital contents are non-suspicious. Status post RIGHT ocular lens implant. OTHER: None. IMPRESSION: 1. No acute intracranial process on this motion degraded examination. 2. Moderate parenchymal brain volume loss. 3. Moderate chronic small vessel ischemic disease. Old small RIGHT cerebellar infarct. Electronically Signed   By: Elon Alas M.D.   On: 11/14/2017 13:39    EKG:   Orders placed or performed during the hospital encounter of 07/31/17  . ED EKG  . ED EKG  . EKG 12-Lead  . EKG 12-Lead    ASSESSMENT AND PLAN:   #1 generalized weakness, malaise, weakness and DKA: Initially received insulin drip, IV fluids, admitted to stepdown.  Now anion gap closed.  Patient moved out of ICU.  Continue sliding scale insulin with coverage, has dementia, poor p.o. intake. 2.  Expressive aphasia, confusion: MRI of the brain showed no stroke.  Physical therapy consult. 3.  Dementia, confusion: Physical therapy consult, possible placement.  #4 severe malnutrition in the context of chronic illness: Continue Glucerna. 4 history of breast cancer status post mastectomy, continue tamoxifen. 5.  Hyperlipidemia: Continue statins.   All the records are reviewed and case discussed with Care Management/Social Workerr. Management plans discussed with the patient, family and they are in agreement.  CODE STATUS: Full code  TOTAL TIME TAKING CARE OF THIS PATIENT: 35 minutes.   POSSIBLE D/C IN 1-2 DAYS, DEPENDING ON CLINICAL CONDITION.   Epifanio Lesches M.D on  11/14/2017 at 4:24 PM  Between 7am to 6pm - Pager - [phone]  After 6pm go to [email or web address] - password [email or web address] [facility]  Office  [phone]  CC: Primary care physician; Tracie Harrier, MD   Note: This dictation was prepared with Dragon dictation along with smaller phrase technology. Any transcriptional errors that result from this process are unintentional.

## 2017-11-14 NOTE — Progress Notes (Signed)
Initial Nutrition Assessment  DOCUMENTATION CODES:   Severe malnutrition in context of chronic illness, Underweight  INTERVENTION:  Provide Glucerna Shake po TID, each supplement provides 220 kcal and 10 grams of protein.  Provide daily MVI.  NUTRITION DIAGNOSIS:   Severe Malnutrition related to chronic illness(hx breast cancer s/p right mastectomy, advanced age) as evidenced by severe fat depletion, severe muscle depletion, 11.5% weight loss over 3.5 months.  GOAL:   Patient will meet greater than or equal to 90% of their needs  MONITOR:   PO intake, Supplement acceptance, Labs, Weight trends, I & O's, Skin  REASON FOR ASSESSMENT:   Other (Comment)(Low BMI)    ASSESSMENT:   76 year old female with PMHx of insulin dependent DM, HTN, OP, hypothyroidism, hx breast cancer s/p right mastectomy in 2011 admitted with DKA, expressive aphasia, AKI.   Met with patient at bedside. She is confused but able to provide some nutrition history. Husband not present at time of RD assessment. Patient reports her appetite has been poor for a while now, but she is unsure how long. She reports she lives at home with her husband. They no longer cook at home, but go out for all of their meals. She reports eating 3 meals per day. She is unable to describe usual intake or amounts of foods she eats at meals. She denies any N/V, abdominal pain, or difficulty chewing/swallowing.  Patient reports she is weight stable. However, per chart she is losing weight. She was 108.8 lbs 05/28/2016, 101.4 lbs 07/31/2017, and is currently 89.7 lbs. She has lost 11.7 lbs (11.5% body weight) over the past 3.5 months, which is significant for time frame.  Medications reviewed and include: Novolog 0-9 units TID, Novolog 0-5 units QHS, Lantus 8 units daily, insulin gtt.  Labs reviewed: CBG 116-179, Sodium 129, CO2 18.  Discussed with RN.  NUTRITION - FOCUSED PHYSICAL EXAM:    Most Recent Value  Orbital Region  Severe  depletion  Upper Arm Region  Severe depletion  Thoracic and Lumbar Region  Severe depletion  Buccal Region  Severe depletion  Temple Region  Severe depletion  Clavicle Bone Region  Severe depletion  Clavicle and Acromion Bone Region  Severe depletion  Scapular Bone Region  Severe depletion  Dorsal Hand  Severe depletion  Patellar Region  Severe depletion  Anterior Thigh Region  Severe depletion  Posterior Calf Region  Severe depletion  Edema (RD Assessment)  None  Hair  Reviewed  Eyes  Reviewed  Mouth  Reviewed  Skin  Reviewed  Nails  Reviewed     Diet Order:  Diet Carb Modified Fluid consistency: Thin; Room service appropriate? Yes  EDUCATION NEEDS:   Not appropriate for education at this time  Skin:  Skin Assessment: Reviewed RN Assessment(scattered ecchymosis)  Last BM:  11/13/2017  Height:   Ht Readings from Last 1 Encounters:  11/14/17 5' 4"  (1.626 m)    Weight:   Wt Readings from Last 1 Encounters:  11/14/17 89 lb 11.6 oz (40.7 kg)    Ideal Body Weight:  54.5 kg  BMI:  Body mass index is 15.4 kg/m.  Estimated Nutritional Needs:   Kcal:  1220-1425 (30-35 kcal/kg)  Protein:  60-70 grams (1.5-1.7 grams/kg)  Fluid:  1.2-1.4 L/day (1 mL/kcal)  Willey Blade, MS, RD, LDN Office: [phone] Pager: [phone] After Hours/Weekend Pager: [phone]

## 2017-11-14 NOTE — Progress Notes (Signed)
Attempted to call report. Left ascom number. Awaiting call back.

## 2017-11-14 NOTE — Progress Notes (Signed)
Upon morning assessment, pt is slurring with delayed responses. Per night shift,this is normal for her according to her family. Pt's pupils are also unequal, Right pupil is irregular. Pt's grip is weaker on the left than the right. [name] Reef, RN assessed patient and agreed with my findings. MRI already scheduled at noon, will discuss with Dr. Leonidas Romberg about changing order to stat. Will continue to assess.

## 2017-11-14 NOTE — Progress Notes (Signed)
Inpatient Diabetes Program Recommendations  AACE/ADA: New Consensus Statement on Inpatient Glycemic Control (2015)  Target Ranges:  Prepandial:   less than 140 mg/dL      Peak postprandial:   less than 180 mg/dL (1-2 hours)      Critically ill patients:  140 - 180 mg/dL   Lab Results  Component Value Date   GLUCAP 145 (H) 11/14/2017   HGBA1C 9.0 (H) 07/31/2017    Review of Glycemic Control  Results for [name], [name] (MRN [id number]) as of 11/14/2017 09:06  Ref. Range 11/14/2017 04:01 11/14/2017 05:04 11/14/2017 06:09 11/14/2017 07:01 11/14/2017 08:12  Glucose-Capillary Latest Ref Range: 65 - 99 mg/dL 193 (H) 169 (H) 179 (H) 124 (H) 145 (H)    Diabetes history:Type 1 diabetes-See's Dr. Gabriel Carina  Outpatient Diabetes medications: Lantus insulin 5 units qhs + Humalog insulin 3 units qAC + 1 unit per glucose of 50 mg/dl over a target of 200  Current orders for Inpatient glycemic control: IV insulin following the DKA order set.   Inpatient Diabetes Program Recommendations:  When the acidosis is corrected as determined by MD -Venous CO2=20, normal anion gap (8-12), negative ketones- , please consider transitioning to Lantus 5 units, 2 hours before insulin drip is discontinued. Consider custom scale of Novolog 0-5 units TID with meals and hs as follows: CBG 70-120    0 units  CBG 121-150  0 units CBG 151-200  1 unit CBG 201-250  2 units CBG 251-300  3 units CBG 301-350  4 units CBG 351-400  5 units   Please consider ordering Novolog 2 units TID with meals for meal coverage if patient eats at least 50% of meals.  Thanks,  Gentry Fitz, RN, BA, MHA, CDE Diabetes Coordinator Inpatient Diabetes Program  [phone] (Team Pager) [phone] ([place]) 11/14/2017 9:07 AM

## 2017-11-14 NOTE — Consult Note (Signed)
76 yo female admitted 12/26 with generalized weakness and malaise found to be in DKA, therefore insulin gtt initiated and pt admitted to [facility].  Pts husband also stated 2 weeks prior to presentation to the ER the pt developed some expressive aphasia and episodes of word salad, therefore MRI ordered by hospitalist team.  She is currently alert and oriented with expressive aphasia, bilateral hand grips moderate, no facial droop present, PERRLA, NSR on cardiac monitor, lungs clear throughout, bp stable, and remains on insulin gtt. PCCM will manage while in [place] Unit.   Marda Stalker, [place] Pager [phone] (please enter 7 digits) PCCM Consult Pager [phone] (please enter 7 digits)

## 2017-11-15 ENCOUNTER — Other Ambulatory Visit: Payer: Self-pay

## 2017-11-15 ENCOUNTER — Inpatient Hospital Stay: Payer: Medicare Other

## 2017-11-15 LAB — GLUCOSE, CAPILLARY
GLUCOSE-CAPILLARY: 193 mg/dL — AB (ref 65–99)
Glucose-Capillary: 215 mg/dL — ABNORMAL HIGH (ref 65–99)
Glucose-Capillary: 236 mg/dL — ABNORMAL HIGH (ref 65–99)
Glucose-Capillary: 205 mg/dL — ABNORMAL HIGH (ref 65–99)
Glucose-Capillary: 214 mg/dL — ABNORMAL HIGH (ref 65–99)

## 2017-11-15 MED ORDER — CEFTRIAXONE SODIUM 1 G IJ SOLR
1.0000 g | INTRAMUSCULAR | Status: DC
  Administered 2017-11-15 – 2017-11-18 (×4): 1 g via INTRAVENOUS
  Filled 2017-11-15 (×6): qty 10

## 2017-11-15 MED ORDER — DEXTROSE 5 % IV SOLN
500.0000 mg | Freq: Every day | INTRAVENOUS | Status: DC
Start: 2017-11-15 — End: 2017-11-16
  Administered 2017-11-15 – 2017-11-16 (×2): 500 mg via INTRAVENOUS
  Filled 2017-11-15 (×2): qty 500

## 2017-11-15 NOTE — Progress Notes (Signed)
Inpatient Diabetes Program Recommendations  AACE/ADA: New Consensus Statement on Inpatient Glycemic Control (2015)  Target Ranges:  Prepandial:   less than 140 mg/dL      Peak postprandial:   less than 180 mg/dL (1-2 hours)      Critically ill patients:  140 - 180 mg/dL   Lab Results  Component Value Date   GLUCAP 215 (H) 11/15/2017   HGBA1C 9.0 (H) 07/31/2017    Review of Glycemic Control  Results for BENTLEIGH, WAREN (MRN [id number]) as of 11/15/2017 11:28  Ref. Range 11/14/2017 12:11 11/14/2017 16:38 11/14/2017 20:41 11/15/2017 05:34 11/15/2017 07:34  Glucose-Capillary Latest Ref Range: 65 - 99 mg/dL 133 (H) 78 226 (H) 193 (H) 215 (H)   Diabetes history:Type 1 diabetes-See's Dr. Gabriel Carina  Outpatient Diabetes medications: Lantus insulin 5 units qhs + Humalog insulin 3 units qAC + 1 unit per glucose of 50 mg/dl over a target of 200  Current orders for Inpatient glycemic control:Lantus 8 units qday, Novolog 0-9 units tid, Novolog 0-5 units qhs  Inpatient Diabetes Program Recommendations:  Consider custom scale of Novolog 0-5 units TID with meals and hs as follows: CBG 70-1200 units  CBG 205 384 4131 units CBG [phone] unit CBG 219 688 8619 units CBG [phone] units CBG 301-350 4 units CBG [phone] units   Please consider ordering Novolog2 units TID with meals for meal coverage if patient eats at least 50% of meals.  Text paged Dr. Vianne Bulls at 11:35am  Gentry Fitz, RN, [place], [place], CDE Diabetes Coordinator Inpatient Diabetes Program  [phone] (Team Pager) [phone] ([place]) 11/15/2017 11:31 AM

## 2017-11-15 NOTE — Evaluation (Signed)
Physical Therapy Evaluation Patient Details Name: RADHA COGGINS MRN: [id number] DOB: [birth year] Today's Date: 11/15/2017   History of Present Illness  76 yo Female came to ED with diabetic ketoacidosis. Patient was at home and not feeling well. Her family reports that she has been getting weak and fatigued over last month.  Her family states that she was mixing up her words and slurred speec with expressive aphasia. MRI was negative for acute abnormality; She was just diagnosed with pneumonia earlier today;   Clinical Impression  76 yo Female came to ED with diabetic ketoacidosis; While in the hospital she has been diagnosed with pneumonia. Patient was very lethargic and fatigued during session; She had difficulty answering questions due to fatigue. Her husband was in the room and assisted with history intake. He reports that she has been independent with all ADLs prior to this recent bout of weakness. She lives in a single story home but does have 4 steps to enter house. Patient denies  Any recent falls. Currently she is max A for bed mobility; When sitting edge of bed she required mod-max A to maintain sitting posture. She is unsafe for sit<>stand transfers at this time and also unable to ambulate due to weakness and fatigue. Her BLE weakness is significant at 3-/5 bilaterally; patient would benefit from skilled PT Intervention to improve strength, balance and overall mobility; Recommend SNF rehab upon discharge as patient is unable to get in/out of her house at this time;     Follow Up Recommendations SNF;Supervision/Assistance - 24 hour    Equipment Recommendations  Other (comment)(defer to post acute setting; )    Recommendations for Other Services Rehab consult     Precautions / Restrictions Precautions Precautions: Fall Restrictions Weight Bearing Restrictions: No      Mobility  Bed Mobility Overal bed mobility: Needs Assistance Bed Mobility: Supine to Sit;Sit to Supine      Supine to sit: Max assist Sit to supine: Max assist   General bed mobility comments: requires max A for bed mobility with mod cues for hand placement and LE movement; Patient able to initiate scooting legs off bed but requires assistance for trunk movement;   Transfers                 General transfer comment: unable at this time due to lethargy and fatigue;   Ambulation/Gait             General Gait Details: unable at this time due to lethargy and fatigue;   Stairs            Wheelchair Mobility    Modified Rankin (Stroke Patients Only)       Balance Overall balance assessment: Needs assistance Sitting-balance support: Bilateral upper extremity supported;Feet supported Sitting balance-Leahy Scale: Poor Sitting balance - Comments: often leans to left side trying to lay down due to fatigue;  Postural control: Left lateral lean                                   Pertinent Vitals/Pain Pain Assessment: No/denies pain    Home Living Family/patient expects to be discharged to:: Private residence Living Arrangements: Spouse/significant other Available Help at Discharge: Family;Available 24 hours/day Type of Home: House Home Access: Stairs to enter Entrance Stairs-Rails: Left Entrance Stairs-Number of Steps: 3-4 Home Layout: One level Home Equipment: None      Prior Function Level of Independence: Independent  Comments: Mod indep with ADLs, household and limited community mobility; denies recent fall history. Did not use any assistive device prior to admittance;      Hand Dominance        Extremity/Trunk Assessment   Upper Extremity Assessment Upper Extremity Assessment: Generalized weakness    Lower Extremity Assessment Lower Extremity Assessment: RLE deficits/detail;LLE deficits/detail RLE Deficits / Details: intact light touch sensation; strength grossly 3-/5 LLE Deficits / Details: intact light touch sensation;  strength grossly 3-/5    Cervical / Trunk Assessment Cervical / Trunk Assessment: Kyphotic  Communication   Communication: No difficulties  Cognition Arousal/Alertness: Lethargic Behavior During Therapy: Flat affect Overall Cognitive Status: Difficult to assess                                 General Comments: patient heavily fatigued and takes a longer time to answer questions;       General Comments      Exercises     Assessment/Plan    PT Assessment Patient needs continued PT services  PT Problem List Decreased strength;Decreased mobility;Decreased safety awareness;Decreased activity tolerance;Decreased balance       PT Treatment Interventions DME instruction;Therapeutic activities;Gait training;Therapeutic exercise;Patient/family education;Stair training;Balance training;Functional mobility training;Neuromuscular re-education    PT Goals (Current goals can be found in the Care Plan section)  Acute Rehab PT Goals Patient Stated Goal: "I want to get better" PT Goal Formulation: With patient Time For Goal Achievement: 11/29/17 Potential to Achieve Goals: Good    Frequency Min 2X/week   Barriers to discharge Inaccessible home environment has 4 steps to enter house; unable to negotiate stairs at this time;     Co-evaluation               AM-PAC PT "6 Clicks" Daily Activity  Outcome Measure Difficulty turning over in bed (including adjusting bedclothes, sheets and blankets)?: A Lot Difficulty moving from lying on back to sitting on the side of the bed? : Unable Difficulty sitting down on and standing up from a chair with arms (e.g., wheelchair, bedside commode, etc,.)?: Unable Help needed moving to and from a bed to chair (including a wheelchair)?: Total Help needed walking in hospital room?: Total Help needed climbing 3-5 steps with a railing? : Total 6 Click Score: 7    End of Session   Activity Tolerance: Patient limited by  lethargy;Patient limited by fatigue Patient left: in bed;with bed alarm set;with family/visitor present;with call bell/phone within reach Nurse Communication: Mobility status PT Visit Diagnosis: Unsteadiness on feet (R26.81);Muscle weakness (generalized) (M62.81)    Time: 7209-4709 PT Time Calculation (min) (ACUTE ONLY): 13 min   Charges:   PT Evaluation $PT Eval Low Complexity: 1 Low     PT G Codes:   PT G-Codes **NOT FOR INPATIENT CLASS** Functional Assessment Tool Used: AM-PAC 6 Clicks Basic Mobility Functional Limitation: Mobility: Walking and moving around Mobility: Walking and Moving Around Current Status (G2836): At least 80 percent but less than 100 percent impaired, limited or restricted Mobility: Walking and Moving Around Goal Status [id number]): At least 60 percent but less than 80 percent impaired, limited or restricted      Deloris Mittag,MargaretPT, DPT 11/15/2017, 5:00 PM

## 2017-11-15 NOTE — Care Management Important Message (Signed)
Important Message  Patient Details  Name: SHANYN PREISLER MRN: [id number] Date of Birth: [birth year]   Medicare Important Message Given:  Yes    Shelbie Ammons, RN 11/15/2017, 6:56 AM

## 2017-11-15 NOTE — Progress Notes (Signed)
Pharmacy Antibiotic Note  [name] [name] is a 76 y.o. female admitted on 11/13/2017 with pneumonia.  Pharmacy has been consulted for ceftriaxone dosing.  Plan: Ceftriaxone 1 g IV daily  Height: 5\' 4"  (162.6 cm) Weight: 89 lb 11.6 oz (40.7 kg) IBW/kg (Calculated) : 54.7  Temp (24hrs), Avg:99.7 F (37.6 C), Min:99.4 F (37.4 C), Max:100.4 F (38 C)  Recent Labs  Lab 11/13/17 1845 11/14/17 0102 11/14/17 0446  WBC 15.6* 13.6*  --   CREATININE 1.04* 0.81 0.85    Estimated Creatinine Clearance: 35 mL/min (by C-G formula based on SCr of 0.85 mg/dL).    Allergies  Allergen Reactions  . Lisinopril Rash and Other (See Comments)    Hyponatremia  Hyponatremia  Hyponatremia   . Sulfa Antibiotics Other (See Comments)    possible possible Patient doesn't know   . Sulfasalazine    Antimicrobials this admission: ceftriaxone 12/28 >>  azithromycin 12/28 >>   Dose adjustments this admission:  Microbiology results: 12/27 MRSA PCR: negative  Thank you for allowing pharmacy to be a part of this patient's care.  Lenis Noon, PharmD, BCPS Clinical Pharmacist 11/15/2017 11:31 AM

## 2017-11-15 NOTE — Progress Notes (Signed)
[place] [facility] [place]    MR#:  [id number]  DATE OF BIRTH:  12/04/1940  SUBJECTIVE: admitted for DKA, now resolved, patient is very pleasantly confused due to dementia.  Patient daughter is at bedside.  Trying to eat some breakfast but has lots of cough.  Low-grade temperature last night.  CHIEF COMPLAINT:   Chief Complaint  Patient presents with  . Hyperglycemia    REVIEW OF SYSTEMS:   Review of Systems  Unable to perform ROS: Dementia     DRUG ALLERGIES:   Allergies  Allergen Reactions  . Lisinopril Rash and Other (See Comments)    Hyponatremia  Hyponatremia  Hyponatremia   . Sulfa Antibiotics Other (See Comments)    possible possible Patient doesn't know   . Sulfasalazine     VITALS:  Blood pressure (!) 106/57, pulse (!) 110, temperature 99.5 F (37.5 C), temperature source Oral, resp. rate 20, height 5\' 4"  (1.626 m), weight 40.7 kg (89 lb 11.6 oz), SpO2 94 %.  PHYSICAL EXAMINATION:  GENERAL:  76 y.o.-year-old patient lying in the bed with no acute distress.  EYES: Pupils equal, round, reactive to light and accommodation. No scleral icterus. Extraocular muscles intact.  HEENT: Head atraumatic, normocephalic. Oropharynx and nasopharynx clear.  NECK:  Supple, no jugular venous distention. No thyroid enlargement, no tenderness.  LUNGS: Normal breath sounds bilaterally, no wheezing, rales,rhonchi or crepitation. No use of accessory muscles of respiration.  Mostly clear to auscultation. CARDIOVASCULAR: S1, S2 normal. No murmurs, rubs, or gallops.  ABDOMEN: Soft, nontender, nondistended. Bowel sounds present. No organomegaly or mass.  EXTREMITIES: No pedal edema, cyanosis, or clubbing.  NEUROLOGIC: Unable to do full neurologic exam because of dementia.   PSYCHIATRIC: The patient is awake but not oriented to time place due to dementia. SKIN: No obvious rash, lesion, or ulcer.    LABORATORY PANEL:    CBC Recent Labs  Lab 11/14/17 0102  WBC 13.6*  HGB 11.4*  HCT 33.6*  PLT 224   ------------------------------------------------------------------------------------------------------------------  Chemistries  Recent Labs  Lab 11/14/17 0446  NA 129*  K 4.0  CL 104  CO2 18*  GLUCOSE 184*  BUN 9  CREATININE 0.85  CALCIUM 7.8*   ------------------------------------------------------------------------------------------------------------------  Cardiac Enzymes Recent Labs  Lab 11/13/17 1845  TROPONINI 0.10*   ------------------------------------------------------------------------------------------------------------------  RADIOLOGY:  Dg Chest 2 View  Result Date: 11/13/2017 CLINICAL DATA:  Cough and dyspnea with high blood sugar EXAM: CHEST  2 VIEW COMPARISON:  06/08/2009 FINDINGS: Hyperinflation. No focal pulmonary infiltrate or effusion. Cardiomediastinal silhouette within normal limits with aortic atherosclerosis. No pneumothorax. Multiple old right-sided rib fractures. IMPRESSION: Hyperinflation without focal infiltrate or edema. Electronically Signed   By: Donavan Foil M.D.   On: 11/13/2017 21:06   Mr Brain Wo Contrast  Result Date: 11/14/2017 CLINICAL DATA:  Progressive weakness for 1 month, intermittent lethargy. DKA. Recent expressive aphasia. History of breast cancer, diabetes, hypertension, hypercholesterolemia. EXAM: MRI HEAD WITHOUT CONTRAST TECHNIQUE: Multiplanar, multiecho pulse sequences of the brain and surrounding structures were obtained without intravenous contrast. COMPARISON:  CT HEAD June 18, 2016 FINDINGS: Mildly motion degraded examination. BRAIN: No reduced diffusion to suggest acute ischemia. No susceptibility artifact to suggest hemorrhage. Moderate parenchymal brain volume loss, slightly greater than expected for age without hydrocephalus. Patchy to confluent supratentorial white matter FLAIR T2 hyperintensities. Old small RIGHT cerebellar  infarct. No suspicious parenchymal signal, mass or mass effect. No abnormal extra-axial fluid collections. VASCULAR: Normal major intracranial  vascular flow voids present at skull base. SKULL AND UPPER CERVICAL SPINE: No abnormal sellar expansion. No suspicious calvarial bone marrow signal. Severe degenerative disc disease and possible grade 1 C3-4 anterolisthesis included cervical spine. Craniocervical junction maintained. SINUSES/ORBITS: Trace RIGHT mastoid effusion. Paranasal sinuses are well-aerated. The included ocular globes and orbital contents are non-suspicious. Status post RIGHT ocular lens implant. OTHER: None. IMPRESSION: 1. No acute intracranial process on this motion degraded examination. 2. Moderate parenchymal brain volume loss. 3. Moderate chronic small vessel ischemic disease. Old small RIGHT cerebellar infarct. Electronically Signed   By: Elon Alas M.D.   On: 11/14/2017 13:39    EKG:   Orders placed or performed during the hospital encounter of 07/31/17  . ED EKG  . ED EKG  . EKG 12-Lead  . EKG 12-Lead    ASSESSMENT AND PLAN:   #1. generalized weakness, malaise, weakness and DKA: Initially received insulin drip, IV fluids, admitted to stepdown.  Now anion gap closed.  Patient moved out of ICU.  Continue sliding scale insulin with coverage, has dementia, poor p.o. Intake.  Continue low-dose Lantus, sliding scale insulin coverage.   2.  Expressive aphasia, confusion: MRI of the brain showed no stroke.  Physical therapy consulted for final disposition.  Discussed with patient's family.. 3.  Dementia, confusion: Physical therapy consult, possible placement.  #4 severe malnutrition in the context of chronic illness: Continue Glucerna. 4 history of breast cancer status post mastectomy, continue tamoxifen. 5.  Hyperlipidemia: Continue statins. #5 fever, cough: Added azithromycin, chest x-ray today to evaluate for pneumonia.  Discussed with patient's nurse Maddy, also  discussed with patient's daughter also.  All the records are reviewed and case discussed with Care Management/Social Workerr. Management plans discussed with the patient, family and they are in agreement.  CODE STATUS: Full code, discussed the CODE STATUS with patient's daughter, she will get in touch with her father about theCODE STATUS.  TOTAL TIME TAKING CARE OF THIS PATIENT: 35 minutes.   POSSIBLE D/C IN 1-2 DAYS, DEPENDING ON CLINICAL CONDITION.   Epifanio Lesches M.D on 11/15/2017 at 10:26 AM  Between 7am to 6pm - Pager - [phone]  After 6pm go to [email or web address] - password [email or web address] [facility]  Office  [phone]  CC: Primary care physician; Tracie Harrier, MD   Note: This dictation was prepared with Dragon dictation along with smaller phrase technology. Any transcriptional errors that result from this process are unintentional.

## 2017-11-16 LAB — GLUCOSE, CAPILLARY
Glucose-Capillary: 256 mg/dL — ABNORMAL HIGH (ref 65–99)
Glucose-Capillary: 286 mg/dL — ABNORMAL HIGH (ref 65–99)
Glucose-Capillary: 318 mg/dL — ABNORMAL HIGH (ref 65–99)
Glucose-Capillary: 324 mg/dL — ABNORMAL HIGH (ref 65–99)

## 2017-11-16 MED ORDER — AZITHROMYCIN 500 MG PO TABS
500.0000 mg | ORAL_TABLET | Freq: Every day | ORAL | Status: DC
  Administered 2017-11-17 – 2017-11-18 (×2): 500 mg via ORAL
  Filled 2017-11-16 (×3): qty 1

## 2017-11-16 MED ORDER — SODIUM CHLORIDE 0.9% FLUSH: 3.0000 mL | INTRAVENOUS | Status: DC | PRN

## 2017-11-16 MED ORDER — SODIUM CHLORIDE 0.9% FLUSH
3.0000 mL | Freq: Two times a day (BID) | INTRAVENOUS | Status: DC
  Administered 2017-11-16 – 2017-11-19 (×7): 3 mL via INTRAVENOUS

## 2017-11-16 MED ORDER — SODIUM CHLORIDE 0.9 % IV BOLUS (SEPSIS)
1000.0000 mL | Freq: Once | INTRAVENOUS | Status: AC
  Administered 2017-11-16: 06:00:00 1000 mL via INTRAVENOUS

## 2017-11-16 MED ORDER — SODIUM CHLORIDE 0.9 % IV SOLN
INTRAVENOUS | Status: DC
Start: 2017-11-16 — End: 2017-11-18
  Administered 2017-11-16 – 2017-11-18 (×3): via INTRAVENOUS

## 2017-11-16 MED ORDER — ACETAMINOPHEN 325 MG PO TABS
650.0000 mg | ORAL_TABLET | Freq: Four times a day (QID) | ORAL | Status: DC | PRN
  Administered 2017-11-16 – 2017-11-17 (×3): 650 mg via ORAL
  Filled 2017-11-16 (×3): qty 2

## 2017-11-16 NOTE — NC FL2 (Signed)
[place] LEVEL OF CARE SCREENING TOOL     IDENTIFICATION  Patient Name: [name] [name] Birthdate: 1941/07/15 Sex: female Admission Date (Current Location): 11/13/2017  [place] and [place] Number:  Engineering geologist and Address:  [facility], [address], [place], [place] [address]      Provider Number: [id number]  Attending Physician Name and Address:  Epifanio Lesches, MD  Relative Name and Phone Number:  Colbie Sliker (spouse) [phone]    Current Level of Care: Hospital Recommended Level of Care: [place] Prior Approval Number:    Date Approved/Denied: 11/16/17 PASRR Number: [id number]  Discharge Plan: SNF    Current Diagnoses: Patient Active Problem List   Diagnosis Date Noted  . Protein-calorie malnutrition, severe 11/14/2017  . AKI (acute kidney injury) ([place]) 11/13/2017  . DKA (diabetic ketoacidoses) ([place]) 07/31/2017  . Hyponatremia 06/18/2016  . Osteoporosis 04/23/2016  . Malignant neoplasm of breast ([place]) 03/30/2016  . Hyperlipemia 03/30/2016  . BP (high blood pressure) 03/30/2016  . Hypothyroidism, unspecified 03/30/2016  . Type 1 diabetes mellitus with hypoglycemia and without coma ([place]) 02/10/2016  . Type 1 diabetes mellitus without complication ([place]) 18/29/9371  . Dermatophytosis of nail 08/24/2013  . Pain in lower limb 08/24/2013    Orientation RESPIRATION BLADDER Height & Weight     Self, Time  Normal Incontinent Weight: 89 lb 11.6 oz (40.7 kg) Height:  5\' 4"  (162.6 cm)  BEHAVIORAL SYMPTOMS/MOOD NEUROLOGICAL BOWEL NUTRITION STATUS      Continent Diet(Carb Modified)  AMBULATORY STATUS COMMUNICATION OF NEEDS Skin   Extensive Assist Verbally Normal                       Personal Care Assistance Level of Assistance  Bathing, Feeding, Dressing Bathing Assistance: Limited assistance Feeding assistance: Independent Dressing Assistance: Limited assistance     Functional  Limitations Info             SPECIAL CARE FACTORS FREQUENCY  PT (By licensed PT)     PT Frequency: Up to 5X per day, 5 days per week              Contractures Contractures Info: Not present    Additional Factors Info  Code Status, Allergies, Insulin Sliding Scale Code Status Info: Full Allergies Info: Lisinopril, Sulfa Antibiotics, Sulfasalazine   Insulin Sliding Scale Info: Novolog: 0-5 units QHS and 0-9 units TID with meals; Lantus: 8 units qd       Current Medications (11/16/2017):  This is the current hospital active medication list Current Facility-Administered Medications  Medication Dose Route Frequency Provider Last Rate Last Dose  . 0.9 %  sodium chloride infusion   Intravenous Continuous Epifanio Lesches, MD 50 mL/hr at 11/16/17 1035    . acetaminophen (TYLENOL) tablet 650 mg  650 mg Oral Q6H PRN Epifanio Lesches, MD   650 mg at 11/16/17 1307  . [START ON 11/17/2017] azithromycin (ZITHROMAX) tablet 500 mg  500 mg Oral Daily Ramond Dial, RPH      . cefTRIAXone (ROCEPHIN) 1 g in dextrose 5 % 50 mL IVPB  1 g Intravenous Q24H Lenis Noon, [facility]   Stopped at 11/16/17 1233  . Difluprednate 0.05 % EMUL 1 drop  1 drop Right Eye Jeannie Done, MD   1 drop at 11/16/17 [phone]  . enalapril (VASOTEC) tablet 5 mg  5 mg Oral Daily Lance Coon, MD   5 mg at 11/16/17 0827  . enoxaparin (LOVENOX)  injection 30 mg  30 mg Subcutaneous Q24H Wilhelmina Mcardle, MD   30 mg at 11/15/17 2101  . feeding supplement (GLUCERNA SHAKE) (GLUCERNA SHAKE) liquid 237 mL  237 mL Oral TID BM Wilhelmina Mcardle, MD   237 mL at 11/16/17 1207  . insulin aspart (novoLOG) injection 0-5 Units  0-5 Units Subcutaneous QHS Wilhelmina Mcardle, MD   2 Units at 11/15/17 2100  . insulin aspart (novoLOG) injection 0-9 Units  0-9 Units Subcutaneous TID WC Wilhelmina Mcardle, MD   5 Units at 11/16/17 1206  . insulin glargine (LANTUS) injection 8 Units  8 Units Subcutaneous Daily Wilhelmina Mcardle, MD   8  Units at 11/16/17 0830  . multivitamin with minerals tablet 1 tablet  1 tablet Oral Daily Wilhelmina Mcardle, MD   1 tablet at 11/16/17 [phone]  . pravastatin (PRAVACHOL) tablet 20 mg  20 mg Oral q1800 Lance Coon, MD   20 mg at 11/15/17 1709  . sodium chloride flush (NS) 0.9 % injection 3 mL  3 mL Intravenous Q12H Epifanio Lesches, MD   3 mL at 11/16/17 0926  . sodium chloride flush (NS) 0.9 % injection 3 mL  3 mL Intravenous PRN Epifanio Lesches, MD      . tamoxifen (NOLVADEX) tablet 20 mg  20 mg Oral Corwin Levins, MD   20 mg at 11/15/17 2101     Discharge Medications: Please see discharge summary for a list of discharge medications.  Relevant Imaging Results:  Relevant Lab Results:   Additional Information SS# [id number]  Zettie Pho, LCSW

## 2017-11-16 NOTE — Progress Notes (Signed)
PHARMACIST - PHYSICIAN COMMUNICATION DR:   Vianne Bulls CONCERNING: Antibiotic IV to Oral Route Change Policy  RECOMMENDATION: This patient is receiving azithromycin by the intravenous route.  Based on criteria approved by the Pharmacy and Therapeutics Committee, the antibiotic(s) is/are being converted to the equivalent oral dose form(s).   DESCRIPTION: These criteria include:  Patient being treated for a respiratory tract infection, urinary tract infection, cellulitis or clostridium difficile associated diarrhea if on metronidazole  The patient is not neutropenic and does not exhibit a GI malabsorption state  The patient is eating (either orally or via tube) and/or has been taking other orally administered medications for a least 24 hours  The patient is improving clinically and has a Tmax < 100.5  If you have questions about this conversion, please contact the Pharmacy Department  []   [phone] )  [facility] [x]   [phone] )  [facility] []   [phone] )  [facility] []   [phone] )  [facility] []   [phone] )  [facility]

## 2017-11-16 NOTE — Care Management Note (Signed)
Case Management Note  Patient Details  Name: [name] [name] MRN: [id number] Date of Birth: 08-29-1941  Subjective/Objective:    [facility]-PT is recommending SNF. CSW is following for placement in Rehab needs.                 Action/Plan:   Expected Discharge Date:  11/16/17               Expected Discharge Plan:     In-House Referral:     Discharge planning Services     Post Acute Care Choice:    Choice offered to:     DME Arranged:    DME Agency:     HH Arranged:    HH Agency:     Status of Service:     If discussed at [employer], dates discussed:    Additional Comments:  [name] A, RN 11/16/2017, 3:59 PM

## 2017-11-16 NOTE — Progress Notes (Signed)
Pt is tachy at 112 and sodium is 129.  MD ordered 1L bolus of NS to be given over 2 hours.

## 2017-11-16 NOTE — Progress Notes (Addendum)
Poor appetite with pt only taking bites. Glucerna given with small amounts frequently. Occasional weak, congested cough. IVF's started with continued azithromycin/rocephin IV. Temp 100.8 with MD notified with tylenol given as ordered. Turned/repositioned every two hours. Voiding incontinent. Generalized malaise and weakness.

## 2017-11-16 NOTE — Progress Notes (Signed)
[place] [facility] [place]    MR#:  [id number]  DATE OF BIRTH:  December 31, 1940  SUBJECTIVE: admitted for DKA, now resolved, patient is very pleasantly confused due to dementia.    CHIEF COMPLAINT:   Chief Complaint  Patient presents with  . Hyperglycemia    REVIEW OF SYSTEMS:   Review of Systems  Unable to perform ROS: Dementia     DRUG ALLERGIES:   Allergies  Allergen Reactions  . Lisinopril Rash and Other (See Comments)    Hyponatremia  Hyponatremia  Hyponatremia   . Sulfa Antibiotics Other (See Comments)    possible possible Patient doesn't know   . Sulfasalazine     VITALS:  Blood pressure 134/61, pulse (!) 104, temperature 99.6 F (37.6 C), temperature source Oral, resp. rate 18, height 5\' 4"  (1.626 m), weight 40.7 kg (89 lb 11.6 oz), SpO2 96 %.  PHYSICAL EXAMINATION:  GENERAL:  76 y.o.-year-old patient lying in the bed with no acute distress.  EYES: Pupils equal, round, reactive to light and accommodation. No scleral icterus. Extraocular muscles intact.  HEENT: Head atraumatic, normocephalic. Oropharynx and nasopharynx clear.  NECK:  Supple, no jugular venous distention. No thyroid enlargement, no tenderness.  LUNGS: Normal breath sounds bilaterally, no wheezing, rales,rhonchi or crepitation. No use of accessory muscles of respiration.  Mostly clear to auscultation. CARDIOVASCULAR: S1, S2 normal. No murmurs, rubs, or gallops.  ABDOMEN: Soft, nontender, nondistended. Bowel sounds present. No organomegaly or mass.  EXTREMITIES: No pedal edema, cyanosis, or clubbing.  NEUROLOGIC: Unable to do full neurologic exam because of dementia.   PSYCHIATRIC: The patient is awake but not oriented to time place due to dementia. SKIN: No obvious rash, lesion, or ulcer.    LABORATORY PANEL:   CBC Recent Labs  Lab 11/14/17 0102  WBC 13.6*  HGB 11.4*  HCT 33.6*  PLT 224    ------------------------------------------------------------------------------------------------------------------  Chemistries  Recent Labs  Lab 11/14/17 0446  NA 129*  K 4.0  CL 104  CO2 18*  GLUCOSE 184*  BUN 9  CREATININE 0.85  CALCIUM 7.8*   ------------------------------------------------------------------------------------------------------------------  Cardiac Enzymes Recent Labs  Lab 11/13/17 1845  TROPONINI 0.10*   ------------------------------------------------------------------------------------------------------------------  RADIOLOGY:  Dg Chest 1 View  Result Date: 11/15/2017 CLINICAL DATA:  Cough. EXAM: CHEST 1 VIEW COMPARISON:  11/13/2017. FINDINGS: Mediastinum hilar structures normal. Left lung is clear. Right base infiltrate consistent with pneumonia. Small right pleural effusion. Old right rib fractures. No pneumothorax. IMPRESSION: Right base infiltrate consistent with pneumonia. Electronically Signed   By: Marcello Moores  Register   On: 11/15/2017 10:51   Mr Brain Wo Contrast  Result Date: 11/14/2017 CLINICAL DATA:  Progressive weakness for 1 month, intermittent lethargy. DKA. Recent expressive aphasia. History of breast cancer, diabetes, hypertension, hypercholesterolemia. EXAM: MRI HEAD WITHOUT CONTRAST TECHNIQUE: Multiplanar, multiecho pulse sequences of the brain and surrounding structures were obtained without intravenous contrast. COMPARISON:  CT HEAD June 18, 2016 FINDINGS: Mildly motion degraded examination. BRAIN: No reduced diffusion to suggest acute ischemia. No susceptibility artifact to suggest hemorrhage. Moderate parenchymal brain volume loss, slightly greater than expected for age without hydrocephalus. Patchy to confluent supratentorial white matter FLAIR T2 hyperintensities. Old small RIGHT cerebellar infarct. No suspicious parenchymal signal, mass or mass effect. No abnormal extra-axial fluid collections. VASCULAR: Normal major intracranial  vascular flow voids present at skull base. SKULL AND UPPER CERVICAL SPINE: No abnormal sellar expansion. No suspicious calvarial bone marrow signal. Severe degenerative disc disease and  possible grade 1 C3-4 anterolisthesis included cervical spine. Craniocervical junction maintained. SINUSES/ORBITS: Trace RIGHT mastoid effusion. Paranasal sinuses are well-aerated. The included ocular globes and orbital contents are non-suspicious. Status post RIGHT ocular lens implant. OTHER: None. IMPRESSION: 1. No acute intracranial process on this motion degraded examination. 2. Moderate parenchymal brain volume loss. 3. Moderate chronic small vessel ischemic disease. Old small RIGHT cerebellar infarct. Electronically Signed   By: Elon Alas M.D.   On: 11/14/2017 13:39    EKG:   Orders placed or performed during the hospital encounter of 07/31/17  . ED EKG  . ED EKG  . EKG 12-Lead  . EKG 12-Lead    ASSESSMENT AND PLAN:   #1. generalized weakness, malaise, weakness and DKA: Initially received insulin drip, IV fluids, admitted to stepdown.  Now anion gap closed.  Patient moved out of ICU.  Continue sliding scale insulin with coverage, has dementia, poor p.o. Intake.  Continue low-dose Lantus, sliding scale insulin coverage.   2.  Expressive aphasia, confusion: MRI of the brain showed no stroke.  Physical therapy consulted for final disposition.  Discussed with patient's family.. 3.  Dementia, confusion: Physical therapy consult, possible placement.  #4 severe malnutrition in the context of chronic illness: Continue Glucerna. 4 history of breast cancer status post mastectomy, continue tamoxifen. 5.  Hyperlipidemia: Continue statins. #6.  Community-acquired pneumonia: Continue Rocephin, Zithromax, patient has low-grade fever, tachycardia,,continue iv fluids  All the records are reviewed and case discussed with Care Management/Social Workerr. Management plans discussed with the patient, family and they  are in agreement.  CODE STATUS: Full code, discussed the CODE STATUS with patient's daughter, she will get in touch with her father about theCODE STATUS.  TOTAL TIME TAKING CARE OF THIS PATIENT: 35 minutes.   POSSIBLE D/C IN 1-2 DAYS, DEPENDING ON CLINICAL CONDITION.   Epifanio Lesches M.D on 11/16/2017 at 10:23 AM  Between 7am to 6pm - Pager - [phone]  After 6pm go to [email or web address] - password [email or web address] [place] [facility]  Office  [phone]  CC: Primary care physician; Tracie Harrier, MD   Note: This dictation was prepared with Dragon dictation along with smaller phrase technology. Any transcriptional errors that result from this process are unintentional.

## 2017-11-17 LAB — GLUCOSE, CAPILLARY
GLUCOSE-CAPILLARY: 289 mg/dL — AB (ref 65–99)
Glucose-Capillary: 231 mg/dL — ABNORMAL HIGH (ref 65–99)
Glucose-Capillary: 328 mg/dL — ABNORMAL HIGH (ref 65–99)
Glucose-Capillary: 317 mg/dL — ABNORMAL HIGH (ref 65–99)

## 2017-11-17 MED ORDER — DOCUSATE SODIUM 100 MG PO CAPS
100.0000 mg | ORAL_CAPSULE | Freq: Two times a day (BID) | ORAL | Status: DC
  Administered 2017-11-17 – 2017-11-19 (×5): 100 mg via ORAL
  Filled 2017-11-17 (×6): qty 1

## 2017-11-17 MED ORDER — LACTULOSE 10 GM/15ML PO SOLN
20.0000 g | Freq: Every day | ORAL | Status: DC | PRN
  Administered 2017-11-17 – 2017-11-18 (×2): 20 g via ORAL
  Filled 2017-11-17 (×2): qty 30

## 2017-11-17 MED ORDER — INSULIN GLARGINE 100 UNIT/ML ~~LOC~~ SOLN
10.0000 [IU] | Freq: Every day | SUBCUTANEOUS | Status: DC
  Administered 2017-11-18 – 2017-11-19 (×2): 10 [IU] via SUBCUTANEOUS
  Filled 2017-11-17 (×2): qty 0.1

## 2017-11-17 NOTE — Progress Notes (Signed)
[facility] at [place] NAME: [name] [name]    MR#:  [id number]  DATE OF BIRTH:  05/16/1941  She is more alert today, has cough.  CHIEF COMPLAINT:   Chief Complaint  Patient presents with  . Hyperglycemia    REVIEW OF SYSTEMS:   Review of Systems  Unable to perform ROS: Dementia     DRUG ALLERGIES:   Allergies  Allergen Reactions  . Lisinopril Rash and Other (See Comments)    Hyponatremia  Hyponatremia  Hyponatremia   . Sulfa Antibiotics Other (See Comments)    possible possible Patient doesn't know   . Sulfasalazine     VITALS:  Blood pressure 123/78, pulse (!) 102, temperature 98.8 F (37.1 C), temperature source Oral, resp. rate 20, height 5\' 4"  (1.626 m), weight 40.7 kg (89 lb 11.6 oz), SpO2 96 %.  PHYSICAL EXAMINATION:  GENERAL:  76 y.o.-year-old patient lying in the bed with no acute distress.  EYES: Pupils equal, round, reactive to light and accommodation. No scleral icterus. Extraocular muscles intact.  HEENT: Head atraumatic, normocephalic. Oropharynx and nasopharynx clear.  NECK:  Supple, no jugular venous distention. No thyroid enlargement, no tenderness.  LUNGS: Normal breath sounds bilaterally, no wheezing, rales,rhonchi or crepitation. No use of accessory muscles of respiration.  Mostly clear to auscultation. CARDIOVASCULAR: S1, S2 normal. No murmurs, rubs, or gallops.  ABDOMEN: Soft, nontender, nondistended. Bowel sounds present. No organomegaly or mass.  EXTREMITIES: No pedal edema, cyanosis, or clubbing.  NEUROLOGIC: Unable to do full neurologic exam because of dementia.   PSYCHIATRIC: The patient is awake but not oriented to time place due to dementia. SKIN: No obvious rash, lesion, or ulcer.    LABORATORY PANEL:   CBC Recent Labs  Lab 11/14/17 0102  WBC 13.6*  HGB 11.4*  HCT 33.6*  PLT 224    ------------------------------------------------------------------------------------------------------------------  Chemistries  Recent Labs  Lab 11/14/17 0446  NA 129*  K 4.0  CL 104  CO2 18*  GLUCOSE 184*  BUN 9  CREATININE 0.85  CALCIUM 7.8*   ------------------------------------------------------------------------------------------------------------------  Cardiac Enzymes Recent Labs  Lab 11/13/17 1845  TROPONINI 0.10*   ------------------------------------------------------------------------------------------------------------------  RADIOLOGY:  No results found.  EKG:   Orders placed or performed during the hospital encounter of 07/31/17  . ED EKG  . ED EKG  . EKG 12-Lead  . EKG 12-Lead    ASSESSMENT AND PLAN:   #1. generalized weakness, malaise, weakness and DKA: Initially received insulin drip, IV fluids, admitted to stepdown.  Now anion gap closed.  Patient moved out of ICU.  Continue sliding scale insulin with coverage, has dementia, poor p.o. Intake.  Continue low-dose Lantus, sliding scale insulin coverage.   2.  Expressive aphasia, confusion: MRI of the brain showed no stroke.  Physical therapy consulted for final disposition.  Discussed with patient's family.. 3.  Dementia, confusion: Physical therapy, recommended skilled nursing.  Discharge when arrangements are made.  #4 severe malnutrition in the context of chronic illness: Continue Glucerna. 4 history of breast cancer status post mastectomy, continue tamoxifen. 5.  Hyperlipidemia: Continue statins. #6.  Community-acquired pneumonia: Continue Rocephin, Zithromax, patient has low-grade fever, tachycardia,,continue iv fluids  All the records are reviewed and case discussed with Care Management/Social Workerr. Management plans discussed with the patient, family and they are in agreement.  CODE STATUS: Full code, discussed the CODE STATUS with patient's daughter, she will get in touch with her  father about theCODE STATUS.  TOTAL TIME TAKING  CARE OF THIS PATIENT: 35 minutes.   POSSIBLE D/C IN 1-2 DAYS, DEPENDING ON CLINICAL CONDITION.   Epifanio Lesches M.D on 11/17/2017 at 11:21 AM  Between 7am to 6pm - Pager - [phone]  After 6pm go to [email or web address] - password [email or web address] [facility]  Office  [phone]  CC: Primary care physician; Tracie Harrier, MD   Note: This dictation was prepared with Dragon dictation along with smaller phrase technology. Any transcriptional errors that result from this process are unintentional.

## 2017-11-17 NOTE — Progress Notes (Signed)
More alert at intervals. Eating small amounts/drinks slowly with limited appetite. Family in . Skin moist/dry. Intermittent fever still noted with weak, occasional congested nonproductive cough. POCT blood sugars increasing into the 300 ranges at intervals.

## 2017-11-17 NOTE — Clinical Social Work Note (Signed)
CSW attempted to contact the patient's spouse to discuss discharge planning. [name] [name] stated that he would rather discuss tomorrow in person. CSW has begun referral process for STR should that be the choice of the family to pursue. CSW is following.  Santiago Bumpers, MSW, Latanya Presser [phone]

## 2017-11-18 ENCOUNTER — Inpatient Hospital Stay: Payer: Medicare Other

## 2017-11-18 LAB — BASIC METABOLIC PANEL
ANION GAP: 9 (ref 5–15)
BUN: 10 mg/dL (ref 6–20)
CHLORIDE: 102 mmol/L (ref 101–111)
CO2: 22 mmol/L (ref 22–32)
Calcium: 7.4 mg/dL — ABNORMAL LOW (ref 8.9–10.3)
Creatinine, Ser: 0.68 mg/dL (ref 0.44–1.00)
GFR calc non Af Amer: >60 mL/min (ref >60)
GLUCOSE: 227 mg/dL — AB (ref 65–99)
Potassium: 3.8 mmol/L (ref 3.5–5.1)
Sodium: 133 mmol/L — ABNORMAL LOW (ref 135–145)

## 2017-11-18 LAB — CBC
HEMATOCRIT: 32.7 % — AB (ref 35.0–47.0)
HEMOGLOBIN: 11.1 g/dL — AB (ref 12.0–16.0)
MCH: 32.6 pg (ref 26.0–34.0)
MCHC: 34 g/dL (ref 32.0–36.0)
MCV: 95.9 fL (ref 80.0–100.0)
Platelets: 368 10*3/uL (ref 150–440)
RBC: 3.41 MIL/uL — ABNORMAL LOW (ref 3.80–5.20)
RDW: 13.7 % (ref 11.5–14.5)
WBC: 11.4 10*3/uL — AB (ref 3.6–11.0)

## 2017-11-18 LAB — GLUCOSE, CAPILLARY
GLUCOSE-CAPILLARY: 233 mg/dL — AB (ref 65–99)
GLUCOSE-CAPILLARY: 206 mg/dL — AB (ref 65–99)
GLUCOSE-CAPILLARY: 172 mg/dL — AB (ref 65–99)
Glucose-Capillary: 206 mg/dL — ABNORMAL HIGH (ref 65–99)

## 2017-11-18 MED ORDER — COLCHICINE 0.6 MG PO TABS
0.6000 mg | ORAL_TABLET | Freq: Every day | ORAL | Status: DC
  Administered 2017-11-18 – 2017-11-19 (×2): 0.6 mg via ORAL
  Filled 2017-11-18 (×2): qty 1

## 2017-11-18 MED ORDER — INSULIN ASPART 100 UNIT/ML ~~LOC~~ SOLN
0.0000 [IU] | Freq: Four times a day (QID) | SUBCUTANEOUS | Status: DC
  Administered 2017-11-18: 3 [IU] via SUBCUTANEOUS
  Administered 2017-11-18: 2 [IU] via SUBCUTANEOUS
  Administered 2017-11-18: 17:00:00 3 [IU] via SUBCUTANEOUS
  Administered 2017-11-19: 09:00:00 1 [IU] via SUBCUTANEOUS
  Filled 2017-11-18 (×5): qty 1

## 2017-11-18 NOTE — Progress Notes (Addendum)
[place] at [place] NAME: [name] [name]    MR#:  [id number]  DATE OF BIRTH:  [birth year]  Patient still having cough, fever.  Pleasantly confused.  CHIEF COMPLAINT:   Chief Complaint  Patient presents with  . Hyperglycemia    REVIEW OF SYSTEMS:   Review of Systems  Unable to perform ROS: Dementia     DRUG ALLERGIES:   Allergies  Allergen Reactions  . Lisinopril Rash and Other (See Comments)    Hyponatremia  Hyponatremia  Hyponatremia   . Sulfa Antibiotics Other (See Comments)    possible possible Patient doesn't know   . Sulfasalazine     VITALS:  Blood pressure 137/79, pulse (!) 102, temperature 99.5 F (37.5 C), temperature source Oral, resp. rate 18, height 5\' 4"  (1.626 m), weight 40.7 kg (89 lb 11.6 oz), SpO2 95 %.  PHYSICAL EXAMINATION:  GENERAL:  76 y.o.-year-old patient lying in the bed with no acute distress.  EYES: Pupils equal, round, reactive to light and accommodation. No scleral icterus. Extraocular muscles intact.  HEENT: Head atraumatic, normocephalic. Oropharynx and nasopharynx clear.  NECK:  Supple, no jugular venous distention. No thyroid enlargement, no tenderness.  LUNGS: Normal breath sounds bilaterally, no wheezing, rales,rhonchi or crepitation. No use of accessory muscles of respiration.  Mostly clear to auscultation. CARDIOVASCULAR: S1, S2 normal. No murmurs, rubs, or gallops.  ABDOMEN: Soft, nontender, nondistended. Bowel sounds present. No organomegaly or mass.  EXTREMITIES: No pedal edema, cyanosis, or clubbing.  NEUROLOGIC: Unable to do full neurologic exam because of dementia.   PSYCHIATRIC: The patient is awake but not oriented to time place due to dementia. SKIN: No obvious rash, lesion, or ulcer.    LABORATORY PANEL:   CBC Recent Labs  Lab 11/14/17 0102  WBC 13.6*  HGB 11.4*  HCT 33.6*  PLT 224    ------------------------------------------------------------------------------------------------------------------  Chemistries  Recent Labs  Lab 11/14/17 0446  NA 129*  K 4.0  CL 104  CO2 18*  GLUCOSE 184*  BUN 9  CREATININE 0.85  CALCIUM 7.8*   ------------------------------------------------------------------------------------------------------------------  Cardiac Enzymes Recent Labs  Lab 11/13/17 1845  TROPONINI 0.10*   ------------------------------------------------------------------------------------------------------------------  RADIOLOGY:  No results found.  EKG:   Orders placed or performed during the hospital encounter of 07/31/17  . ED EKG  . ED EKG  . EKG 12-Lead  . EKG 12-Lead    ASSESSMENT AND PLAN:   #1. generalized weakness, malaise, weakness and DKA: Initially received insulin drip, IV fluids, admitted to stepdown.  Now anion gap closed.  Patient moved out of ICU.  Continue sliding scale insulin with coverage, has dementia, poor p.o. Intake.  Continue low-dose Lantus, sliding scale insulin coverage.   2.  Expressive aphasia, confusion: MRI of the brain showed no stroke.  Physical therapy consulted for final disposition.  Discussed with patient's family.. 3.  Dementia, confusion: Physical therapy, recommended skilled nursing.  D #4 severe malnutrition in the context of chronic illness: Continue Glucerna. 4 history of breast cancer status post mastectomy, continue tamoxifen. 5.  Hyperlipidemia: Continue statins. #6.  Community-acquired pneumonia: Continue Rocephin, Zithromax, patient has low-grade fever, tachycardia,,continue iv fluids, I was requested., ID consult requested. .Unable to discharge because of persistent fever, cough.  All the records are reviewed and case discussed with Care Management/Social Workerr. Management plans discussed with the patient, family and they are in agreement.  CODE STATUS: Full code, discussed the CODE  STATUS with patient's daughter, she will get in touch  with her father about theCODE STATUS.  TOTAL TIME TAKING CARE OF THIS PATIENT: 35 minutes.   POSSIBLE D/C IN 1-2 DAYS, DEPENDING ON CLINICAL CONDITION.   Epifanio Lesches M.D on 11/18/2017 at 11:21 AM  Between 7am to 6pm - Pager - [phone]  After 6pm go to [email or web address] - password [email or web address] [facility]  Office  [phone]  CC: Primary care physician; Tracie Harrier, MD   Note: This dictation was prepared with Dragon dictation along with smaller phrase technology. Any transcriptional errors that result from this process are unintentional.

## 2017-11-18 NOTE — Progress Notes (Signed)
Inpatient Diabetes Program Recommendations  AACE/ADA: New Consensus Statement on Inpatient Glycemic Control (2015)  Target Ranges:  Prepandial:   less than 140 mg/dL      Peak postprandial:   less than 180 mg/dL (1-2 hours)      Critically ill patients:  140 - 180 mg/dL   Results for EVELINA, LORE (MRN [id number]) as of 11/18/2017 10:26  Ref. Range 11/17/2017 07:39 11/17/2017 12:03 11/17/2017 16:52 11/17/2017 20:59  Glucose-Capillary Latest Ref Range: 65 - 99 mg/dL 231 (H) 328 (H) 317 (H) 289 (H)   Results for MALLORI, ARAQUE (MRN [id number]) as of 11/18/2017 10:26  Ref. Range 11/18/2017 08:02  Glucose-Capillary Latest Ref Range: 65 - 99 mg/dL 233 (H)    Home DM Meds: Lantus 5 units QHS       Humalog 3 units TID       Humalog 1 unit for every 50 mg/dl > target CBG 200 mg/dl  Current Insulin Orders: Lantus 10 units daily      Novolog Sensitive Correction Scale/ SSI (0-9 units) TID AC + HS       MD- Note Lantus increased to 10 units daily today.  Please also consider changing Novolog SSI coverage to Q4 hours since patient having continuous poor PO intake (currently ordered TID AC + HS)     --Will follow patient during hospitalization--  Wyn Quaker RN, MSN, CDE Diabetes Coordinator Inpatient Glycemic Control Team Team Pager: [phone] (8a-5p)

## 2017-11-18 NOTE — Progress Notes (Deleted)
[place] [facility] at [place] NAME: [name] [name]    MR#:  [id number]  DATE OF BIRTH:  1941-10-11  Still has persistent fever, cough.  CHIEF COMPLAINT:   Chief Complaint  Patient presents with  . Hyperglycemia    REVIEW OF SYSTEMS:   Review of Systems  Unable to perform ROS: Dementia     DRUG ALLERGIES:   Allergies  Allergen Reactions  . Lisinopril Rash and Other (See Comments)    Hyponatremia  Hyponatremia  Hyponatremia   . Sulfa Antibiotics Other (See Comments)    possible possible Patient doesn't know   . Sulfasalazine     VITALS:  Blood pressure 137/79, pulse (!) 102, temperature 99.5 F (37.5 C), temperature source Oral, resp. rate 18, height 5\' 4"  (1.626 m), weight 40.7 kg (89 lb 11.6 oz), SpO2 95 %.  PHYSICAL EXAMINATION:  GENERAL:  76 y.o.-year-old patient lying in the bed with no acute distress.  EYES: Pupils equal, round, reactive to light and accommodation. No scleral icterus. Extraocular muscles intact.  HEENT: Head atraumatic, normocephalic. Oropharynx and nasopharynx clear.  NECK:  Supple, no jugular venous distention. No thyroid enlargement, no tenderness.  LUNGS: Normal breath sounds bilaterally, no wheezing, rales,rhonchi or crepitation. No use of accessory muscles of respiration.  Mostly clear to auscultation. CARDIOVASCULAR: S1, S2 normal. No murmurs, rubs, or gallops.  ABDOMEN: Soft, nontender, nondistended. Bowel sounds present. No organomegaly or mass.  EXTREMITIES: No pedal edema, cyanosis, or clubbing.  NEUROLOGIC: Unable to do full neurologic exam because of dementia.   PSYCHIATRIC: The patient is awake but not oriented to time place due to dementia. SKIN: No obvious rash, lesion, or ulcer.    LABORATORY PANEL:   CBC Recent Labs  Lab 11/14/17 0102  WBC 13.6*  HGB 11.4*  HCT 33.6*  PLT 224    ------------------------------------------------------------------------------------------------------------------  Chemistries  Recent Labs  Lab 11/14/17 0446  NA 129*  K 4.0  CL 104  CO2 18*  GLUCOSE 184*  BUN 9  CREATININE 0.85  CALCIUM 7.8*   ------------------------------------------------------------------------------------------------------------------  Cardiac Enzymes Recent Labs  Lab 11/13/17 1845  TROPONINI 0.10*   ------------------------------------------------------------------------------------------------------------------  RADIOLOGY:  No results found.  EKG:   Orders placed or performed during the hospital encounter of 07/31/17  . ED EKG  . ED EKG  . EKG 12-Lead  . EKG 12-Lead    ASSESSMENT AND PLAN:   #1. generalized weakness, malaise, weakness and DKA: Initially received insulin drip, IV fluids, admitted to stepdown.  Now anion gap closed.  Patient moved out of ICU.  Continue sliding scale insulin with coverage, has dementia, poor p.o. Intake.  Continue low-dose Lantus, sliding scale insulin coverage.  Lantus dose adjusted today, 2.  Expressive aphasia, confusion: MRI of the brain showed no stroke.  Physical therapy consulted for final disposition.  Discussed with patient's family.. 3.  Dementia, confusion: Physical therapy, recommended skilled nursing.  Discharge when arrangements are made.  #4. severe malnutrition in the context of chronic illness: Continue Glucerna. 4 history of breast cancer status post mastectomy, continue tamoxifen. 5.  Hyperlipidemia: Continue statins. #6. . Community-acquired pneumonia: Continue Rocephin, Zithromax, patient has low-grade fever, tachycardia,,continue iv fluids, persistent fevers, ID consult requested.  Recheck the UA.  All the records are reviewed and case discussed with Care Management/Social Workerr. Management plans discussed with the patient, family and they are in agreement.  CODE STATUS: Full code,  discussed the CODE STATUS with patient's daughter, she will get in touch  with her father about theCODE STATUS.  TOTAL TIME TAKING CARE OF THIS PATIENT: 35 minutes.   POSSIBLE D/C IN 1-2 DAYS, DEPENDING ON CLINICAL CONDITION.   Epifanio Lesches M.D on 11/18/2017 at 11:17 AM  Between 7am to 6pm - Pager - [phone]  After 6pm go to [email or web address] - password [email or web address] [facility]  Office  [phone]  CC: Primary care physician; Tracie Harrier, MD   Note: This dictation was prepared with Dragon dictation along with smaller phrase technology. Any transcriptional errors that result from this process are unintentional.

## 2017-11-18 NOTE — Consult Note (Signed)
[facility] Infectious Disease     Reason for Consult:Persistent fever  Referring Physician: Governor Specking Date of Admission:  11/13/2017   Principal Problem:   DKA (diabetic ketoacidoses) ([place]) Active Problems:   Malignant neoplasm of breast ([place])   Hyperlipemia   BP (high blood pressure)   Hypothyroidism, unspecified   AKI (acute kidney injury) ([place])   Protein-calorie malnutrition, severe   HPI: [name] [name] is a 76 y.o. female admitted with malaise, weakness and AMS and found to have DKA. On admit had wbc 15 and low grade fevers. Started on treatment for CAP with ctx and azithromycin. WBC decreased to 11 but low grade fevers persist.  UA neg, CXR initially neg but fu with R base infiltrate. 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  She has also since admission developed first L then R wrist pain and swelling. L side has subsided but R remains painful and swollen.  Past Medical History:  Diagnosis Date  . Breast cancer ([place]) 2011   RT MASTECTOMY  . Cancer ([place]) 2011   BREAST CA  . diabetes insulin dep   . High cholesterol   . Hypertension   . Hypothyroidism   . Osteoporosis   . Thyroid disease    Past Surgical History:  Procedure Laterality Date  . BREAST BIOPSY Right 2011   positive  . CATARACT EXTRACTION W/PHACO Right 06/13/2016   Procedure: CATARACT EXTRACTION PHACO AND INTRAOCULAR LENS PLACEMENT (IOC);  Surgeon: Estill Cotta, MD;  Location: [facility] ORS;  Service: Ophthalmology;  Laterality: Right;  [place] 03:07 AP% 27.4 CDE 93.21 fluid pack lot # [id number]  . MASTECTOMY Right 2011   positive  . TONSILLECTOMY    . TOTAL ABDOMINAL HYSTERECTOMY     Social History   Tobacco Use  . Smoking status: Never Smoker  . Smokeless tobacco: Never Used  Substance Use Topics  . Alcohol use: No  . Drug use: No   Family History  Problem Relation Age of Onset  . Heart disease Father        heart attack  . Breast cancer Neg Hx     Allergies:  Allergies  Allergen Reactions  .  Lisinopril Rash and Other (See Comments)    Hyponatremia  Hyponatremia  Hyponatremia   . Sulfa Antibiotics Other (See Comments)    possible possible Patient doesn't know   . Sulfasalazine     Current antibiotics: Antibiotics Given (last 72 hours)    Date/Time Action Medication Dose Rate   11/16/17 0821 New Bag/Given   azithromycin (ZITHROMAX) 500 mg in dextrose 5 % 250 mL IVPB 500 mg 250 mL/hr   11/16/17 1203 New Bag/Given   cefTRIAXone (ROCEPHIN) 1 g in dextrose 5 % 50 mL IVPB 1 g 100 mL/hr   11/17/17 2951 Given   azithromycin (ZITHROMAX) tablet 500 mg 500 mg    11/17/17 1239 New Bag/Given   cefTRIAXone (ROCEPHIN) 1 g in dextrose 5 % 50 mL IVPB 1 g 100 mL/hr   11/18/17 0905 Given   azithromycin (ZITHROMAX) tablet 500 mg 500 mg    11/18/17 1242 New Bag/Given   cefTRIAXone (ROCEPHIN) 1 g in dextrose 5 % 50 mL IVPB 1 g 100 mL/hr      MEDICATIONS: . azithromycin  500 mg Oral Daily  . Difluprednate  1 drop Right Eye QODAY  . docusate sodium  100 mg Oral BID  . enalapril  5 mg Oral Daily  . enoxaparin (LOVENOX) injection  30 mg Subcutaneous Q24H  . feeding supplement (GLUCERNA  SHAKE)  237 mL Oral TID BM  . insulin aspart  0-9 Units Subcutaneous QID  . insulin glargine  10 Units Subcutaneous Daily  . multivitamin with minerals  1 tablet Oral Daily  . pravastatin  20 mg Oral q1800  . sodium chloride flush  3 mL Intravenous Q12H  . tamoxifen  20 mg Oral QHS    Review of Systems - 11 systems reviewed and negative per HPI   OBJECTIVE: Temp:  [98 F (36.7 C)-100.5 F (38.1 C)] 99.5 F (37.5 C) (12/31 0908) Pulse Rate:  [94-114] 102 (12/31 0908) Resp:  [18-24] 18 (12/31 0908) BP: (119-143)/(58-79) 137/79 (12/31 0908) SpO2:  [94 %-96 %] 95 % (12/31 0908) Physical Exam  Constitutional:  Awake and interactive but some disorientation and diff answering questions HENT: [place]/AT, PERRLA, no scleral icterus Mouth/Throat: Oropharynx is clear and moist. No oropharyngeal exudate.   Cardiovascular: Normal rate, regular rhythm and normal heart sounds. Pulmonary/Chest:bil Rhonchi bases Neck = supple, no nuchal rigidity Abdominal: Soft. Bowel sounds are normal.  exhibits no distension. There is no tenderness.  Lymphadenopathy: no cervical adenopathy. No axillary adenopathy Ext R wrist with swelling and warmth, Pain with ROM at wrist Neurological: alert and oriented to person, place, and time.  Skin: Skin is warm and dry. No rash noted. No erythema.  Psychiatric: a normal mood and affect.  behavior is normal.   LABS: Results for orders placed or performed during the hospital encounter of 11/13/17 (from the past 48 hour(s))  Glucose, capillary     Status: Abnormal   Collection Time: 11/16/17  4:34 PM  Result Value Ref Range   Glucose-Capillary 318 (H) 65 - 99 mg/dL  Glucose, capillary     Status: Abnormal   Collection Time: 11/16/17  9:09 PM  Result Value Ref Range   Glucose-Capillary 324 (H) 65 - 99 mg/dL  Glucose, capillary     Status: Abnormal   Collection Time: 11/17/17  7:39 AM  Result Value Ref Range   Glucose-Capillary 231 (H) 65 - 99 mg/dL  Glucose, capillary     Status: Abnormal   Collection Time: 11/17/17 12:03 PM  Result Value Ref Range   Glucose-Capillary 328 (H) 65 - 99 mg/dL  Glucose, capillary     Status: Abnormal   Collection Time: 11/17/17  4:52 PM  Result Value Ref Range   Glucose-Capillary 317 (H) 65 - 99 mg/dL  Glucose, capillary     Status: Abnormal   Collection Time: 11/17/17  8:59 PM  Result Value Ref Range   Glucose-Capillary 289 (H) 65 - 99 mg/dL  Glucose, capillary     Status: Abnormal   Collection Time: 11/18/17  8:02 AM  Result Value Ref Range   Glucose-Capillary 233 (H) 65 - 99 mg/dL  CBC     Status: Abnormal   Collection Time: 11/18/17 11:37 AM  Result Value Ref Range   WBC 11.4 (H) 3.6 - 11.0 K/uL   RBC 3.41 (L) 3.80 - 5.20 MIL/uL   Hemoglobin 11.1 (L) 12.0 - 16.0 g/dL   HCT 32.7 (L) 35.0 - 47.0 %   MCV 95.9 80.0 - 100.0  fL   MCH 32.6 26.0 - 34.0 pg   MCHC 34.0 32.0 - 36.0 g/dL   RDW 13.7 11.5 - 14.5 %   Platelets 368 150 - 440 K/uL    Comment: Performed at [facility], [address] [place], [place] [address]  Basic metabolic panel     Status: Abnormal   Collection Time: 11/18/17  11:37 AM  Result Value Ref Range   Sodium 133 (L) 135 - 145 mmol/L   Potassium 3.8 3.5 - 5.1 mmol/L   Chloride 102 101 - 111 mmol/L   CO2 22 22 - 32 mmol/L   Glucose, Bld 227 (H) 65 - 99 mg/dL   BUN 10 6 - 20 mg/dL   Creatinine, Ser 0.68 0.44 - 1.00 mg/dL   Calcium 7.4 (L) 8.9 - 10.3 mg/dL   GFR calc non Af Amer >60 >60 mL/min   GFR calc Af Amer >60 >60 mL/min    Comment: (NOTE) The eGFR has been calculated using the CKD EPI equation. This calculation has not been validated in all clinical situations. eGFR's persistently <60 mL/min signify possible Chronic Kidney Disease.    Anion gap 9 5 - 15    Comment: Performed at [facility], [address] [place], [place] [address]  Glucose, capillary     Status: Abnormal   Collection Time: 11/18/17 11:47 AM  Result Value Ref Range   Glucose-Capillary 206 (H) 65 - 99 mg/dL   No components found for: ESR, C REACTIVE PROTEIN MICRO: Recent Results (from the past 720 hour(s))  MRSA PCR Screening     Status: None   Collection Time: 11/14/17 12:54 AM  Result Value Ref Range Status   MRSA by PCR NEGATIVE NEGATIVE Final    Comment:        The GeneXpert MRSA Assay (FDA approved for NASAL specimens only), is one component of a comprehensive MRSA colonization surveillance program. It is not intended to diagnose MRSA infection nor to guide or monitor treatment for MRSA infections. Performed at [facility], [address] [place],  [address]     IMAGING: Dg Chest 1 View  Result Date: 11/15/2017 CLINICAL DATA:  Cough. EXAM: CHEST 1 VIEW COMPARISON:  11/13/2017. FINDINGS: Mediastinum hilar structures normal. Left lung is clear.  Right base infiltrate consistent with pneumonia. Small right pleural effusion. Old right rib fractures. No pneumothorax. IMPRESSION: Right base infiltrate consistent with pneumonia. Electronically Signed   By: Marcello Moores  Register   On: 11/15/2017 10:51   Dg Chest 2 View  Result Date: 11/13/2017 CLINICAL DATA:  Cough and dyspnea with high blood sugar EXAM: CHEST  2 VIEW COMPARISON:  06/08/2009 FINDINGS: Hyperinflation. No focal pulmonary infiltrate or effusion. Cardiomediastinal silhouette within normal limits with aortic atherosclerosis. No pneumothorax. Multiple old right-sided rib fractures. IMPRESSION: Hyperinflation without focal infiltrate or edema. Electronically Signed   By: Donavan Foil M.D.   On: 11/13/2017 21:06   Mr Brain Wo Contrast  Result Date: 11/14/2017 CLINICAL DATA:  Progressive weakness for 1 month, intermittent lethargy. DKA. Recent expressive aphasia. History of breast cancer, diabetes, hypertension, hypercholesterolemia. EXAM: MRI HEAD WITHOUT CONTRAST TECHNIQUE: Multiplanar, multiecho pulse sequences of the brain and surrounding structures were obtained without intravenous contrast. COMPARISON:  CT HEAD June 18, 2016 FINDINGS: Mildly motion degraded examination. BRAIN: No reduced diffusion to suggest acute ischemia. No susceptibility artifact to suggest hemorrhage. Moderate parenchymal brain volume loss, slightly greater than expected for age without hydrocephalus. Patchy to confluent supratentorial white matter FLAIR T2 hyperintensities. Old small RIGHT cerebellar infarct. No suspicious parenchymal signal, mass or mass effect. No abnormal extra-axial fluid collections. VASCULAR: Normal major intracranial vascular flow voids present at skull base. SKULL AND UPPER CERVICAL SPINE: No abnormal sellar expansion. No suspicious calvarial bone marrow signal. Severe degenerative disc disease and possible grade 1 C3-4 anterolisthesis included cervical spine. Craniocervical junction maintained.  SINUSES/ORBITS: Trace RIGHT mastoid  effusion. Paranasal sinuses are well-aerated. The included ocular globes and orbital contents are non-suspicious. Status post RIGHT ocular lens implant. OTHER: None. IMPRESSION: 1. No acute intracranial process on this motion degraded examination. 2. Moderate parenchymal brain volume loss. 3. Moderate chronic small vessel ischemic disease. Old small RIGHT cerebellar infarct. Electronically Signed   By: Elon Alas M.D.   On: 11/14/2017 13:39    Assessment:   [name] [name] is a 76 y.o. female admitted with with malaise, weakness and AMS and found to have DKA. On admit had wbc 15 and low grade fevers. Started on treatment for CAP with ctx and azithromycin. WBC decreased to 11 but low grade fevers persist.  UA neg, CXR initially neg but fu with R base infiltrate. 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  Has also developed Wrist swelling, warmth and pain.  I suspect she has acute pseudogout of wrist which could be contributing to her low grade fevers. May have some aspiration as well given development of RLL infiltrate and initial AMS but clinically improving.  Recommendations Day 4 of azithro 500 mg - can dc azithromycin Cont ceftriaxone but when ready for dc can transition to oral cefpodoxime 200 bid for a total 7 days abx coverage - stop date 11/21/16  Wrist acute arthritis - suspect gout or pseudogout precipitated by acute illness. 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 Discussed with patient and daughter consideration of aspiration by rheum or ortho but they decline and will treat empirically. Very low likelihood of septic joint.  I have started colchicine for pseudogout and would continue for 5 days total.  If improving can dc to SNF when stable.  Thank you very much for allowing me to participate in the care of this patient. Please call with questions.   Cheral Marker. Ola Spurr, MD

## 2017-11-18 NOTE — Clinical Social Work Note (Signed)
CSW spoke with patient and her family to present bed offers, and they chose [facility].  CSW spoke to [facility] and they can accept her once patient is medically ready for discharge.  CSW to continue to follow patient's progress throughout discharge planning.  Jones Broom. [place], MSW, [place]  11/18/2017 2:58 PM

## 2017-11-18 NOTE — Clinical Social Work Note (Signed)
Clinical Social Work Assessment  Patient Details  Name: [name] [name] MRN: [id number] Date of Birth: 13-Apr-1941  Date of referral:  11/18/17               Reason for consult:  Facility Placement                Permission sought to share information with:  Family Supports Permission granted to share information::  Yes, Verbal Permission Granted  Name::     [name] [name] Spouse [phone]  [phone] or [name] [name] Daughter   [phone]   Agency::  SNF admissions  Relationship::     Contact Information:     Housing/Transportation Living arrangements for the past 2 months:  Single Family Home Source of Information:  Patient, Adult Children, Spouse Patient Interpreter Needed:  None Criminal Activity/Legal Involvement Pertinent to Current Situation/Hospitalization:  No - Comment as needed Significant Relationships:  Adult Children, Other Family Members, Spouse Lives with:  Spouse Do you feel safe going back to the place where you live?  No Need for family participation in patient care:  Yes (Comment)  Care giving concerns:  Patient and family would like to go to rehab facility before she is able to return back home.   Social Worker assessment / plan:  Patient is a 76 year old female who is married and lives with her husband.  Patient is alert and oriented x3, patient's daughter, and husband were at bedside.  CSW explained role and process for looking for SNF for short term rehab.  CSW explained how insurance will pay for stay at SNF, and what to expect.  CSW explained how the hospital will discharge patient to SNF.  CSW explained to patient and her family the goal is short term rehab in order to return back home.  Patient and her family did not have any other questions and gave CSW permission to begin bed search in [place].  Employment status:  Retired Nurse, adult PT Recommendations:  [place] / Referral to community  resources:  [place]  Patient/Family's Response to care:  Patient and family agreeable to going to SNF for short term rehab.  Patient/Family's Understanding of and Emotional Response to Diagnosis, Current Treatment, and Prognosis:  Patient and family are hopeful that she will not have to be in rehab for very long.  Emotional Assessment Appearance:  Appears stated age Attitude/Demeanor/Rapport:    Affect (typically observed):  Accepting, Appropriate, Calm, Stable Orientation:  Oriented to Self, Oriented to Place, Oriented to Situation Alcohol / Substance use:  Not Applicable Psych involvement (Current and /or in the community):  No (Comment)  Discharge Needs  Concerns to be addressed:  Lack of Support Readmission within the last 30 days:  No Current discharge risk:  Lack of support system, Cognitively Impaired Barriers to Discharge:  Continued Medical Work up   Anell Barr 11/18/2017, 6:07 PM

## 2017-11-18 NOTE — Progress Notes (Signed)
Physical Therapy Treatment Patient Details Name: [name] [name] MRN: [id number] DOB: 1941-03-31 Today's Date: 11/18/2017    History of Present Illness 76 yo Female came to ED with diabetic ketoacidosis. Patient was at home and not feeling well. Her family reports that she has been getting weak and fatigued over last month.  Her family states that she was mixing up her words and slurred speec with expressive aphasia. MRI was negative for acute abnormality; She was just diagnosed with pneumonia earlier today;     PT Comments    Pt presents with deficits in strength, transfers, mobility, gait, balance, and activity tolerance but improved from prior PT session.  Pt required mod A with cues for bed mobility tasks and min-mod A with transfers from EOB.  Pt required frequent assistance in sitting at EOB to prevent posterior LOB but improved grossly during session with anterior weight shifting activities.  Pt also presented with posterior instability in standing but improved with anterior weight shifting with pt reaching forward to hold countertop.  While slightly leaning forward on countertop pt able to take several small forward, backward, and sideways steps with min A for stability.  Pt will benefit from PT services in a SNF setting upon discharge to safely address above deficits for decreased caregiver assistance and eventual return to PLOF.       Follow Up Recommendations  SNF;Supervision/Assistance - 24 hour     Equipment Recommendations  (TBD at next venue of care)    Recommendations for Other Services       Precautions / Restrictions Precautions Precautions: Fall Restrictions Weight Bearing Restrictions: No    Mobility  Bed Mobility Overal bed mobility: Needs Assistance Bed Mobility: Sit to Supine;Supine to Sit     Supine to sit: Mod assist Sit to supine: Mod assist   General bed mobility comments: Extensive verbal and tactile cues required for sequencing  Transfers Overall  transfer level: Needs assistance Equipment used: 1 person hand held assist Transfers: Sit to/from Stand Sit to Stand: Min assist;Mod assist         General transfer comment: Upon standing pt required frequent min-mod A to prevent posterior LOB  Ambulation/Gait Ambulation/Gait assistance: Min assist Ambulation Distance (Feet): 4 Feet Assistive device: (BUE support on counter) Gait Pattern/deviations: Step-to pattern;Trunk flexed   Gait velocity interpretation: <1.8 ft/sec, indicative of risk for recurrent falls General Gait Details: Min A for stability during amb   Stairs            Wheelchair Mobility    Modified Rankin (Stroke Patients Only)       Balance Overall balance assessment: Needs assistance Sitting-balance support: Feet supported;Single extremity supported;Feet unsupported Sitting balance-Leahy Scale: Poor Sitting balance - Comments: Frequent min-mod A to prevent LOB while sitting at EOB Postural control: Posterior lean;Left lateral lean Standing balance support: Bilateral upper extremity supported Standing balance-Leahy Scale: Poor Standing balance comment: Frequent min-mod A to prevent posterior LOB                            Cognition Arousal/Alertness: Awake/alert Behavior During Therapy: WFL for tasks assessed/performed Overall Cognitive Status: No family/caregiver present to determine baseline cognitive functioning                                        Exercises Total Joint Exercises Ankle Circles/Pumps: AROM;Both;10 reps(Verbal and tactile  cues required during all therex for technique) Quad Sets: Strengthening;Both;10 reps Heel Slides: AAROM;Both;5 reps Hip ABduction/ADduction: AAROM;Both;10 reps Straight Leg Raises: AAROM;Both;10 reps Long Arc Quad: AROM;Both;5 reps;10 reps Knee Flexion: AROM;Both;5 reps;10 reps Marching in Standing: AROM;Both;5 reps Other Exercises Other Exercises: Seated core and balance  training with focus on increased anterior weight shifting to prevent posterior LOB    General Comments        Pertinent Vitals/Pain Pain Assessment: No/denies pain    Home Living                      Prior Function            PT Goals (current goals can now be found in the care plan section) Progress towards PT goals: Progressing toward goals    Frequency    Min 2X/week      PT Plan Current plan remains appropriate    Co-evaluation              AM-PAC PT "6 Clicks" Daily Activity  Outcome Measure                   End of Session Equipment Utilized During Treatment: Gait belt Activity Tolerance: Patient tolerated treatment well Patient left: in bed;with bed alarm set;with call bell/phone within reach;with nursing/sitter in room Nurse Communication: Mobility status PT Visit Diagnosis: Unsteadiness on feet (R26.81);Muscle weakness (generalized) (M62.81)     Time: 4967-5916 PT Time Calculation (min) (ACUTE ONLY): 24 min  Charges:  $Therapeutic Exercise: 8-22 mins $Therapeutic Activity: 8-22 mins                    G Codes:       DRoyetta Asal PT, DPT 11/18/17, 5:13 PM

## 2017-11-18 NOTE — Clinical Social Work Placement (Signed)
   CLINICAL SOCIAL WORK PLACEMENT  NOTE  Date:  11/18/2017  Patient Details  Name: MCKENLEY BIRENBAUM MRN: [id number] Date of Birth: 1941-01-22  Clinical Social Work is seeking post-discharge placement for this patient at the [place] level of care (*CSW will initial, date and re-position this form in  chart as items are completed):  Yes   Patient/family provided with [facility] Work Department's list of facilities offering this level of care within the geographic area requested by the patient (or if unable, by the patient's family).  Yes   Patient/family informed of their freedom to choose among providers that offer the needed level of care, that participate in Medicare, Medicaid or managed care program needed by the patient, have an available bed and are willing to accept the patient.  Yes   Patient/family informed of [facility]'s ownership interest in [facility] and [facility], as well as of the fact that they are under no obligation to receive care at these facilities.  PASRR submitted to EDS on 11/17/17     PASRR number received on 11/17/17     Existing PASRR number confirmed on       FL2 transmitted to all facilities in geographic area requested by pt/family on 11/17/17     FL2 transmitted to all facilities within larger geographic area on       Patient informed that his/her managed care company has contracts with or will negotiate with certain facilities, including the following:        Yes   Patient/family informed of bed offers received.  Patient [facility] bed at [facility]     Physician recommends and patient chooses bed at      Patient to be transferred to [facility] on  .  Patient to be transferred to facility by       Patient family notified on   of transfer.  Name of family member notified:        PHYSICIAN Please sign FL2     Additional Comment:     _______________________________________________ Ross Ludwig, LCSWA 11/18/2017, 6:21 PM

## 2017-11-19 LAB — GLUCOSE, CAPILLARY
GLUCOSE-CAPILLARY: 144 mg/dL — AB (ref 65–99)
Glucose-Capillary: 170 mg/dL — ABNORMAL HIGH (ref 65–99)

## 2017-11-19 MED ORDER — CEFPODOXIME PROXETIL 200 MG PO TABS
200.0000 mg | ORAL_TABLET | Freq: Two times a day (BID) | ORAL | Status: DC
  Administered 2017-11-19: 200 mg via ORAL
  Filled 2017-11-19 (×2): qty 1

## 2017-11-19 MED ORDER — COLCHICINE 0.6 MG PO TABS: 0.6000 mg | ORAL_TABLET | Freq: Every day | ORAL | 0 refills | Status: DC

## 2017-11-19 MED ORDER — CEFPODOXIME PROXETIL 200 MG PO TABS: 200.0000 mg | ORAL_TABLET | Freq: Two times a day (BID) | ORAL | 0 refills | Status: AC

## 2017-11-19 MED ORDER — INSULIN GLARGINE 100 UNIT/ML ~~LOC~~ SOLN: 10.0000 [IU] | Freq: Every day | SUBCUTANEOUS | 11 refills | Status: DC

## 2017-11-19 MED ORDER — INSULIN ASPART 100 UNIT/ML ~~LOC~~ SOLN: 0.0000 [IU] | Freq: Four times a day (QID) | SUBCUTANEOUS | 11 refills | Status: DC

## 2017-11-19 MED ORDER — CEFPODOXIME PROXETIL 200 MG PO TABS: 200.0000 mg | ORAL_TABLET | Freq: Two times a day (BID) | ORAL | 0 refills | Status: DC

## 2017-11-19 MED ORDER — DOCUSATE SODIUM 100 MG PO CAPS: 100.0000 mg | ORAL_CAPSULE | Freq: Two times a day (BID) | ORAL | 0 refills | Status: DC

## 2017-11-19 NOTE — Clinical Social Work Note (Signed)
Patient to be d/c'ed today to [facility] room 507.  Patient and family agreeable to plans will transport via family vehicle RN to call report to [phone].  Evette Cristal, MSW, [place]

## 2017-11-19 NOTE — Discharge Summary (Signed)
[name] [name], is a 77 y.o. female  DOB 09-Mar-1941  MRN [id number].  Admission date:  11/13/2017  Admitting Physician  Lance Coon, MD  Discharge Date:  11/19/2017   Primary MD  Tracie Harrier, MD  Recommendations for primary care physician for things to follow:   Follow-up with PCP in 1 week   Admission Diagnosis  Diabetic ketoacidosis without coma associated with type 2 diabetes mellitus ([place]) [E11.10]   Discharge Diagnosis  Diabetic ketoacidosis without coma associated with type 2 diabetes mellitus ([place]) [E11.10]    Principal Problem:   DKA (diabetic ketoacidoses) ([place]) Active Problems:   Malignant neoplasm of breast ([place])   Hyperlipemia   BP (high blood pressure)   Hypothyroidism, unspecified   AKI (acute kidney injury) ([place])   Protein-calorie malnutrition, severe      Past Medical History:  Diagnosis Date  . Breast cancer ([place]) 2011   RT MASTECTOMY  . Cancer ([place]) 2011   BREAST CA  . diabetes insulin dep   . High cholesterol   . Hypertension   . Hypothyroidism   . Osteoporosis   . Thyroid disease     Past Surgical History:  Procedure Laterality Date  . BREAST BIOPSY Right 2011   positive  . CATARACT EXTRACTION W/PHACO Right 06/13/2016   Procedure: CATARACT EXTRACTION PHACO AND INTRAOCULAR LENS PLACEMENT (IOC);  Surgeon: Estill Cotta, MD;  Location: [facility] ORS;  Service: Ophthalmology;  Laterality: Right;  [place] 03:07 AP% 27.4 CDE 93.21 fluid pack lot # [id number]  . MASTECTOMY Right 2011   positive  . TONSILLECTOMY    . TOTAL ABDOMINAL HYSTERECTOMY         History of present illness and  Hospital Course:     Kindly see H&P for history of present illness and admission details, please review complete Labs, Consult reports and Test reports for all details in brief  HPI  from the history  and physical done on the day of admission 77 year old female patient admitted for malaise, weakness, altered mental status found to have DKA.  Admitted to stepdown and started on insulin drip.  Later on patient developed fever, cough chest x-ray showed pneumonia.   Hospital Course  1 DKA present on admission: Resolved with insulin drip, IV fluids.  Patient moved out of ICU, started on sliding scale insulin with coverage.  Seen by diabetic nurse.  Patient p.o. intake is still not stable.  So she is on Lantus 10 units daily, NovoLog to scale insulin coverage every 4 hours because patient continues to have poor p.o. intake because of her dementia.  Anion gap on admission 18, decreased to 9  . 3.  Hyponatremia due to DKA: Improved with IV hydration, sodium 124 admission, improved to 133.   4.  Community-acquired pneumonia in the right lung.  Patient can started to have cough, fever and, started on Rocephin, Zithromax.  Patient continued to have low-grade temperature, white count elevated up to 15.6 but now it is 11.4.  No further fever, seen by Dr. Ola Spurr from ID because of persistent fever despite IV antibiotics for pneumonia.  Shunt has wrist pain both in the left and right because of osteoarthritis, started on colchicine, general recommended transition to oral Vantin 200 mg twice daily for 7 days and stop date for antibiotic is 11/21/16.  Patient finished 5 days of azithromycin. Patient can get colchicine for 5 days for pseudogout.  And patient has no further fever.  And she is stable for discharge to [facility]  today.  #5 .altered mental status: Patient had dementia but  had MRI of the brain just to make sure she had no stroke.  MRI of the brain did not show any stroke.  Discharge Condition: Stable   Follow UP  Contact information for after-discharge care    [facility] .   Service:  Skilled Nursing Contact information: [place] [phone]                Discharge Instructions  and  Discharge Medications      Allergies as of 11/19/2017      Reactions   Lisinopril Rash, Other (See Comments)   Hyponatremia  Hyponatremia  Hyponatremia    Sulfa Antibiotics Other (See Comments)   possible possible Patient doesn't know    Sulfasalazine       Medication List    STOP taking these medications   insulin lispro 100 UNIT/ML injection Commonly known as:  HUMALOG     TAKE these medications   brimonidine 0.2 % ophthalmic solution Commonly known as:  ALPHAGAN Place 1 drop into the right eye 3 (three) times daily.   cefpodoxime 200 MG tablet Commonly known as:  VANTIN Take 1 tablet (200 mg total) by mouth every 12 (twelve) hours for 2 days.   colchicine 0.6 MG tablet Take 1 tablet (0.6 mg total) by mouth daily.   denosumab 60 MG/ML Soln injection Commonly known as:  PROLIA Inject 60 mg into the skin every 6 (six) months.   docusate sodium 100 MG capsule Commonly known as:  COLACE Take 1 capsule (100 mg total) by mouth 2 (two) times daily.   dorzolamide-timolol 22.3-6.8 MG/ML ophthalmic solution Commonly known as:  COSOPT Place 1 drop into the right eye 2 (two) times daily.   DUREZOL 0.05 % Emul Generic drug:  Difluprednate Place 1 drop into the right eye every other day.   enalapril 5 MG tablet Commonly known as:  VASOTEC Take 1 tablet (5 mg total) by mouth daily.   feeding supplement (GLUCERNA SHAKE) Liqd Take 237 mLs by mouth 2 (two) times daily at 8 am and 10 pm.   insulin aspart 100 UNIT/ML injection Commonly known as:  novoLOG Inject 0-9 Units into the skin 4 (four) times daily.   insulin glargine 100 UNIT/ML injection Commonly known as:  LANTUS Inject 0.1 mLs (10 Units total) into the skin daily. What changed:    how much to take  when to take this   lovastatin 20 MG tablet Commonly known as:  MEVACOR Take 20 mg by mouth at  bedtime.   moxifloxacin 0.5 % ophthalmic solution Commonly known as:  VIGAMOX Place 1 drop into the right eye 4 (four) times daily.   tamoxifen 20 MG tablet Commonly known as:  NOLVADEX TAKE 1 TABLET DAILY What changed:    how much to take  how to take this  when to take this         Diet and Activity recommendation: See Discharge Instructions above   Consults obtained -ID   Major procedures and Radiology Reports - PLEASE review detailed and final reports for all details, in brief -     Dg Chest 1 View  Result Date: 11/15/2017 CLINICAL DATA:  Cough. EXAM: CHEST 1 VIEW COMPARISON:  11/13/2017. FINDINGS: Mediastinum hilar structures normal. Left lung is clear. Right base infiltrate consistent with pneumonia. Small right pleural effusion. Old right rib fractures. No pneumothorax.  IMPRESSION: Right base infiltrate consistent with pneumonia. Electronically Signed   By: Marcello Moores  Register   On: 11/15/2017 10:51   Dg Chest 2 View  Result Date: 11/13/2017 CLINICAL DATA:  Cough and dyspnea with high blood sugar EXAM: CHEST  2 VIEW COMPARISON:  06/08/2009 FINDINGS: Hyperinflation. No focal pulmonary infiltrate or effusion. Cardiomediastinal silhouette within normal limits with aortic atherosclerosis. No pneumothorax. Multiple old right-sided rib fractures. IMPRESSION: Hyperinflation without focal infiltrate or edema. Electronically Signed   By: Donavan Foil M.D.   On: 11/13/2017 21:06   Dg Wrist Complete Right  Result Date: 11/18/2017 CLINICAL DATA:  Pain and swelling of the right wrist for 3-4 days. EXAM: RIGHT WRIST - COMPLETE 3+ VIEW COMPARISON:  None. FINDINGS: There is slight ulnar positive variance with small subchondral cysts of the adjacent lunate. Findings are suspicious for changes of ulnar impaction. There is chondrocalcinosis of the triangular fibrocartilage complex. Osteoarthritic sclerosis, joint space narrowing and degenerative subchondral cystic change is noted about  the triscaphe articulation of the wrist with subchondral cysts and/or erosions more evident involving the distal pole of the scaphoid. No acute fracture. Soft tissue swelling is seen about the included distal forearm, wrist and hand. Vascular calcifications are identified along the ulnar artery. IMPRESSION: 1. 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 2. Osteoarthritis of the triscaphe joint. 3. Generalized soft tissue swelling of the distal forearm, wrist and hand. Electronically Signed   By: Ashley Royalty M.D.   On: 11/18/2017 15:56   Mr Brain Wo Contrast  Result Date: 11/14/2017 CLINICAL DATA:  Progressive weakness for 1 month, intermittent lethargy. DKA. Recent expressive aphasia. History of breast cancer, diabetes, hypertension, hypercholesterolemia. EXAM: MRI HEAD WITHOUT CONTRAST TECHNIQUE: Multiplanar, multiecho pulse sequences of the brain and surrounding structures were obtained without intravenous contrast. COMPARISON:  CT HEAD June 18, 2016 FINDINGS: Mildly motion degraded examination. BRAIN: No reduced diffusion to suggest acute ischemia. No susceptibility artifact to suggest hemorrhage. Moderate parenchymal brain volume loss, slightly greater than expected for age without hydrocephalus. Patchy to confluent supratentorial white matter FLAIR T2 hyperintensities. Old small RIGHT cerebellar infarct. No suspicious parenchymal signal, mass or mass effect. No abnormal extra-axial fluid collections. VASCULAR: Normal major intracranial vascular flow voids present at skull base. SKULL AND UPPER CERVICAL SPINE: No abnormal sellar expansion. No suspicious calvarial bone marrow signal. Severe degenerative disc disease and possible grade 1 C3-4 anterolisthesis included cervical spine. Craniocervical junction maintained. SINUSES/ORBITS: Trace RIGHT mastoid effusion. Paranasal sinuses are well-aerated. The included  ocular globes and orbital contents are non-suspicious. Status post RIGHT ocular lens implant. OTHER: None. IMPRESSION: 1. No acute intracranial process on this motion degraded examination. 2. Moderate parenchymal brain volume loss. 3. Moderate chronic small vessel ischemic disease. Old small RIGHT cerebellar infarct. Electronically Signed   By: Elon Alas M.D.   On: 11/14/2017 13:39    Micro Results     Recent Results (from the past 240 hour(s))  MRSA PCR Screening     Status: None   Collection Time: 11/14/17 12:54 AM  Result Value Ref Range Status   MRSA by PCR NEGATIVE NEGATIVE Final    Comment:        The GeneXpert MRSA Assay (FDA approved for NASAL specimens only), is one component of a comprehensive MRSA colonization surveillance program. It is not intended to diagnose MRSA infection nor to guide or monitor treatment for MRSA infections. Performed at [facility], [phone]  [address] [place], [place] [address]        Today   Subjective:   Dejai Schubach today has no headache,no chest abdominal pain for discharge to [facility].  Objective:   Blood pressure 131/63, pulse 97, temperature (!) 97.5 F (36.4 C), temperature source Oral, resp. rate (!) 21, height 5\' 4"  (1.626 m), weight 40.7 kg (89 lb 11.6 oz), SpO2 94 %.   Intake/Output Summary (Last 24 hours) at 11/19/2017 0823 Last data filed at 11/18/2017 1242 Gross per 24 hour  Intake 1178 ml  Output -  Net 1178 ml    Exam Awake, has baseline dementia and sometimes she has alertness, No new F.N deficits, Normal affect [place].AT,PERRAL Supple Neck,No JVD, No cervical lymphadenopathy appriciated.  Symmetrical Chest wall movement, Good air movement bilaterally, CTAB RRR,No Gallops,Rubs or new Murmurs, No Parasternal Heave +ve B.Sounds, Abd Soft, Non tender, No organomegaly appriciated, No rebound -guarding or rigidity. No Cyanosis, Clubbing or edema, No new Rash or bruise  Data Review   CBC w Diff:   Lab Results  Component Value Date   WBC 11.4 (H) 11/18/2017   HGB 11.1 (L) 11/18/2017   HGB 12.7 09/28/2014   HCT 32.7 (L) 11/18/2017   HCT 38.4 09/28/2014   PLT 368 11/18/2017   PLT 232 09/28/2014   LYMPHOPCT 29 08/16/2017   LYMPHOPCT 23.6 09/28/2014   MONOPCT 10 08/16/2017   MONOPCT 10.5 09/28/2014   EOSPCT 1 08/16/2017   EOSPCT 2.3 09/28/2014   BASOPCT 1 08/16/2017   BASOPCT 0.5 09/28/2014    CMP:  Lab Results  Component Value Date   NA 133 (L) 11/18/2017   NA 133 (L) 09/28/2014   K 3.8 11/18/2017   K 4.4 09/28/2014   CL 102 11/18/2017   CL 96 (L) 09/28/2014   CO2 22 11/18/2017   CO2 29 09/28/2014   BUN 10 11/18/2017   BUN 10 09/28/2014   CREATININE 0.68 11/18/2017   CREATININE 0.84 09/28/2014   PROT 6.5 08/16/2017   PROT 6.8 09/28/2014   ALBUMIN 3.9 08/16/2017   ALBUMIN 3.8 09/28/2014   BILITOT 0.7 08/16/2017   BILITOT 0.4 09/28/2014   ALKPHOS 33 (L) 08/16/2017   ALKPHOS 41 (L) 09/28/2014   AST 25 08/16/2017   AST 24 09/28/2014   ALT 9 (L) 08/16/2017   ALT 16 09/28/2014  .   Total Time in preparing paper work, data evaluation and todays exam - 35 minutes  Epifanio Lesches M.D on 11/19/2017 at 8:23 AM    Note: This dictation was prepared with Dragon dictation along with smaller phrase technology. Any transcriptional errors that result from this process are unintentional.

## 2017-11-19 NOTE — Care Management Important Message (Signed)
Important Message  Patient Details  Name: [name] [name] MRN: [id number] Date of Birth: 07-27-1941   Medicare Important Message Given:  Yes Signed IM notice given   Katrina Stack, RN 11/19/2017, 8:43 AM

## 2017-11-19 NOTE — Clinical Social Work Placement (Signed)
   CLINICAL SOCIAL WORK PLACEMENT  NOTE  Date:  11/19/2017  Patient Details  Name: [name] [name] MRN: [id number] Date of Birth: 01/02/1941  Clinical Social Work is seeking post-discharge placement for this patient at the [place] level of care (*CSW will initial, date and re-position this form in  chart as items are completed):  Yes   Patient/family provided with [place] Work Department's list of facilities offering this level of care within the geographic area requested by the patient (or if unable, by the patient's family).  Yes   Patient/family informed of their freedom to choose among providers that offer the needed level of care, that participate in Medicare, Medicaid or managed care program needed by the patient, have an available bed and are willing to accept the patient.  Yes   Patient/family informed of [employer]'s ownership interest in [facility] and [facility], as well as of the fact that they are under no obligation to receive care at these facilities.  PASRR submitted to EDS on 11/17/17     PASRR number received on 11/17/17     Existing PASRR number confirmed on       FL2 transmitted to all facilities in geographic area requested by pt/family on 11/17/17     FL2 transmitted to all facilities within larger geographic area on       Patient informed that his/her managed care company has contracts with or will negotiate with certain facilities, including the following:        Yes   Patient/family informed of bed offers received.  Patient chooses bed at [facility]     Physician recommends and patient chooses bed at      Patient to be transferred to [facility] on 11/19/17.  Patient to be transferred to facility by [facility]     Patient family notified on 11/19/17 of transfer.  Name of family member notified:  Patient's daughter Karna Christmas was at bedside.     PHYSICIAN Please sign FL2      Additional Comment:    _______________________________________________ Ross Ludwig, LCSWA 11/19/2017, 1:31 PM

## 2017-11-19 NOTE — Progress Notes (Signed)
Pt being discharged to [facility], family at bedside to transport pt, discharge instructions reviewed with pt and family, report called to [facility] Levada Dy

## 2017-11-22 DIAGNOSIS — G9340 Encephalopathy, unspecified: Secondary | ICD-10-CM | POA: Insufficient documentation

## 2017-11-22 DIAGNOSIS — M1120 Other chondrocalcinosis, unspecified site: Secondary | ICD-10-CM | POA: Insufficient documentation

## 2017-11-22 DIAGNOSIS — J189 Pneumonia, unspecified organism: Secondary | ICD-10-CM | POA: Insufficient documentation

## 2018-02-03 ENCOUNTER — Ambulatory Visit: Payer: Medicare Other | Admitting: Podiatry

## 2018-02-03 ENCOUNTER — Encounter: Payer: Self-pay | Admitting: Podiatry

## 2018-02-03 ENCOUNTER — Ambulatory Visit (INDEPENDENT_AMBULATORY_CARE_PROVIDER_SITE_OTHER): Payer: Medicare Other | Admitting: Podiatry

## 2018-02-03 DIAGNOSIS — B351 Tinea unguium: Secondary | ICD-10-CM | POA: Diagnosis not present

## 2018-02-03 DIAGNOSIS — M79674 Pain in right toe(s): Secondary | ICD-10-CM | POA: Diagnosis not present

## 2018-02-03 DIAGNOSIS — M79675 Pain in left toe(s): Secondary | ICD-10-CM

## 2018-02-03 DIAGNOSIS — E109 Type 1 diabetes mellitus without complications: Secondary | ICD-10-CM

## 2018-02-03 DIAGNOSIS — L851 Acquired keratosis [keratoderma] palmaris et plantaris: Secondary | ICD-10-CM | POA: Diagnosis not present

## 2018-02-03 NOTE — Progress Notes (Signed)
Complaint:  Visit Type: Patient returns to my office for continued preventative foot care services. Complaint: Patient states" my nails have grown long and thick and become painful to walk and wear shoes" Patient has been diagnosed with DM with no foot complications. The patient presents for preventative foot care services. No changes to ROS.  She has painful callus both feet.  Podiatric Exam: Vascular: dorsalis pedis and posterior tibial pulses are palpable bilateral. Capillary return is immediate. Temperature gradient is WNL. Skin turgor WNL  Sensorium: Normal Semmes Weinstein monofilament test. Normal tactile sensation bilaterally. Nail Exam: Pt has thick disfigured discolored nails with subungual debris noted bilateral entire nail hallux through fifth toenails Ulcer Exam: There is no evidence of ulcer or pre-ulcerative changes or infection. Orthopedic Exam: Muscle tone and strength are WNL. No limitations in general ROM. No crepitus or effusions noted. Foot type and digits show no abnormalities. Bony prominences are unremarkable. Skin:  Porokeratosis sub 2  B/L and sub 4,5 right  foot.   No infection or ulcers.  Asymptomatic heel callus.  Diagnosis:  Onychomycosis, , Pain in right toe, pain in left toes.  Porokeratosis  B/L  Treatment & Plan Procedures and Treatment: Consent by patient was obtained for treatment procedures. The patient understood the discussion of treatment and procedures well. All questions were answered thoroughly reviewed. Debridement of mycotic and hypertrophic toenails, 1 through 5 bilateral and clearing of subungual debris. No ulceration, no infection noted. Debride porokeratosis  B/L   Return Visit-Office Procedure: Patient instructed to return to the office for a follow up visit 3 months for continued evaluation and treatment.    Adela Esteban DPM 

## 2018-02-13 ENCOUNTER — Inpatient Hospital Stay: Payer: Medicare Other | Attending: Hematology and Oncology

## 2018-02-13 ENCOUNTER — Inpatient Hospital Stay: Payer: Medicare Other

## 2018-02-13 DIAGNOSIS — C50911 Malignant neoplasm of unspecified site of right female breast: Secondary | ICD-10-CM | POA: Diagnosis present

## 2018-02-13 DIAGNOSIS — Z853 Personal history of malignant neoplasm of breast: Secondary | ICD-10-CM

## 2018-02-13 LAB — BASIC METABOLIC PANEL
Anion gap: 9 (ref 5–15)
BUN: 13 mg/dL (ref 6–20)
CO2: 26 mmol/L (ref 22–32)
Calcium: 8.6 mg/dL — ABNORMAL LOW (ref 8.9–10.3)
Chloride: 102 mmol/L (ref 101–111)
Creatinine, Ser: 0.67 mg/dL (ref 0.44–1.00)
GFR calc Af Amer: >60 mL/min (ref >60)
GFR calc non Af Amer: >60 mL/min (ref >60)
Glucose, Bld: 159 mg/dL — ABNORMAL HIGH (ref 65–99)
Potassium: 4.2 mmol/L (ref 3.5–5.1)
Sodium: 137 mmol/L (ref 135–145)

## 2018-02-13 NOTE — Progress Notes (Signed)
Patient is scheduled for lab/Prolia today.  Calcium level is 8.6 today.  She does not take a Calcium supplement.  Doran Durand, NP informed and he recommends:    Patient informed of above and I have sent a scheduling message.

## 2018-02-20 ENCOUNTER — Other Ambulatory Visit: Payer: Self-pay | Admitting: Urgent Care

## 2018-02-20 ENCOUNTER — Inpatient Hospital Stay: Payer: Medicare Other

## 2018-02-20 ENCOUNTER — Inpatient Hospital Stay: Payer: Medicare Other | Attending: Hematology and Oncology

## 2018-02-20 DIAGNOSIS — M810 Age-related osteoporosis without current pathological fracture: Secondary | ICD-10-CM | POA: Insufficient documentation

## 2018-02-20 LAB — COMPREHENSIVE METABOLIC PANEL
ALBUMIN: 3.7 g/dL (ref 3.5–5.0)
ALT: 9 U/L — ABNORMAL LOW (ref 14–54)
AST: 20 U/L (ref 15–41)
Alkaline Phosphatase: 25 U/L — ABNORMAL LOW (ref 38–126)
Anion gap: 9 (ref 5–15)
BUN: 13 mg/dL (ref 6–20)
CHLORIDE: 97 mmol/L — AB (ref 101–111)
CO2: 27 mmol/L (ref 22–32)
CREATININE: 0.83 mg/dL (ref 0.44–1.00)
Calcium: 9.1 mg/dL (ref 8.9–10.3)
GFR calc Af Amer: >60 mL/min (ref >60)
GLUCOSE: 238 mg/dL — AB (ref 65–99)
POTASSIUM: 4.1 mmol/L (ref 3.5–5.1)
Sodium: 133 mmol/L — ABNORMAL LOW (ref 135–145)
Total Bilirubin: 0.5 mg/dL (ref 0.3–1.2)
Total Protein: 6 g/dL — ABNORMAL LOW (ref 6.5–8.1)

## 2018-02-20 MED ORDER — DENOSUMAB 60 MG/ML ~~LOC~~ SOLN
60.0000 mg | Freq: Once | SUBCUTANEOUS | Status: AC
  Administered 2018-02-20: 60 mg via SUBCUTANEOUS
  Filled 2018-02-20: qty 1

## 2018-02-20 NOTE — Progress Notes (Signed)
Patient has started OTC calcium 2 bid.

## 2018-05-08 ENCOUNTER — Encounter: Payer: Self-pay | Admitting: Podiatry

## 2018-05-08 ENCOUNTER — Ambulatory Visit (INDEPENDENT_AMBULATORY_CARE_PROVIDER_SITE_OTHER): Payer: Medicare Other | Admitting: Podiatry

## 2018-05-08 DIAGNOSIS — B351 Tinea unguium: Secondary | ICD-10-CM

## 2018-05-08 DIAGNOSIS — M79675 Pain in left toe(s): Secondary | ICD-10-CM | POA: Diagnosis not present

## 2018-05-08 DIAGNOSIS — E109 Type 1 diabetes mellitus without complications: Secondary | ICD-10-CM | POA: Diagnosis not present

## 2018-05-08 DIAGNOSIS — L851 Acquired keratosis [keratoderma] palmaris et plantaris: Secondary | ICD-10-CM

## 2018-05-08 DIAGNOSIS — M79674 Pain in right toe(s): Secondary | ICD-10-CM

## 2018-05-08 NOTE — Progress Notes (Signed)
Complaint:  Visit Type: Patient returns to my office for continued preventative foot care services. Complaint: Patient states" my nails have grown long and thick and become painful to walk and wear shoes" Patient has been diagnosed with DM with no foot complications. The patient presents for preventative foot care services. No changes to ROS.  She has painful callus both feet.  Podiatric Exam: Vascular: dorsalis pedis and posterior tibial pulses are palpable bilateral. Capillary return is immediate. Temperature gradient is WNL. Skin turgor WNL  Sensorium: Normal Semmes Weinstein monofilament test. Normal tactile sensation bilaterally. Nail Exam: Pt has thick disfigured discolored nails with subungual debris noted bilateral entire nail hallux through fifth toenails Ulcer Exam: There is no evidence of ulcer or pre-ulcerative changes or infection. Orthopedic Exam: Muscle tone and strength are WNL. No limitations in general ROM. No crepitus or effusions noted. Foot type and digits show no abnormalities. Bony prominences are unremarkable. Skin:  Porokeratosis sub 2  B/L and sub 4,5 right  foot.   No infection or ulcers.  Asymptomatic heel callus.  Diagnosis:  Onychomycosis, , Pain in right toe, pain in left toes.  Porokeratosis  B/L  Treatment & Plan Procedures and Treatment: Consent by patient was obtained for treatment procedures. The patient understood the discussion of treatment and procedures well. All questions were answered thoroughly reviewed. Debridement of mycotic and hypertrophic toenails, 1 through 5 bilateral and clearing of subungual debris. No ulceration, no infection noted. Debride porokeratosis  B/L   Return Visit-Office Procedure: Patient instructed to return to the office for a follow up visit 3 months for continued evaluation and treatment.    Jamonte Curfman DPM 

## 2018-06-25 ENCOUNTER — Telehealth: Payer: Self-pay | Admitting: *Deleted

## 2018-06-25 MED ORDER — TAMOXIFEN CITRATE 20 MG PO TABS: 20.0000 mg | ORAL_TABLET | Freq: Every day | ORAL | 0 refills | Status: DC

## 2018-06-25 NOTE — Telephone Encounter (Signed)
Refill Tamoxifen

## 2018-06-26 ENCOUNTER — Telehealth: Payer: Self-pay | Admitting: *Deleted

## 2018-06-26 NOTE — Telephone Encounter (Signed)
Attempted to call patient.  Patient not at home.  Will attempt to call back later.

## 2018-06-26 NOTE — Telephone Encounter (Signed)
-----   Message from Lequita Asal, MD sent at 06/26/2018  5:02 PM EDT ----- Regarding: Please call patient  Refill request today for tamoxifen.  Is she still taking as she has been on it for > 5 years.  M

## 2018-06-27 ENCOUNTER — Telehealth: Payer: Self-pay | Admitting: *Deleted

## 2018-06-27 NOTE — Telephone Encounter (Signed)
-----   Message from Lequita Asal, MD sent at 06/26/2018  5:02 PM EDT ----- Regarding: Please call patient  Refill request today for tamoxifen.  Is she still taking as she has been on it for > 5 years.  M

## 2018-06-27 NOTE — Telephone Encounter (Signed)
Called and spoke to patient's husband.  The tamoxifen has continually been refilled by [facility].  Patient is still taking.  However, she only has about 6 pills left.  Please advise and I will call patient back.  Thanks!

## 2018-06-29 NOTE — Telephone Encounter (Signed)
  We typically discuss BCI testing (5 versus 10 years of adjuvant hormonal therapy).  If she does not want BCI testing, then we should discontinue tamoxifen as she has been on it for > 5 years.  If she wants BCI testing (to tell us the benefit of years 5-10 hormonal therapy), then we should send it.  There is a cost for testing.  Usually the patient talks to the company to determine her cost.  She may have this paperwork.  M

## 2018-07-01 ENCOUNTER — Telehealth: Payer: Self-pay | Admitting: *Deleted

## 2018-07-01 NOTE — Telephone Encounter (Signed)
Called patient to discuss her tamoxifen and the fact that she has been on it for 5 years.  Explained to her that she can have BCI testing to determine if she should continue on it longer.  She states she will talk to her husband and let me know.

## 2018-08-11 ENCOUNTER — Ambulatory Visit (INDEPENDENT_AMBULATORY_CARE_PROVIDER_SITE_OTHER): Payer: Medicare Other | Admitting: Podiatry

## 2018-08-11 ENCOUNTER — Encounter: Payer: Self-pay | Admitting: Podiatry

## 2018-08-11 DIAGNOSIS — B351 Tinea unguium: Secondary | ICD-10-CM | POA: Diagnosis not present

## 2018-08-11 DIAGNOSIS — M79675 Pain in left toe(s): Secondary | ICD-10-CM

## 2018-08-11 DIAGNOSIS — E109 Type 1 diabetes mellitus without complications: Secondary | ICD-10-CM

## 2018-08-11 DIAGNOSIS — M79674 Pain in right toe(s): Secondary | ICD-10-CM

## 2018-08-11 DIAGNOSIS — L851 Acquired keratosis [keratoderma] palmaris et plantaris: Secondary | ICD-10-CM | POA: Diagnosis not present

## 2018-08-11 NOTE — Progress Notes (Signed)
Complaint:  Visit Type: Patient returns to my office for continued preventative foot care services. Complaint: Patient states" my nails have grown long and thick and become painful to walk and wear shoes" Patient has been diagnosed with DM with no foot complications. The patient presents for preventative foot care services. No changes to ROS.  She has painful callus both feet.  Podiatric Exam: Vascular: dorsalis pedis and posterior tibial pulses are palpable bilateral. Capillary return is immediate. Temperature gradient is WNL. Skin turgor WNL  Sensorium: Normal Semmes Weinstein monofilament test. Normal tactile sensation bilaterally. Nail Exam: Pt has thick disfigured discolored nails with subungual debris noted bilateral entire nail hallux through fifth toenails Ulcer Exam: There is no evidence of ulcer or pre-ulcerative changes or infection. Orthopedic Exam: Muscle tone and strength are WNL. No limitations in general ROM. No crepitus or effusions noted. Foot type and digits show no abnormalities. Bony prominences are unremarkable. Skin:  Porokeratosis sub 2  B/L and sub 4,5 right  foot.   No infection or ulcers.  Asymptomatic heel callus.  Diagnosis:  Onychomycosis, , Pain in right toe, pain in left toes.  Porokeratosis  B/L  Treatment & Plan Procedures and Treatment: Consent by patient was obtained for treatment procedures. The patient understood the discussion of treatment and procedures well. All questions were answered thoroughly reviewed. Debridement of mycotic and hypertrophic toenails, 1 through 5 bilateral and clearing of subungual debris. No ulceration, no infection noted. Debride porokeratosis  B/L   Return Visit-Office Procedure: Patient instructed to return to the office for a follow up visit 3 months for continued evaluation and treatment.    Gordie Belvin DPM 

## 2018-08-15 ENCOUNTER — Other Ambulatory Visit: Payer: Self-pay

## 2018-08-15 ENCOUNTER — Inpatient Hospital Stay: Payer: Medicare Other

## 2018-08-15 ENCOUNTER — Encounter: Payer: Self-pay | Admitting: Hematology and Oncology

## 2018-08-15 ENCOUNTER — Inpatient Hospital Stay: Payer: Medicare Other | Attending: Hematology and Oncology | Admitting: Hematology and Oncology

## 2018-08-15 VITALS — BP 144/77 | HR 82 | Temp 96.7°F | Resp 18 | Wt 103.6 lb

## 2018-08-15 DIAGNOSIS — Z170 Estrogen receptor positive status [ER+]: Secondary | ICD-10-CM

## 2018-08-15 DIAGNOSIS — M810 Age-related osteoporosis without current pathological fracture: Secondary | ICD-10-CM | POA: Insufficient documentation

## 2018-08-15 DIAGNOSIS — Z853 Personal history of malignant neoplasm of breast: Secondary | ICD-10-CM

## 2018-08-15 DIAGNOSIS — E1110 Type 2 diabetes mellitus with ketoacidosis without coma: Secondary | ICD-10-CM | POA: Insufficient documentation

## 2018-08-15 DIAGNOSIS — C50911 Malignant neoplasm of unspecified site of right female breast: Secondary | ICD-10-CM | POA: Insufficient documentation

## 2018-08-15 DIAGNOSIS — Z794 Long term (current) use of insulin: Secondary | ICD-10-CM | POA: Insufficient documentation

## 2018-08-15 DIAGNOSIS — Z9011 Acquired absence of right breast and nipple: Secondary | ICD-10-CM | POA: Insufficient documentation

## 2018-08-15 LAB — CBC WITH DIFFERENTIAL/PLATELET
Basophils Absolute: 0 10*3/uL (ref 0–0.1)
Basophils Relative: 1 %
Eosinophils Absolute: 0.1 10*3/uL (ref 0–0.7)
Eosinophils Relative: 2 %
HCT: 35.8 % (ref 35.0–47.0)
Hemoglobin: 12 g/dL (ref 12.0–16.0)
Lymphocytes Relative: 35 %
Lymphs Abs: 2.2 10*3/uL (ref 1.0–3.6)
MCH: 32.7 pg (ref 26.0–34.0)
MCHC: 33.5 g/dL (ref 32.0–36.0)
MCV: 97.5 fL (ref 80.0–100.0)
Monocytes Absolute: 0.6 10*3/uL (ref 0.2–0.9)
Monocytes Relative: 10 %
Neutro Abs: 3.2 10*3/uL (ref 1.4–6.5)
Neutrophils Relative %: 52 %
Platelets: 179 10*3/uL (ref 150–440)
RBC: 3.67 MIL/uL — ABNORMAL LOW (ref 3.80–5.20)
RDW: 13.7 % (ref 11.5–14.5)
WBC: 6.1 10*3/uL (ref 3.6–11.0)

## 2018-08-15 LAB — COMPREHENSIVE METABOLIC PANEL
ALT: 9 U/L (ref 0–44)
AST: 20 U/L (ref 15–41)
Albumin: 3.6 g/dL (ref 3.5–5.0)
Alkaline Phosphatase: 20 U/L — ABNORMAL LOW (ref 38–126)
Anion gap: 8 (ref 5–15)
BUN: 12 mg/dL (ref 8–23)
CO2: 26 mmol/L (ref 22–32)
Calcium: 8.5 mg/dL — ABNORMAL LOW (ref 8.9–10.3)
Chloride: 105 mmol/L (ref 98–111)
Creatinine, Ser: 0.83 mg/dL (ref 0.44–1.00)
GFR calc Af Amer: >60 mL/min (ref >60)
GFR calc non Af Amer: >60 mL/min (ref >60)
Glucose, Bld: 108 mg/dL — ABNORMAL HIGH (ref 70–99)
Potassium: 3.8 mmol/L (ref 3.5–5.1)
Sodium: 139 mmol/L (ref 135–145)
Total Bilirubin: 0.6 mg/dL (ref 0.3–1.2)
Total Protein: 6 g/dL — ABNORMAL LOW (ref 6.5–8.1)

## 2018-08-15 NOTE — Progress Notes (Signed)
Patient offers no complaints today. 

## 2018-08-15 NOTE — Progress Notes (Signed)
[name] day:  08/15/18  Chief Complaint: [name] [name] is an 77 y.o. female with stage IIA right breast cancer who is seen for 1 year assessment.  HPI: The patient was last seen in the medical oncology clinic on 08/09/2017.  At that time, she felt well.  Exam revealed fibrocystic changes in the LEFT breast.  Mammogram on 04/24/2017 revealed no evidence of malignancy.  She was admitted to [facility] from 11/13/2017 - 11/19/2017 with malaise, weakness, and altered mental status.  She was diagnosed with DKA.  Notes reviewed.  She was in the ICU on an insulin drip.  She was diagnosed with a community acquired pneumonia and treated with ceftriaxone and azithromycin.  She was discharged to [facility].  She received Prolia on 08/16/2017 and 02/20/2018.  The patient spoke with [name] Boozer, RN during the interim regarding her ongoing tamoxifen and whether she wished to have BCI testing performed on her tumor to determine the benefit of extended adjuvant endocrine therapy.  During the interim, patient has been doing well. She states, "The older I get, the more I try to enjoy life". She denies any acute concerns. She notes that her only real problem is her diabetes. Patient denies that she has experienced any B symptoms. She denies any interval infections.  Patient does not verbalize any concerns with regards to her breasts today. Patient performs monthly self breast examinations as recommended. Patient remains on hormonal therapy (tamoxifen).   Patient advises that she maintains an adequate appetite. She is eating well. Weight today is 103 lb 9 oz (47 kg), which compared to her last visit to the clinic, represents a 4 pound increase. Patient is taking calcium 1000 mg and vitamin D 250 mg supplements daily. She states, "Some days I might miss one, but I take it the next day".   Patient denies pain in the clinic today.   Past Medical History:  Diagnosis Date   . Breast cancer ([place]) 2011   RT MASTECTOMY  . Cancer ([place]) 2011   BREAST CA  . diabetes insulin dep   . High cholesterol   . Hypertension   . Hypothyroidism   . Osteoporosis   . Thyroid disease     Past Surgical History:  Procedure Laterality Date  . BREAST BIOPSY Right 2011   positive  . CATARACT EXTRACTION W/PHACO Right 06/13/2016   Procedure: CATARACT EXTRACTION PHACO AND INTRAOCULAR LENS PLACEMENT (IOC);  Surgeon: Estill Cotta, MD;  Location: [facility] ORS;  Service: Ophthalmology;  Laterality: Right;  [place] 03:07 AP% 27.4 CDE 93.21 fluid pack lot # [id number]  . MASTECTOMY Right 2011   positive  . TONSILLECTOMY    . TOTAL ABDOMINAL HYSTERECTOMY      Family History  Problem Relation Age of Onset  . Heart disease Father        heart attack  . Breast cancer Neg Hx     Social History:  reports that she has never smoked. She has never used smokeless tobacco. She reports that she does not drink alcohol or use drugs.  The patient is accompanied by her husband, [name] [name].  Allergies:  Allergies  Allergen Reactions  . Lisinopril Rash and Other (See Comments)    Hyponatremia  Hyponatremia  Hyponatremia   . Sulfa Antibiotics Other (See Comments)    possible possible Patient doesn't know   . Sulfasalazine     Current Medications: Current Outpatient Medications  Medication Sig Dispense Refill  . amLODipine (NORVASC)  2.5 MG tablet     . Calcium Carbonate-Vitamin D (OYSTER SHELL CALCIUM 500 + D) 500-125 MG-UNIT TABS Take by mouth.    . dorzolamide-timolol (COSOPT) 22.3-6.8 MG/ML ophthalmic solution Place 1 drop into the right eye 2 (two) times daily. 10 mL 1  . enalapril (VASOTEC) 5 MG tablet Take 1 tablet (5 mg total) by mouth daily. 30 tablet 2  . insulin glargine (LANTUS) 100 UNIT/ML injection Inject 0.1 mLs (10 Units total) into the skin daily. 10 mL 11  . insulin lispro (HUMALOG) 100 UNIT/ML injection Inject into the skin.    Marland Kitchen lovastatin (MEVACOR) 20 MG tablet Take 20  mg by mouth at bedtime.    . tamoxifen (NOLVADEX) 20 MG tablet Take 1 tablet (20 mg total) by mouth daily. 90 tablet 0  . brimonidine (ALPHAGAN) 0.2 % ophthalmic solution Place 1 drop into the right eye 3 (three) times daily. (Patient not taking: Reported on 11/13/2017) 5 mL 1  . denosumab (PROLIA) 60 MG/ML SOLN injection Inject 60 mg into the skin every 6 (six) months.    . DUREZOL 0.05 % EMUL Place 1 drop into the right eye every other day.     . insulin aspart (NOVOLOG) 100 UNIT/ML injection Inject 0-9 Units into the skin 4 (four) times daily. (Patient not taking: Reported on 08/15/2018) 10 mL 11  . moxifloxacin (VIGAMOX) 0.5 % ophthalmic solution Place 1 drop into the right eye 4 (four) times daily. (Patient not taking: Reported on 11/13/2017) 3 mL 1   No current facility-administered medications for this visit.     Review of Systems:  GENERAL:  "Enjoying living".  No fevers, sweats or weight loss.  Weight up 4 pounds. PERFORMANCE STATUS (ECOG):  0 HEENT:  No visual changes, runny nose, sore throat, mouth sores or tenderness. Lungs: No shortness of breath or cough.  No hemoptysis. Cardiac:  No chest pain, palpitations, orthopnea, or PND. GI:  No nausea, vomiting, diarrhea, constipation, melena or hematochezia. GU:  No urgency, frequency, dysuria, or hematuria. Musculoskeletal:  No back pain.  No joint pain.  No muscle tenderness. Extremities:  No pain or swelling. Skin:  No rashes or skin changes. Neuro:  No headache, numbness or weakness, balance or coordination issues. Endocrine:  Diabetes.  Thyroid disease on Synthroid.  No hot flashes or night sweats. Psych:  No mood changes, depression or anxiety. Pain:  No focal pain. Review of systems:  All other systems reviewed and found to be negative.   Physical Exam: Blood pressure (!) 144/77, pulse 82, temperature (!) 96.7 F (35.9 C), temperature source Tympanic, resp. rate 18, weight 103 lb 9 oz (47 kg). GENERAL:  Thin elderly woman  sitting comfortably in the exam room in no acute distress. MENTAL STATUS:  Alert and oriented to person, place and time. HEAD:  Pearline Cables short hair.  Normocephalic, atraumatic, face symmetric, no Cushingoid features. EYES:  Glasses.  Hazel eyes.  Pupils equal round and reactive to light and accomodation.  No conjunctivitis or scleral icterus. ENT:  Oropharynx clear without lesion.  Tongue normal. Mucous membranes moist.  RESPIRATORY:  Clear to auscultation without rales, wheezes or rhonchi. CARDIOVASCULAR:  Regular rate and rhythm without murmur, rub or gallop. BREAST:  Right mastectomy.  No erythema or nodularity.  Left breast without masses, skin changes or nipple discharge. ABDOMEN:  Soft, non-tender, with active bowel sounds, and no hepatosplenomegaly.  No masses. SKIN:  No rashes, ulcers or lesions. EXTREMITIES: No edema, no skin discoloration or tenderness.  No  palpable cords. LYMPH NODES: No palpable cervical, supraclavicular, axillary or inguinal adenopathy  NEUROLOGICAL: Unremarkable. PSYCH:  Appropriate.    Orders Only on 08/15/2018  Component Date Value Ref Range Status  . Sodium 08/15/2018 139  135 - 145 mmol/L Final  . Potassium 08/15/2018 3.8  3.5 - 5.1 mmol/L Final  . Chloride 08/15/2018 105  98 - 111 mmol/L Final  . CO2 08/15/2018 26  22 - 32 mmol/L Final  . Glucose, Bld 08/15/2018 108* 70 - 99 mg/dL Final  . BUN 08/15/2018 12  8 - 23 mg/dL Final  . Creatinine, Ser 08/15/2018 0.83  0.44 - 1.00 mg/dL Final  . Calcium 08/15/2018 8.5* 8.9 - 10.3 mg/dL Final  . Total Protein 08/15/2018 6.0* 6.5 - 8.1 g/dL Final  . Albumin 08/15/2018 3.6  3.5 - 5.0 g/dL Final  . AST 08/15/2018 20  15 - 41 U/L Final  . ALT 08/15/2018 9  0 - 44 U/L Final  . Alkaline Phosphatase 08/15/2018 20* 38 - 126 U/L Final  . Total Bilirubin 08/15/2018 0.6  0.3 - 1.2 mg/dL Final  . GFR calc non Af Amer 08/15/2018 >60  >60 mL/min Final  . GFR calc Af Amer 08/15/2018 >60  >60 mL/min Final   Comment:  (NOTE) The eGFR has been calculated using the CKD EPI equation. This calculation has not been validated in all clinical situations. eGFR's persistently <60 mL/min signify possible Chronic Kidney Disease.   Georgiann Hahn gap 08/15/2018 8  5 - 15 Final   Performed at [facility], [address] [place], [place] [address]  . WBC 08/15/2018 6.1  3.6 - 11.0 K/uL Final  . RBC 08/15/2018 3.67* 3.80 - 5.20 MIL/uL Final  . Hemoglobin 08/15/2018 12.0  12.0 - 16.0 g/dL Final  . HCT 08/15/2018 35.8  35.0 - 47.0 % Final  . MCV 08/15/2018 97.5  80.0 - 100.0 fL Final  . MCH 08/15/2018 32.7  26.0 - 34.0 pg Final  . MCHC 08/15/2018 33.5  32.0 - 36.0 g/dL Final  . RDW 08/15/2018 13.7  11.5 - 14.5 % Final  . Platelets 08/15/2018 179  150 - 440 K/uL Final  . Neutrophils Relative % 08/15/2018 52  % Final  . Neutro Abs 08/15/2018 3.2  1.4 - 6.5 K/uL Final  . Lymphocytes Relative 08/15/2018 35  % Final  . Lymphs Abs 08/15/2018 2.2  1.0 - 3.6 K/uL Final  . Monocytes Relative 08/15/2018 10  % Final  . Monocytes Absolute 08/15/2018 0.6  0.2 - 0.9 K/uL Final  . Eosinophils Relative 08/15/2018 2  % Final  . Eosinophils Absolute 08/15/2018 0.1  0 - 0.7 K/uL Final  . Basophils Relative 08/15/2018 1  % Final  . Basophils Absolute 08/15/2018 0.0  0 - 0.1 K/uL Final   Performed at [facility], [address], [place], [place] [address]    Assessment:  MONZERRAT WELLEN is an 77 y.o. female with a history of stage IIA right breast cancer s/p mastectomy on 06/01/2010. 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 Eight sentinel lymph nodes were negative for malignancy. Pathologic stage was T2N0M0.  Oncotype DX testing on 06/19/2010 revealed a recurrent score of 31. She did not receive chemotherapy or radiation. She began tamoxifen on 07/07/2010.    CA27.29 has been followed: 15.9 on 07/07/2010, 17.8 on 03/20/2011, 16.5 on 07/29/2012, 18.6 on 01/27/2013, 10.1 on 05/28/2014, 26.3 on  09/28/2014, 19.8 on 04/26/2015, 18 on 10/27/2015, 16.3 on 05/03/2016, 17.5 on 08/16/2017,  and 12.8 on 08/15/2018.  Left-sided mammogram on 04/24/2017 revealed no evidence of malignancy.  Bone density study on 04/23/2016 revealed osteoporosis with a T-score of -4.5 in L1-L3, and -3.2 in the right femur.  She is on calcium and vitamin D. She was previously on alendronate. She began Prolia on 05/03/2016 (last 02/20/2018).  Symptomatically, she denies any concerns.  Exam is stable.  Calcium is 8.5.  Plan: 1.  Labs today:  CBC with diff, CMP, CA27.29. 2.  Stage IIA right breast cancer:  Left mammogram was due on 04/24/2018.  Reschedule mammogram for 08/27/2018.  Discuss ongoing tamoxifen and BCI testing.  Patient would like to continue tamoxifen for 10 years with no testing. 3.  Osteoporosis:  Patient on calcium and vitamin D.  Discuss calcium 1200 mg/day and vitamin D 800 units/day.  Last Prolia on 02/20/2018.  Hold Prolia today secondary to hypocalcemia. 4.  RTC next week for labs (BMP) and +/- Prolia 5.  RTC in 6 months for labs (BMP) and Prolia. 6.  RTC in 1 year for MD assessment, labs (CBC with diff, CMP, CA27.29), and Prolia.   Honor Loh, NP 08/15/2018, 11:56 AM   I saw and evaluated the patient, participating in the key portions of the service and reviewing pertinent diagnostic studies and records.  I reviewed the nurse practitioner's note and agree with the findings and the plan.  The assessment and plan were discussed with the patient.  Several questions were asked by the patient and answered.   Lequita Asal, MD 08/15/2018,11:56 AM

## 2018-08-15 NOTE — Patient Instructions (Signed)
Need to get new calcium and vitamin D supplements.  1. Calcium dose needs to be 1200 mg  2. Vitamin D dose needs to be 800 IU  It is ok to get the combination tablets. Look for the calcium 600 mg / vitamin D 400 IU tablets. You will need to take 2 pills a day, just like you have been.  If you have questions, please do not hesitate to show this to you pharmacist, or call us here in the office.  Respectfully, Honor Loh, MSN, APRN, FNP-C, CEN Oncology/Hematology Nurse Practitioner  [place]

## 2018-08-17 LAB — CA 27.29 (SERIAL MONITOR): CA 27.29: 12.8 U/mL (ref 0.0–38.6)

## 2018-08-20 ENCOUNTER — Other Ambulatory Visit: Payer: Self-pay | Admitting: Hematology and Oncology

## 2018-08-20 ENCOUNTER — Other Ambulatory Visit: Payer: Self-pay | Admitting: Urgent Care

## 2018-08-22 ENCOUNTER — Inpatient Hospital Stay: Payer: Medicare Other

## 2018-08-22 ENCOUNTER — Inpatient Hospital Stay: Payer: Medicare Other | Attending: Hematology and Oncology

## 2018-08-22 DIAGNOSIS — M810 Age-related osteoporosis without current pathological fracture: Secondary | ICD-10-CM | POA: Insufficient documentation

## 2018-08-22 DIAGNOSIS — C50911 Malignant neoplasm of unspecified site of right female breast: Secondary | ICD-10-CM

## 2018-08-22 DIAGNOSIS — Z170 Estrogen receptor positive status [ER+]: Secondary | ICD-10-CM

## 2018-08-22 LAB — BASIC METABOLIC PANEL
Anion gap: 8 (ref 5–15)
BUN: 11 mg/dL (ref 8–23)
CO2: 25 mmol/L (ref 22–32)
Calcium: 8.8 mg/dL — ABNORMAL LOW (ref 8.9–10.3)
Chloride: 103 mmol/L (ref 98–111)
Creatinine, Ser: 0.84 mg/dL (ref 0.44–1.00)
GFR calc Af Amer: >60 mL/min (ref >60)
GFR calc non Af Amer: >60 mL/min (ref >60)
Glucose, Bld: 179 mg/dL — ABNORMAL HIGH (ref 70–99)
Potassium: 4.1 mmol/L (ref 3.5–5.1)
Sodium: 136 mmol/L (ref 135–145)

## 2018-08-22 MED ORDER — DENOSUMAB 60 MG/ML ~~LOC~~ SOSY
60.0000 mg | PREFILLED_SYRINGE | Freq: Once | SUBCUTANEOUS | Status: AC
  Administered 2018-08-22: 60 mg via SUBCUTANEOUS
  Filled 2018-08-22: qty 1

## 2018-08-27 ENCOUNTER — Ambulatory Visit
Admission: RE | Admit: 2018-08-27 | Discharge: 2018-08-27 | Disposition: A | Discharge status: Home / Self Care | Payer: Medicare Other | Source: Ambulatory Visit | Attending: Urgent Care | Admitting: Urgent Care

## 2018-08-27 DIAGNOSIS — Z1231 Encounter for screening mammogram for malignant neoplasm of breast: Secondary | ICD-10-CM | POA: Diagnosis present

## 2018-08-27 DIAGNOSIS — Z170 Estrogen receptor positive status [ER+]: Secondary | ICD-10-CM

## 2018-08-27 DIAGNOSIS — C50911 Malignant neoplasm of unspecified site of right female breast: Secondary | ICD-10-CM

## 2018-08-27 DIAGNOSIS — Z853 Personal history of malignant neoplasm of breast: Secondary | ICD-10-CM | POA: Insufficient documentation

## 2018-09-23 ENCOUNTER — Other Ambulatory Visit: Payer: Self-pay | Admitting: Hematology and Oncology

## 2018-11-10 ENCOUNTER — Ambulatory Visit (INDEPENDENT_AMBULATORY_CARE_PROVIDER_SITE_OTHER): Payer: Medicare Other | Admitting: Podiatry

## 2018-11-10 ENCOUNTER — Encounter: Payer: Self-pay | Admitting: Podiatry

## 2018-11-10 DIAGNOSIS — E109 Type 1 diabetes mellitus without complications: Secondary | ICD-10-CM

## 2018-11-10 DIAGNOSIS — M79674 Pain in right toe(s): Secondary | ICD-10-CM

## 2018-11-10 DIAGNOSIS — M79675 Pain in left toe(s): Secondary | ICD-10-CM | POA: Diagnosis not present

## 2018-11-10 DIAGNOSIS — B351 Tinea unguium: Secondary | ICD-10-CM

## 2018-11-10 DIAGNOSIS — L851 Acquired keratosis [keratoderma] palmaris et plantaris: Secondary | ICD-10-CM | POA: Diagnosis not present

## 2018-11-10 NOTE — Progress Notes (Signed)
Complaint:  Visit Type: Patient returns to my office for continued preventative foot care services. Complaint: Patient states" my nails have grown long and thick and become painful to walk and wear shoes" Patient has been diagnosed with DM with no foot complications. The patient presents for preventative foot care services. No changes to ROS.  She has painful callus both feet.  Podiatric Exam: Vascular: dorsalis pedis and posterior tibial pulses are palpable bilateral. Capillary return is immediate. Temperature gradient is WNL. Skin turgor WNL  Sensorium: Normal Semmes Weinstein monofilament test. Normal tactile sensation bilaterally. Nail Exam: Pt has thick disfigured discolored nails with subungual debris noted bilateral entire nail hallux through fifth toenails Ulcer Exam: There is no evidence of ulcer or pre-ulcerative changes or infection. Orthopedic Exam: Muscle tone and strength are WNL. No limitations in general ROM. No crepitus or effusions noted. Foot type and digits show no abnormalities. Bony prominences are unremarkable. Skin:  Porokeratosis sub 2  B/L and sub 4,5 right  foot.   No infection or ulcers.  Asymptomatic heel callus.  Diagnosis:  Onychomycosis, , Pain in right toe, pain in left toes.  Porokeratosis  B/L  Treatment & Plan Procedures and Treatment: Consent by patient was obtained for treatment procedures. The patient understood the discussion of treatment and procedures well. All questions were answered thoroughly reviewed. Debridement of mycotic and hypertrophic toenails, 1 through 5 bilateral and clearing of subungual debris. No ulceration, no infection noted. Debride porokeratosis  B/L Return Visit-Office Procedure: Patient instructed to return to the office for a follow up visit 3 months for continued evaluation and treatment.    Gardiner Barefoot DPM

## 2019-02-09 ENCOUNTER — Other Ambulatory Visit: Payer: Self-pay

## 2019-02-09 ENCOUNTER — Ambulatory Visit (INDEPENDENT_AMBULATORY_CARE_PROVIDER_SITE_OTHER): Payer: Medicare Other | Admitting: Podiatry

## 2019-02-09 ENCOUNTER — Encounter: Payer: Self-pay | Admitting: Podiatry

## 2019-02-09 DIAGNOSIS — M79674 Pain in right toe(s): Secondary | ICD-10-CM | POA: Diagnosis not present

## 2019-02-09 DIAGNOSIS — L851 Acquired keratosis [keratoderma] palmaris et plantaris: Secondary | ICD-10-CM | POA: Diagnosis not present

## 2019-02-09 DIAGNOSIS — B351 Tinea unguium: Secondary | ICD-10-CM

## 2019-02-09 DIAGNOSIS — M79675 Pain in left toe(s): Secondary | ICD-10-CM

## 2019-02-09 DIAGNOSIS — E109 Type 1 diabetes mellitus without complications: Secondary | ICD-10-CM | POA: Diagnosis not present

## 2019-02-09 NOTE — Progress Notes (Signed)
Complaint:  Visit Type: Patient returns to my office for continued preventative foot care services. Complaint: Patient states" my nails have grown long and thick and become painful to walk and wear shoes" Patient has been diagnosed with DM with no foot complications. The patient presents for preventative foot care services. No changes to ROS.  She has painful callus both feet.  Podiatric Exam: Vascular: dorsalis pedis and posterior tibial pulses are palpable bilateral. Capillary return is immediate. Temperature gradient is WNL. Skin turgor WNL  Sensorium: Normal Semmes Weinstein monofilament test. Normal tactile sensation bilaterally. Nail Exam: Pt has thick disfigured discolored nails with subungual debris noted bilateral entire nail hallux through fifth toenails Ulcer Exam: There is no evidence of ulcer or pre-ulcerative changes or infection. Orthopedic Exam: Muscle tone and strength are WNL. No limitations in general ROM. No crepitus or effusions noted. Foot type and digits show no abnormalities. Bony prominences are unremarkable. Skin:  Porokeratosis sub 2  B/L and sub 4,5 right  foot.   No infection or ulcers.  Symptomatic heel callus.  Diagnosis:  Onychomycosis, , Pain in right toe, pain in left toes.  Porokeratosis  B/L  Treatment & Plan Procedures and Treatment: Consent by patient was obtained for treatment procedures. The patient understood the discussion of treatment and procedures well. All questions were answered thoroughly reviewed. Debridement of mycotic and hypertrophic toenails, 1 through 5 bilateral and clearing of subungual debris. No ulceration, no infection noted. Debride porokeratosis  B/L  Debride heel callus. Return Visit-Office Procedure: Patient instructed to return to the office for a follow up visit 3 months for continued evaluation and treatment.    Luiz Trumpower DPM 

## 2019-02-12 ENCOUNTER — Other Ambulatory Visit: Payer: Self-pay

## 2019-02-13 ENCOUNTER — Other Ambulatory Visit: Payer: Self-pay

## 2019-02-13 ENCOUNTER — Inpatient Hospital Stay: Payer: Medicare Other

## 2019-02-13 ENCOUNTER — Inpatient Hospital Stay: Payer: Medicare Other | Attending: Hematology and Oncology

## 2019-02-13 ENCOUNTER — Telehealth: Payer: Self-pay

## 2019-02-13 DIAGNOSIS — C50911 Malignant neoplasm of unspecified site of right female breast: Secondary | ICD-10-CM

## 2019-02-13 DIAGNOSIS — M810 Age-related osteoporosis without current pathological fracture: Secondary | ICD-10-CM | POA: Insufficient documentation

## 2019-02-13 DIAGNOSIS — Z170 Estrogen receptor positive status [ER+]: Principal | ICD-10-CM

## 2019-02-13 DIAGNOSIS — E871 Hypo-osmolality and hyponatremia: Secondary | ICD-10-CM

## 2019-02-13 LAB — BASIC METABOLIC PANEL
Anion gap: 7 (ref 5–15)
BUN: 14 mg/dL (ref 8–23)
CO2: 27 mmol/L (ref 22–32)
Calcium: 9.4 mg/dL (ref 8.9–10.3)
Chloride: 103 mmol/L (ref 98–111)
Creatinine, Ser: 0.92 mg/dL (ref 0.44–1.00)
GFR calc Af Amer: >60 mL/min (ref >60)
GFR calc non Af Amer: 59 mL/min — ABNORMAL LOW (ref >60)
Glucose, Bld: 151 mg/dL — ABNORMAL HIGH (ref 70–99)
Potassium: 5.2 mmol/L — ABNORMAL HIGH (ref 3.5–5.1)
Sodium: 137 mmol/L (ref 135–145)

## 2019-02-13 MED ORDER — DENOSUMAB 60 MG/ML ~~LOC~~ SOSY
60.0000 mg | PREFILLED_SYRINGE | Freq: Once | SUBCUTANEOUS | Status: AC
  Administered 2019-02-13: 60 mg via SUBCUTANEOUS
  Filled 2019-02-13: qty 1

## 2019-02-13 NOTE — Telephone Encounter (Signed)
Patient labs rourted to the patient PCP today.

## 2019-02-13 NOTE — Telephone Encounter (Signed)
-----   Message from Lequita Asal, MD sent at 02/13/2019  1:26 PM EDT ----- Regarding: Please call patient  Potassium elevated.  Is she taking potassium or a potassium sparing diuretic?  Need repeat potassium.  Send labs to PCP.  M ----- Message ----- From: Buel Ream, Lab In [place] Sent: 02/13/2019  11:07 AM EDT To: Lequita Asal, MD

## 2019-02-13 NOTE — Telephone Encounter (Signed)
Spoke with [name] [name] to see if she was taking potassium or any potassium sparing diuretic and the patient states no she not. Per her labs today [name] [name] potassium has increased to 5.1, per Dr Mike Gip she would like for the patient to come and get a repeat for potassium . I have schedule the patient for Monday 8:00 AM. [name] [name] was understanding and agreeable to come in on Monday and have repeat labs.

## 2019-02-16 ENCOUNTER — Inpatient Hospital Stay: Payer: Medicare Other

## 2019-02-16 ENCOUNTER — Other Ambulatory Visit: Payer: Self-pay

## 2019-02-16 DIAGNOSIS — E871 Hypo-osmolality and hyponatremia: Secondary | ICD-10-CM

## 2019-02-16 DIAGNOSIS — M810 Age-related osteoporosis without current pathological fracture: Secondary | ICD-10-CM | POA: Diagnosis not present

## 2019-02-16 LAB — POTASSIUM: Potassium: 4.6 mmol/L (ref 3.5–5.1)

## 2019-05-14 ENCOUNTER — Ambulatory Visit (INDEPENDENT_AMBULATORY_CARE_PROVIDER_SITE_OTHER): Payer: Medicare Other | Admitting: Podiatry

## 2019-05-14 ENCOUNTER — Other Ambulatory Visit: Payer: Self-pay

## 2019-05-14 ENCOUNTER — Encounter: Payer: Self-pay | Admitting: Podiatry

## 2019-05-14 DIAGNOSIS — M79674 Pain in right toe(s): Secondary | ICD-10-CM

## 2019-05-14 DIAGNOSIS — M79675 Pain in left toe(s): Secondary | ICD-10-CM | POA: Diagnosis not present

## 2019-05-14 DIAGNOSIS — Q828 Other specified congenital malformations of skin: Secondary | ICD-10-CM | POA: Insufficient documentation

## 2019-05-14 DIAGNOSIS — B351 Tinea unguium: Secondary | ICD-10-CM | POA: Diagnosis not present

## 2019-05-14 DIAGNOSIS — E109 Type 1 diabetes mellitus without complications: Secondary | ICD-10-CM

## 2019-05-14 NOTE — Progress Notes (Signed)
Complaint:  Visit Type: Patient returns to my office for continued preventative foot care services. Complaint: Patient states" my nails have grown long and thick and become painful to walk and wear shoes" Patient has been diagnosed with DM with no foot complications. The patient presents for preventative foot care services. No changes to ROS.  She has painful callus both feet.  Podiatric Exam: Vascular: dorsalis pedis and posterior tibial pulses are palpable bilateral. Capillary return is immediate. Temperature gradient is WNL. Skin turgor WNL  Sensorium: Normal Semmes Weinstein monofilament test. Normal tactile sensation bilaterally. Nail Exam: Pt has thick disfigured discolored nails with subungual debris noted bilateral entire nail hallux through fifth toenails Ulcer Exam: There is no evidence of ulcer or pre-ulcerative changes or infection. Orthopedic Exam: Muscle tone and strength are WNL. No limitations in general ROM. No crepitus or effusions noted. Foot type and digits show no abnormalities. Bony prominences are unremarkable. Skin:  Porokeratosis sub 2  B/L and sub 4,5 right  foot.   No infection or ulcers.  Symptomatic heel callus.  Diagnosis:  Onychomycosis, , Pain in right toe, pain in left toes.  Porokeratosis  B/L  Treatment & Plan Procedures and Treatment: Consent by patient was obtained for treatment procedures. The patient understood the discussion of treatment and procedures well. All questions were answered thoroughly reviewed. Debridement of mycotic and hypertrophic toenails, 1 through 5 bilateral and clearing of subungual debris. No ulceration, no infection noted. Debride porokeratosis  B/L  Debride heel callus. Return Visit-Office Procedure: Patient instructed to return to the office for a follow up visit 3 months for continued evaluation and treatment.    Gardiner Barefoot DPM

## 2019-07-28 ENCOUNTER — Other Ambulatory Visit: Payer: Self-pay | Admitting: Internal Medicine

## 2019-07-28 DIAGNOSIS — Z1231 Encounter for screening mammogram for malignant neoplasm of breast: Secondary | ICD-10-CM

## 2019-08-13 ENCOUNTER — Encounter: Payer: Self-pay | Admitting: Podiatry

## 2019-08-13 ENCOUNTER — Ambulatory Visit (INDEPENDENT_AMBULATORY_CARE_PROVIDER_SITE_OTHER): Payer: Medicare Other | Admitting: Podiatry

## 2019-08-13 ENCOUNTER — Other Ambulatory Visit: Payer: Self-pay

## 2019-08-13 DIAGNOSIS — Q828 Other specified congenital malformations of skin: Secondary | ICD-10-CM | POA: Diagnosis not present

## 2019-08-13 DIAGNOSIS — B351 Tinea unguium: Secondary | ICD-10-CM | POA: Diagnosis not present

## 2019-08-13 DIAGNOSIS — E109 Type 1 diabetes mellitus without complications: Secondary | ICD-10-CM

## 2019-08-13 DIAGNOSIS — M79674 Pain in right toe(s): Secondary | ICD-10-CM

## 2019-08-13 DIAGNOSIS — M79675 Pain in left toe(s): Secondary | ICD-10-CM

## 2019-08-13 NOTE — Progress Notes (Signed)
Complaint:  Visit Type: Patient returns to my office for continued preventative foot care services. Complaint: Patient states" my nails have grown long and thick and become painful to walk and wear shoes" Patient has been diagnosed with DM with no foot complications. The patient presents for preventative foot care services. No changes to ROS.  She has painful callus both feet.  Podiatric Exam: Vascular: dorsalis pedis and posterior tibial pulses are palpable bilateral. Capillary return is immediate. Temperature gradient is WNL. Skin turgor WNL  Sensorium: Normal Semmes Weinstein monofilament test. Normal tactile sensation bilaterally. Nail Exam: Pt has thick disfigured discolored nails with subungual debris noted bilateral entire nail hallux through fifth toenails Ulcer Exam: There is no evidence of ulcer or pre-ulcerative changes or infection. Orthopedic Exam: Muscle tone and strength are WNL. No limitations in general ROM. No crepitus or effusions noted. Foot type and digits show no abnormalities. Bony prominences are unremarkable. Skin:  Porokeratosis sub 2  B/L and sub 4,5 right  foot.   No infection or ulcers.  Asymptomatic heel callus.  Diagnosis:  Onychomycosis, , Pain in right toe, pain in left toes.  Porokeratosis  B/L  Treatment & Plan Procedures and Treatment: Consent by patient was obtained for treatment procedures. The patient understood the discussion of treatment and procedures well. All questions were answered thoroughly reviewed. Debridement of mycotic and hypertrophic toenails, 1 through 5 bilateral and clearing of subungual debris. No ulceration, no infection noted. Debride porokeratosis  B/L   Return Visit-Office Procedure: Patient instructed to return to the office for a follow up visit 3 months for continued evaluation and treatment.    Gardiner Barefoot DPM

## 2019-08-17 ENCOUNTER — Inpatient Hospital Stay: Payer: Medicare Other

## 2019-08-17 ENCOUNTER — Inpatient Hospital Stay: Payer: Medicare Other | Admitting: Hematology and Oncology

## 2019-08-21 NOTE — Progress Notes (Signed)
Per the husband, patient was unable to come to the phone at this time.  Made him aware of the appointments.

## 2019-08-24 ENCOUNTER — Inpatient Hospital Stay (HOSPITAL_BASED_OUTPATIENT_CLINIC_OR_DEPARTMENT_OTHER): Payer: Medicare Other | Admitting: Oncology

## 2019-08-24 ENCOUNTER — Other Ambulatory Visit: Payer: Self-pay

## 2019-08-24 ENCOUNTER — Encounter: Payer: Self-pay | Admitting: Oncology

## 2019-08-24 ENCOUNTER — Inpatient Hospital Stay: Payer: Medicare Other | Attending: Oncology

## 2019-08-24 ENCOUNTER — Inpatient Hospital Stay: Payer: Medicare Other

## 2019-08-24 VITALS — BP 159/49 | HR 73 | Temp 97.9°F | Wt 109.0 lb

## 2019-08-24 DIAGNOSIS — Z794 Long term (current) use of insulin: Secondary | ICD-10-CM | POA: Diagnosis not present

## 2019-08-24 DIAGNOSIS — Z79899 Other long term (current) drug therapy: Secondary | ICD-10-CM | POA: Insufficient documentation

## 2019-08-24 DIAGNOSIS — E119 Type 2 diabetes mellitus without complications: Secondary | ICD-10-CM | POA: Diagnosis not present

## 2019-08-24 DIAGNOSIS — E7800 Pure hypercholesterolemia, unspecified: Secondary | ICD-10-CM | POA: Insufficient documentation

## 2019-08-24 DIAGNOSIS — C50911 Malignant neoplasm of unspecified site of right female breast: Secondary | ICD-10-CM | POA: Insufficient documentation

## 2019-08-24 DIAGNOSIS — I10 Essential (primary) hypertension: Secondary | ICD-10-CM | POA: Insufficient documentation

## 2019-08-24 DIAGNOSIS — M810 Age-related osteoporosis without current pathological fracture: Secondary | ICD-10-CM

## 2019-08-24 DIAGNOSIS — Z79810 Long term (current) use of selective estrogen receptor modulators (SERMs): Secondary | ICD-10-CM | POA: Diagnosis not present

## 2019-08-24 DIAGNOSIS — Z90722 Acquired absence of ovaries, bilateral: Secondary | ICD-10-CM | POA: Diagnosis not present

## 2019-08-24 DIAGNOSIS — Z9079 Acquired absence of other genital organ(s): Secondary | ICD-10-CM | POA: Insufficient documentation

## 2019-08-24 DIAGNOSIS — E039 Hypothyroidism, unspecified: Secondary | ICD-10-CM | POA: Insufficient documentation

## 2019-08-24 DIAGNOSIS — Z9011 Acquired absence of right breast and nipple: Secondary | ICD-10-CM | POA: Diagnosis not present

## 2019-08-24 DIAGNOSIS — Z7983 Long term (current) use of bisphosphonates: Secondary | ICD-10-CM

## 2019-08-24 DIAGNOSIS — Z90710 Acquired absence of both cervix and uterus: Secondary | ICD-10-CM | POA: Insufficient documentation

## 2019-08-24 DIAGNOSIS — Z08 Encounter for follow-up examination after completed treatment for malignant neoplasm: Secondary | ICD-10-CM | POA: Diagnosis not present

## 2019-08-24 DIAGNOSIS — Z853 Personal history of malignant neoplasm of breast: Secondary | ICD-10-CM | POA: Diagnosis not present

## 2019-08-24 LAB — CBC WITH DIFFERENTIAL/PLATELET
Abs Immature Granulocytes: 0.02 10*3/uL (ref 0.00–0.07)
Basophils Absolute: 0 10*3/uL (ref 0.0–0.1)
Basophils Relative: 1 %
Eosinophils Absolute: 0.1 10*3/uL (ref 0.0–0.5)
Eosinophils Relative: 2 %
HCT: 36.6 % (ref 36.0–46.0)
Hemoglobin: 12 g/dL (ref 12.0–15.0)
Immature Granulocytes: 0 %
Lymphocytes Relative: 34 %
Lymphs Abs: 2.3 10*3/uL (ref 0.7–4.0)
MCH: 32.3 pg (ref 26.0–34.0)
MCHC: 32.8 g/dL (ref 30.0–36.0)
MCV: 98.7 fL (ref 80.0–100.0)
Monocytes Absolute: 0.8 10*3/uL (ref 0.1–1.0)
Monocytes Relative: 12 %
Neutro Abs: 3.5 10*3/uL (ref 1.7–7.7)
Neutrophils Relative %: 51 %
Platelets: 190 10*3/uL (ref 150–400)
RBC: 3.71 MIL/uL — ABNORMAL LOW (ref 3.87–5.11)
RDW: 13.1 % (ref 11.5–15.5)
WBC: 6.8 10*3/uL (ref 4.0–10.5)
nRBC: 0 % (ref 0.0–0.2)

## 2019-08-24 LAB — COMPREHENSIVE METABOLIC PANEL
ALT: 10 U/L (ref 0–44)
AST: 24 U/L (ref 15–41)
Albumin: 3.6 g/dL (ref 3.5–5.0)
Alkaline Phosphatase: 21 U/L — ABNORMAL LOW (ref 38–126)
Anion gap: 8 (ref 5–15)
BUN: 14 mg/dL (ref 8–23)
CO2: 27 mmol/L (ref 22–32)
Calcium: 8.5 mg/dL — ABNORMAL LOW (ref 8.9–10.3)
Chloride: 104 mmol/L (ref 98–111)
Creatinine, Ser: 0.97 mg/dL (ref 0.44–1.00)
GFR calc Af Amer: >60 mL/min (ref >60)
GFR calc non Af Amer: 55 mL/min — ABNORMAL LOW (ref >60)
Glucose, Bld: 102 mg/dL — ABNORMAL HIGH (ref 70–99)
Potassium: 4.4 mmol/L (ref 3.5–5.1)
Sodium: 139 mmol/L (ref 135–145)
Total Bilirubin: 0.4 mg/dL (ref 0.3–1.2)
Total Protein: 6 g/dL — ABNORMAL LOW (ref 6.5–8.1)

## 2019-08-24 MED ORDER — DENOSUMAB 60 MG/ML ~~LOC~~ SOSY
60.0000 mg | PREFILLED_SYRINGE | Freq: Once | SUBCUTANEOUS | Status: AC
  Administered 2019-08-24: 12:00:00 60 mg via SUBCUTANEOUS
  Filled 2019-08-24: qty 1

## 2019-08-24 NOTE — Progress Notes (Signed)
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  Oncotype DX score was 31 but patient did not receive any chemotherapy or radiation.  She started taking tamoxifen in August 2011.  She does have baseline osteoporosis and has been on Prolia every 6 months.  Interval history-she is doing well and has not had any major health concerns over the last 1 year.  Appetite and weight are stable.  Denies any new aches or pains anywhere  ECOG PS- 1 Pain scale- 0   Review of systems- Review of Systems  Constitutional: Negative for chills, fever, malaise/fatigue and weight loss.  HENT: Negative for congestion, ear discharge and nosebleeds.   Eyes: Negative for blurred vision.  Respiratory: Negative for cough, hemoptysis, sputum production, shortness of breath and wheezing.   Cardiovascular: Negative for chest pain, palpitations, orthopnea and claudication.  Gastrointestinal: Negative for abdominal pain, blood in stool, constipation, diarrhea, heartburn, melena, nausea and vomiting.  Genitourinary: Negative for dysuria, flank pain, frequency, hematuria and urgency.  Musculoskeletal: Negative for back pain, joint pain and myalgias.  Skin: Negative for rash.  Neurological: Negative for dizziness, tingling, focal weakness, seizures, weakness and headaches.  Endo/Heme/Allergies: Does not bruise/bleed easily.  Psychiatric/Behavioral:  Negative for depression and suicidal ideas. The patient does not have insomnia.       Allergies  Allergen Reactions  . Lisinopril Rash and Other (See Comments)    Hyponatremia  Hyponatremia  Hyponatremia   . Sulfa Antibiotics Other (See Comments)    possible possible Patient doesn't know   . Sulfasalazine      Past Medical History:  Diagnosis Date  . Breast cancer ([place]) 2011   RT MASTECTOMY  . Cancer ([place]) 2011   BREAST CA  . diabetes insulin dep   . High cholesterol   . Hypertension   . Hypothyroidism   . Osteoporosis   . Thyroid disease      Past Surgical History:  Procedure Laterality Date  . BREAST BIOPSY Right 2011   positive  . CATARACT EXTRACTION W/PHACO Right 06/13/2016   Procedure: CATARACT EXTRACTION PHACO AND INTRAOCULAR LENS PLACEMENT (IOC);  Surgeon: Estill Cotta, MD;  Location: [facility] ORS;  Service: Ophthalmology;  Laterality: Right;  [place] 03:07 AP% 27.4 CDE 93.21 fluid pack lot # [id number]  . MASTECTOMY Right 2011   positive  . TONSILLECTOMY    . TOTAL ABDOMINAL HYSTERECTOMY      Social History   Socioeconomic History  . Marital status: Married    Spouse name: Not on file  . Number of children: Not on file  . Years of education: Not on file  . Highest education level: Not on file  Occupational History  . Not on file  Social Needs  . Financial resource strain: Not on file  . Food insecurity    Worry: Not on file    Inability: Not on file  . Transportation needs    Medical: Not on file    Non-medical: Not on  file  Tobacco Use  . Smoking status: Never Smoker  . Smokeless tobacco: Never Used  Substance and Sexual Activity  . Alcohol use: No  . Drug use: No  . Sexual activity: Not on file  Lifestyle  . Physical activity    Days per week: Not on file    Minutes per session: Not on file  . Stress: Not on file  Relationships  . Social Herbalist on phone: Not on file    Gets together: Not on file    Attends religious  service: Not on file    Active member of club or organization: Not on file    Attends meetings of clubs or organizations: Not on file    Relationship status: Not on file  . Intimate partner violence    Fear of current or ex partner: Not on file    Emotionally abused: Not on file    Physically abused: Not on file    Forced sexual activity: Not on file  Other Topics Concern  . Not on file  Social History Narrative  . Not on file    Family History  Problem Relation Age of Onset  . Heart disease Father        heart attack  . Breast cancer Neg Hx      Current Outpatient Medications:  .  amLODipine (NORVASC) 2.5 MG tablet, , Disp: , Rfl:  .  Calcium Carbonate-Vitamin D (OYSTER SHELL CALCIUM 500 + D) 500-125 MG-UNIT TABS, Take by mouth., Disp: , Rfl:  .  denosumab (PROLIA) 60 MG/ML SOLN injection, Inject 60 mg into the skin every 6 (six) months., Disp: , Rfl:  .  enalapril (VASOTEC) 20 MG tablet, TAKE 1 TABLET TWICE A DAY, Disp: , Rfl:  .  insulin aspart (NOVOLOG) 100 UNIT/ML injection, Inject 0-9 Units into the skin 4 (four) times daily., Disp: 10 mL, Rfl: 11 .  insulin glargine (LANTUS) 100 UNIT/ML injection, Inject 0.1 mLs (10 Units total) into the skin daily., Disp: 10 mL, Rfl: 11 .  insulin lispro (HUMALOG) 100 UNIT/ML injection, INJECT 2 TO 5 UNITS UNDER THE SKIN THREE TIMES A DAY WITH MEALS, Disp: , Rfl:  .  INSULIN SYRINGE .5CC/29G (B-D INS SYR ULTRAFINE .5CC/29G) 29G X 1/2" 0.5 ML MISC, Use four times daily, Disp: , Rfl:  .  lovastatin (MEVACOR) 20 MG tablet, TAKE 1 TABLET DAILY, Disp: , Rfl:  .  RELION PRIME TEST test strip, USE 1 STRIP TO CHECK GLUCOSE THREE TIMES DAILY, Disp: , Rfl:  .  SYNTHROID 25 MCG tablet, , Disp: , Rfl:  .  tamoxifen (NOLVADEX) 20 MG tablet, TAKE 1 TABLET DAILY, Disp: 90 tablet, Rfl: 4 .  UNABLE TO FIND, Take by mouth., Disp: , Rfl:  .  brimonidine (ALPHAGAN) 0.2 % ophthalmic solution, Place 1 drop into the right eye 3 (three) times daily. (Patient not  taking: Reported on 08/24/2019), Disp: 5 mL, Rfl: 1 .  dorzolamide-timolol (COSOPT) 22.3-6.8 MG/ML ophthalmic solution, Place 1 drop into the right eye 2 (two) times daily. (Patient not taking: Reported on 08/24/2019), Disp: 10 mL, Rfl: 1 .  DUREZOL 0.05 % EMUL, Place 1 drop into the right eye every other day. , Disp: , Rfl:  .  moxifloxacin (VIGAMOX) 0.5 % ophthalmic solution, Place 1 drop into the right eye 4 (four) times daily. (Patient not taking: Reported on 08/24/2019), Disp: 3 mL, Rfl: 1  Physical exam:  Vitals:   08/24/19 1053  BP: (!) 159/49  Pulse: 73  Temp: 97.9 F (36.6 C)  TempSrc: Tympanic  Weight: 109 lb (49.4 kg)   Physical Exam Constitutional:      Comments: Thin elderly frail woman in no acute distress  HENT:     Head: Normocephalic and atraumatic.  Eyes:     Pupils: Pupils are equal, round, and reactive to light.  Neck:     Musculoskeletal: Normal range of motion.  Cardiovascular:     Rate and Rhythm: Normal rate and regular rhythm.     Heart sounds: Normal heart sounds.  Pulmonary:     Effort: Pulmonary effort is normal.     Breath sounds: Normal breath sounds.  Abdominal:     General: Bowel sounds are normal.     Palpations: Abdomen is soft.  Skin:    General: Skin is warm and dry.  Neurological:     Mental Status: She is alert and oriented to person, place, and time.   Patient is status post right mastectomy without reconstruction.  No evidence of chest wall recurrencce.  No palpable bilateral axillary adenopathy.  No palpable masses in the left breast  CMP Latest Ref Rng & Units 08/24/2019  Glucose 70 - 99 mg/dL 102(H)  BUN 8 - 23 mg/dL 14  Creatinine 0.44 - 1.00 mg/dL 0.97  Sodium 135 - 145 mmol/L 139  Potassium 3.5 - 5.1 mmol/L 4.4  Chloride 98 - 111 mmol/L 104  CO2 22 - 32 mmol/L 27  Calcium 8.9 - 10.3 mg/dL 8.5(L)  Total Protein 6.5 - 8.1 g/dL 6.0(L)  Total Bilirubin 0.3 - 1.2 mg/dL 0.4  Alkaline Phos 38 - 126 U/L 21(L)  AST 15 - 41 U/L 24   ALT 0 - 44 U/L 10   CBC Latest Ref Rng & Units 08/24/2019  WBC 4.0 - 10.5 K/uL 6.8  Hemoglobin 12.0 - 15.0 g/dL 12.0  Hematocrit 36.0 - 46.0 % 36.6  Platelets 150 - 400 K/uL 190      Assessment and plan- Patient is a 78 y.o. female with stage II right breast cancer in 2011 s/p mastectomy and currently on tamoxifen  Patient will continue tamoxifen for 1 more year and finished 10 years in August 2021.  Clinically she is doing well and no concerning signs and symptoms of recurrence based on today's exam.  She is due for a mammogram later this month.  Osteoporosis: She will receive Prolia today.  CMP and Prolia in 6 months.  CMP and Prolia and see Dr. Janese Banks in 1 year.  I will also get a repeat bone density scan at this time.  No role for routine tumor marker testing advanced cancer   Visit Diagnosis 1. Osteoporosis without current pathological fracture, unspecified osteoporosis type   2. Encounter for follow-up surveillance of breast cancer   3. Long term (current) use of bisphosphonates      Dr. Randa Evens, MD, MPH [facility] [id number] 08/24/2019 11:59 AM

## 2019-08-24 NOTE — Progress Notes (Signed)
Patient stated that she had been doing well with no complaints. 

## 2019-08-25 LAB — CANCER ANTIGEN 27.29: CA 27.29: 16.2 U/mL (ref 0.0–38.6)

## 2019-09-02 ENCOUNTER — Ambulatory Visit
Admission: RE | Admit: 2019-09-02 | Discharge: 2019-09-02 | Disposition: A | Discharge status: Home / Self Care | Payer: Medicare Other | Source: Ambulatory Visit | Attending: Internal Medicine | Admitting: Internal Medicine

## 2019-09-02 ENCOUNTER — Ambulatory Visit
Admission: RE | Admit: 2019-09-02 | Discharge: 2019-09-02 | Disposition: A | Discharge status: Home / Self Care | Payer: Medicare Other | Source: Ambulatory Visit | Attending: Oncology | Admitting: Oncology

## 2019-09-02 DIAGNOSIS — M810 Age-related osteoporosis without current pathological fracture: Secondary | ICD-10-CM

## 2019-09-02 DIAGNOSIS — Z1231 Encounter for screening mammogram for malignant neoplasm of breast: Secondary | ICD-10-CM | POA: Insufficient documentation

## 2019-10-08 ENCOUNTER — Encounter: Payer: Medicare Other | Admitting: Podiatry

## 2019-10-08 ENCOUNTER — Other Ambulatory Visit: Payer: Self-pay

## 2019-10-08 ENCOUNTER — Encounter: Payer: Self-pay | Admitting: Podiatry

## 2019-10-09 NOTE — Patient Instructions (Signed)
error 

## 2019-10-09 NOTE — Progress Notes (Signed)
Patient came too early for medicare coverage.  Gardiner Barefoot DPM

## 2019-10-09 NOTE — Progress Notes (Signed)
error 

## 2019-10-09 NOTE — Progress Notes (Signed)
This encounter was created in error - please disregard.

## 2019-11-05 ENCOUNTER — Ambulatory Visit: Payer: Medicare Other | Admitting: Podiatry

## 2019-11-05 ENCOUNTER — Other Ambulatory Visit: Payer: Self-pay

## 2019-11-05 ENCOUNTER — Ambulatory Visit (INDEPENDENT_AMBULATORY_CARE_PROVIDER_SITE_OTHER): Payer: Medicare Other | Admitting: Podiatry

## 2019-11-05 ENCOUNTER — Encounter: Payer: Self-pay | Admitting: Podiatry

## 2019-11-05 DIAGNOSIS — B351 Tinea unguium: Secondary | ICD-10-CM

## 2019-11-05 DIAGNOSIS — M79674 Pain in right toe(s): Secondary | ICD-10-CM | POA: Diagnosis not present

## 2019-11-05 DIAGNOSIS — Q828 Other specified congenital malformations of skin: Secondary | ICD-10-CM | POA: Diagnosis not present

## 2019-11-05 DIAGNOSIS — E109 Type 1 diabetes mellitus without complications: Secondary | ICD-10-CM | POA: Diagnosis not present

## 2019-11-05 DIAGNOSIS — M79675 Pain in left toe(s): Secondary | ICD-10-CM | POA: Diagnosis not present

## 2019-11-05 NOTE — Progress Notes (Signed)
Complaint:  Visit Type: Patient returns to my office for continued preventative foot care services. Complaint: Patient states" my nails have grown long and thick and become painful to walk and wear shoes" Patient has been diagnosed with DM with no foot complications. The patient presents for preventative foot care services. No changes to ROS.  She has painful callus both feet.  Podiatric Exam: Vascular: dorsalis pedis and posterior tibial pulses are palpable bilateral. Capillary return is immediate. Temperature gradient is WNL. Skin turgor WNL  Sensorium: Normal Semmes Weinstein monofilament test. Normal tactile sensation bilaterally. Nail Exam: Pt has thick disfigured discolored nails with subungual debris noted bilateral entire nail hallux through fifth toenails Ulcer Exam: There is no evidence of ulcer or pre-ulcerative changes or infection. Orthopedic Exam: Muscle tone and strength are WNL. No limitations in general ROM. No crepitus or effusions noted. Foot type and digits show no abnormalities. Bony prominences are unremarkable. Skin:  Porokeratosis sub 2  B/L and sub 4,5 right  foot.   No infection or ulcers.  Asymptomatic heel callus.  Diagnosis:  Onychomycosis, , Pain in right toe, pain in left toes.  Porokeratosis  B/L  Treatment & Plan Procedures and Treatment: Consent by patient was obtained for treatment procedures. The patient understood the discussion of treatment and procedures well. All questions were answered thoroughly reviewed. Debridement of mycotic and hypertrophic toenails, 1 through 5 bilateral and clearing of subungual debris. No ulceration, no infection noted. Debride porokeratosis  B/L   Return Visit-Office Procedure: Patient instructed to return to the office for a follow up visit 3 months for continued evaluation and treatment.    Gardiner Barefoot DPM

## 2019-12-04 DIAGNOSIS — E109 Type 1 diabetes mellitus without complications: Secondary | ICD-10-CM | POA: Diagnosis not present

## 2019-12-11 DIAGNOSIS — Z794 Long term (current) use of insulin: Secondary | ICD-10-CM | POA: Diagnosis not present

## 2019-12-11 DIAGNOSIS — E109 Type 1 diabetes mellitus without complications: Secondary | ICD-10-CM | POA: Diagnosis not present

## 2020-01-07 ENCOUNTER — Ambulatory Visit: Payer: Medicare Other | Admitting: Podiatry

## 2020-02-03 DIAGNOSIS — H02055 Trichiasis without entropian left lower eyelid: Secondary | ICD-10-CM | POA: Diagnosis not present

## 2020-02-08 ENCOUNTER — Encounter: Payer: Self-pay | Admitting: Podiatry

## 2020-02-08 ENCOUNTER — Other Ambulatory Visit: Payer: Self-pay

## 2020-02-08 ENCOUNTER — Ambulatory Visit (INDEPENDENT_AMBULATORY_CARE_PROVIDER_SITE_OTHER): Payer: PPO | Admitting: Podiatry

## 2020-02-08 VITALS — Temp 98.1°F

## 2020-02-08 DIAGNOSIS — M79675 Pain in left toe(s): Secondary | ICD-10-CM | POA: Diagnosis not present

## 2020-02-08 DIAGNOSIS — E1010 Type 1 diabetes mellitus with ketoacidosis without coma: Secondary | ICD-10-CM | POA: Diagnosis not present

## 2020-02-08 DIAGNOSIS — Q828 Other specified congenital malformations of skin: Secondary | ICD-10-CM

## 2020-02-08 DIAGNOSIS — B351 Tinea unguium: Secondary | ICD-10-CM | POA: Diagnosis not present

## 2020-02-08 DIAGNOSIS — M79674 Pain in right toe(s): Secondary | ICD-10-CM | POA: Diagnosis not present

## 2020-02-08 NOTE — Progress Notes (Signed)
This patient returns to my office for at risk foot care.  This patient requires this care by a professional since this patient will be at risk due to having  Diabetes.    This patient is unable to cut nails herself since the patient cannot reach her nails.These nails are painful walking and wearing shoes.  This patient presents for at risk foot care today.  Patient has multiple calluses both feet.  General Appearance  Alert, conversant and in no acute stress.  Vascular  Dorsalis pedis and posterior tibial  pulses are palpable  bilaterally.  Capillary return is within normal limits  bilaterally. Temperature is within normal limits  bilaterally.  Neurologic  Senn-Weinstein monofilament wire test within normal limits  bilaterally. Muscle power within normal limits bilaterally.  Nails Thick disfigured discolored nails with subungual debris  from hallux to fifth toes bilaterally. No evidence of bacterial infection or drainage bilaterally.  Orthopedic  No limitations of motion  feet .  No crepitus or effusions noted.  No bony pathology or digital deformities noted.  Skin  normotropic skin with no porokeratosis noted bilaterally.  No signs of infections or ulcers noted.  Porokeratosis sub 2  B/L and sub 4,5 right foot.   Onychomycosis  Pain in right toes  Pain in left toes  Porokeratosis  B/L.  Consent was obtained for treatment procedures.   Mechanical debridement of nails 1-5  bilaterally performed with a nail nipper.  Filed with dremel without incident. No infection or ulcer.  Debridement of callus  B/L with # 15 blade.   Return office visit    3 months                  Told patient to return for periodic foot care and evaluation due to potential at risk complications.   Oda Placke DPM  

## 2020-02-22 ENCOUNTER — Inpatient Hospital Stay: Payer: PPO | Attending: Oncology

## 2020-02-22 ENCOUNTER — Inpatient Hospital Stay: Payer: PPO

## 2020-02-22 ENCOUNTER — Other Ambulatory Visit: Payer: Self-pay

## 2020-02-22 DIAGNOSIS — C50911 Malignant neoplasm of unspecified site of right female breast: Secondary | ICD-10-CM | POA: Diagnosis not present

## 2020-02-22 DIAGNOSIS — Z9011 Acquired absence of right breast and nipple: Secondary | ICD-10-CM | POA: Insufficient documentation

## 2020-02-22 DIAGNOSIS — Z79810 Long term (current) use of selective estrogen receptor modulators (SERMs): Secondary | ICD-10-CM | POA: Insufficient documentation

## 2020-02-22 DIAGNOSIS — M810 Age-related osteoporosis without current pathological fracture: Secondary | ICD-10-CM | POA: Diagnosis not present

## 2020-02-22 DIAGNOSIS — Z170 Estrogen receptor positive status [ER+]: Secondary | ICD-10-CM | POA: Insufficient documentation

## 2020-02-22 LAB — COMPREHENSIVE METABOLIC PANEL
ALT: 11 U/L (ref 0–44)
AST: 25 U/L (ref 15–41)
Albumin: 3.9 g/dL (ref 3.5–5.0)
Alkaline Phosphatase: 23 U/L — ABNORMAL LOW (ref 38–126)
Anion gap: 9 (ref 5–15)
BUN: 17 mg/dL (ref 8–23)
CO2: 26 mmol/L (ref 22–32)
Calcium: 9 mg/dL (ref 8.9–10.3)
Chloride: 102 mmol/L (ref 98–111)
Creatinine, Ser: 0.91 mg/dL (ref 0.44–1.00)
GFR calc Af Amer: >60 mL/min (ref >60)
GFR calc non Af Amer: 60 mL/min — ABNORMAL LOW (ref >60)
Glucose, Bld: 153 mg/dL — ABNORMAL HIGH (ref 70–99)
Potassium: 4.5 mmol/L (ref 3.5–5.1)
Sodium: 137 mmol/L (ref 135–145)
Total Bilirubin: 0.6 mg/dL (ref 0.3–1.2)
Total Protein: 6.6 g/dL (ref 6.5–8.1)

## 2020-02-22 MED ORDER — DENOSUMAB 60 MG/ML ~~LOC~~ SOSY
60.0000 mg | PREFILLED_SYRINGE | Freq: Once | SUBCUTANEOUS | Status: AC
  Administered 2020-02-22: 60 mg via SUBCUTANEOUS
  Filled 2020-02-22: qty 1

## 2020-02-25 DIAGNOSIS — Z0000 Encounter for general adult medical examination without abnormal findings: Secondary | ICD-10-CM | POA: Diagnosis not present

## 2020-02-25 DIAGNOSIS — E039 Hypothyroidism, unspecified: Secondary | ICD-10-CM | POA: Diagnosis not present

## 2020-02-25 DIAGNOSIS — I10 Essential (primary) hypertension: Secondary | ICD-10-CM | POA: Diagnosis not present

## 2020-02-25 DIAGNOSIS — R413 Other amnesia: Secondary | ICD-10-CM | POA: Diagnosis not present

## 2020-02-25 DIAGNOSIS — C50919 Malignant neoplasm of unspecified site of unspecified female breast: Secondary | ICD-10-CM | POA: Diagnosis not present

## 2020-02-25 DIAGNOSIS — G9340 Encephalopathy, unspecified: Secondary | ICD-10-CM | POA: Diagnosis not present

## 2020-02-25 DIAGNOSIS — E7800 Pure hypercholesterolemia, unspecified: Secondary | ICD-10-CM | POA: Diagnosis not present

## 2020-02-25 DIAGNOSIS — E109 Type 1 diabetes mellitus without complications: Secondary | ICD-10-CM | POA: Diagnosis not present

## 2020-03-09 DIAGNOSIS — E109 Type 1 diabetes mellitus without complications: Secondary | ICD-10-CM | POA: Diagnosis not present

## 2020-03-16 DIAGNOSIS — E109 Type 1 diabetes mellitus without complications: Secondary | ICD-10-CM | POA: Diagnosis not present

## 2020-03-16 DIAGNOSIS — Z794 Long term (current) use of insulin: Secondary | ICD-10-CM | POA: Diagnosis not present

## 2020-05-12 ENCOUNTER — Encounter: Payer: Self-pay | Admitting: Podiatry

## 2020-05-12 ENCOUNTER — Ambulatory Visit (INDEPENDENT_AMBULATORY_CARE_PROVIDER_SITE_OTHER): Payer: PPO | Admitting: Podiatry

## 2020-05-12 ENCOUNTER — Other Ambulatory Visit: Payer: Self-pay

## 2020-05-12 DIAGNOSIS — Q828 Other specified congenital malformations of skin: Secondary | ICD-10-CM

## 2020-05-12 DIAGNOSIS — L851 Acquired keratosis [keratoderma] palmaris et plantaris: Secondary | ICD-10-CM

## 2020-05-12 DIAGNOSIS — M79675 Pain in left toe(s): Secondary | ICD-10-CM | POA: Diagnosis not present

## 2020-05-12 DIAGNOSIS — B351 Tinea unguium: Secondary | ICD-10-CM

## 2020-05-12 DIAGNOSIS — E109 Type 1 diabetes mellitus without complications: Secondary | ICD-10-CM

## 2020-05-12 DIAGNOSIS — M79674 Pain in right toe(s): Secondary | ICD-10-CM | POA: Diagnosis not present

## 2020-05-12 NOTE — Progress Notes (Signed)
This patient returns to my office for at risk foot care.  This patient requires this care by a professional since this patient will be at risk due to having  Diabetes.    This patient is unable to cut nails herself since the patient cannot reach her nails.These nails are painful walking and wearing shoes.  This patient presents for at risk foot care today.  Patient has multiple calluses both feet.  General Appearance  Alert, conversant and in no acute stress.  Vascular  Dorsalis pedis and posterior tibial  pulses are palpable  bilaterally.  Capillary return is within normal limits  bilaterally. Temperature is within normal limits  bilaterally.  Neurologic  Senn-Weinstein monofilament wire test within normal limits  bilaterally. Muscle power within normal limits bilaterally.  Nails Thick disfigured discolored nails with subungual debris  from hallux to fifth toes bilaterally. No evidence of bacterial infection or drainage bilaterally.  Orthopedic  No limitations of motion  feet .  No crepitus or effusions noted.  No bony pathology or digital deformities noted.  Skin  normotropic skin with no porokeratosis noted bilaterally.  No signs of infections or ulcers noted.  Porokeratosis sub 2  B/L and sub 4,5 right foot.   Onychomycosis  Pain in right toes  Pain in left toes  Porokeratosis  B/L.  Consent was obtained for treatment procedures.   Mechanical debridement of nails 1-5  bilaterally performed with a nail nipper.  Filed with dremel without incident. No infection or ulcer.  Debridement of callus  B/L with # 15 blade.   Return office visit    3 months                  Told patient to return for periodic foot care and evaluation due to potential at risk complications.   Gardiner Barefoot DPM

## 2020-06-15 DIAGNOSIS — E109 Type 1 diabetes mellitus without complications: Secondary | ICD-10-CM | POA: Diagnosis not present

## 2020-06-22 DIAGNOSIS — E1065 Type 1 diabetes mellitus with hyperglycemia: Secondary | ICD-10-CM | POA: Diagnosis not present

## 2020-06-22 DIAGNOSIS — E10649 Type 1 diabetes mellitus with hypoglycemia without coma: Secondary | ICD-10-CM | POA: Diagnosis not present

## 2020-06-22 DIAGNOSIS — Z794 Long term (current) use of insulin: Secondary | ICD-10-CM | POA: Diagnosis not present

## 2020-06-29 DIAGNOSIS — E039 Hypothyroidism, unspecified: Secondary | ICD-10-CM | POA: Diagnosis not present

## 2020-06-29 DIAGNOSIS — R413 Other amnesia: Secondary | ICD-10-CM | POA: Diagnosis not present

## 2020-06-29 DIAGNOSIS — I10 Essential (primary) hypertension: Secondary | ICD-10-CM | POA: Diagnosis not present

## 2020-06-29 DIAGNOSIS — C50919 Malignant neoplasm of unspecified site of unspecified female breast: Secondary | ICD-10-CM | POA: Diagnosis not present

## 2020-06-29 DIAGNOSIS — G9340 Encephalopathy, unspecified: Secondary | ICD-10-CM | POA: Diagnosis not present

## 2020-06-29 DIAGNOSIS — E109 Type 1 diabetes mellitus without complications: Secondary | ICD-10-CM | POA: Diagnosis not present

## 2020-06-29 DIAGNOSIS — R8290 Unspecified abnormal findings in urine: Secondary | ICD-10-CM | POA: Diagnosis not present

## 2020-06-29 DIAGNOSIS — E7800 Pure hypercholesterolemia, unspecified: Secondary | ICD-10-CM | POA: Diagnosis not present

## 2020-07-06 DIAGNOSIS — E039 Hypothyroidism, unspecified: Secondary | ICD-10-CM | POA: Diagnosis not present

## 2020-07-06 DIAGNOSIS — C50919 Malignant neoplasm of unspecified site of unspecified female breast: Secondary | ICD-10-CM | POA: Diagnosis not present

## 2020-07-06 DIAGNOSIS — Z0000 Encounter for general adult medical examination without abnormal findings: Secondary | ICD-10-CM | POA: Diagnosis not present

## 2020-07-06 DIAGNOSIS — I10 Essential (primary) hypertension: Secondary | ICD-10-CM | POA: Diagnosis not present

## 2020-07-06 DIAGNOSIS — E109 Type 1 diabetes mellitus without complications: Secondary | ICD-10-CM | POA: Diagnosis not present

## 2020-07-27 ENCOUNTER — Other Ambulatory Visit: Payer: Self-pay | Admitting: Internal Medicine

## 2020-07-27 DIAGNOSIS — Z1231 Encounter for screening mammogram for malignant neoplasm of breast: Secondary | ICD-10-CM

## 2020-08-15 ENCOUNTER — Other Ambulatory Visit: Payer: Self-pay

## 2020-08-15 ENCOUNTER — Encounter: Payer: Self-pay | Admitting: Podiatry

## 2020-08-15 ENCOUNTER — Ambulatory Visit: Payer: PPO | Admitting: Podiatry

## 2020-08-15 DIAGNOSIS — E109 Type 1 diabetes mellitus without complications: Secondary | ICD-10-CM | POA: Diagnosis not present

## 2020-08-15 DIAGNOSIS — Q828 Other specified congenital malformations of skin: Secondary | ICD-10-CM

## 2020-08-15 DIAGNOSIS — B351 Tinea unguium: Secondary | ICD-10-CM

## 2020-08-15 DIAGNOSIS — M79675 Pain in left toe(s): Secondary | ICD-10-CM | POA: Diagnosis not present

## 2020-08-15 DIAGNOSIS — M79674 Pain in right toe(s): Secondary | ICD-10-CM

## 2020-08-15 DIAGNOSIS — N179 Acute kidney failure, unspecified: Secondary | ICD-10-CM

## 2020-08-15 NOTE — Progress Notes (Signed)
This patient returns to my office for at risk foot care.  This patient requires this care by a professional since this patient will be at risk due to having  Diabetes.    This patient is unable to cut nails herself since the patient cannot reach her nails.These nails are painful walking and wearing shoes.  This patient presents for at risk foot care today.  Patient has multiple calluses both feet.  General Appearance  Alert, conversant and in no acute stress.  Vascular  Dorsalis pedis and posterior tibial  pulses are palpable  bilaterally.  Capillary return is within normal limits  bilaterally. Temperature is within normal limits  bilaterally.  Neurologic  Senn-Weinstein monofilament wire test within normal limits  bilaterally. Muscle power within normal limits bilaterally.  Nails Thick disfigured discolored nails with subungual debris  from hallux to fifth toes bilaterally. No evidence of bacterial infection or drainage bilaterally.  Orthopedic  No limitations of motion  feet .  No crepitus or effusions noted.  No bony pathology or digital deformities noted.  Skin  normotropic skin with no porokeratosis noted bilaterally.  No signs of infections or ulcers noted.  Porokeratosis sub 2  B/L, sub 4 left   and sub 4,5 right foot.   Onychomycosis  Pain in right toes  Pain in left toes  Porokeratosis  B/L.  Consent was obtained for treatment procedures.   Mechanical debridement of nails 1-5  bilaterally performed with a nail nipper.  Filed with dremel without incident. No infection or ulcer.  Debridement of callus  B/L with # 15 blade.   Return office visit    3 months                  Told patient to return for periodic foot care and evaluation due to potential at risk complications.   Venida Tsukamoto DPM  

## 2020-08-23 ENCOUNTER — Inpatient Hospital Stay: Payer: PPO

## 2020-08-23 ENCOUNTER — Other Ambulatory Visit: Payer: Self-pay

## 2020-08-23 ENCOUNTER — Inpatient Hospital Stay: Payer: PPO | Attending: Oncology | Admitting: Oncology

## 2020-08-23 ENCOUNTER — Encounter: Payer: Self-pay | Admitting: Oncology

## 2020-08-23 VITALS — BP 146/65 | HR 81 | Temp 96.7°F | Resp 16 | Wt 101.2 lb

## 2020-08-23 DIAGNOSIS — Z170 Estrogen receptor positive status [ER+]: Secondary | ICD-10-CM | POA: Insufficient documentation

## 2020-08-23 DIAGNOSIS — M810 Age-related osteoporosis without current pathological fracture: Secondary | ICD-10-CM | POA: Diagnosis not present

## 2020-08-23 DIAGNOSIS — Z08 Encounter for follow-up examination after completed treatment for malignant neoplasm: Secondary | ICD-10-CM | POA: Diagnosis not present

## 2020-08-23 DIAGNOSIS — Z90710 Acquired absence of both cervix and uterus: Secondary | ICD-10-CM | POA: Diagnosis not present

## 2020-08-23 DIAGNOSIS — E119 Type 2 diabetes mellitus without complications: Secondary | ICD-10-CM | POA: Diagnosis not present

## 2020-08-23 DIAGNOSIS — Z794 Long term (current) use of insulin: Secondary | ICD-10-CM | POA: Insufficient documentation

## 2020-08-23 DIAGNOSIS — Z79899 Other long term (current) drug therapy: Secondary | ICD-10-CM | POA: Insufficient documentation

## 2020-08-23 DIAGNOSIS — E039 Hypothyroidism, unspecified: Secondary | ICD-10-CM | POA: Insufficient documentation

## 2020-08-23 DIAGNOSIS — Z9011 Acquired absence of right breast and nipple: Secondary | ICD-10-CM | POA: Diagnosis not present

## 2020-08-23 DIAGNOSIS — E7800 Pure hypercholesterolemia, unspecified: Secondary | ICD-10-CM | POA: Insufficient documentation

## 2020-08-23 DIAGNOSIS — Z853 Personal history of malignant neoplasm of breast: Secondary | ICD-10-CM | POA: Diagnosis not present

## 2020-08-23 DIAGNOSIS — Z8249 Family history of ischemic heart disease and other diseases of the circulatory system: Secondary | ICD-10-CM | POA: Insufficient documentation

## 2020-08-23 DIAGNOSIS — Z79810 Long term (current) use of selective estrogen receptor modulators (SERMs): Secondary | ICD-10-CM | POA: Insufficient documentation

## 2020-08-23 DIAGNOSIS — Z7983 Long term (current) use of bisphosphonates: Secondary | ICD-10-CM | POA: Diagnosis not present

## 2020-08-23 DIAGNOSIS — C50911 Malignant neoplasm of unspecified site of right female breast: Secondary | ICD-10-CM | POA: Diagnosis not present

## 2020-08-23 DIAGNOSIS — I10 Essential (primary) hypertension: Secondary | ICD-10-CM | POA: Diagnosis not present

## 2020-08-23 LAB — COMPREHENSIVE METABOLIC PANEL
ALT: 11 U/L (ref 0–44)
AST: 26 U/L (ref 15–41)
Albumin: 3.6 g/dL (ref 3.5–5.0)
Alkaline Phosphatase: 28 U/L — ABNORMAL LOW (ref 38–126)
Anion gap: 9 (ref 5–15)
BUN: 13 mg/dL (ref 8–23)
CO2: 27 mmol/L (ref 22–32)
Calcium: 8.6 mg/dL — ABNORMAL LOW (ref 8.9–10.3)
Chloride: 99 mmol/L (ref 98–111)
Creatinine, Ser: 0.79 mg/dL (ref 0.44–1.00)
GFR calc non Af Amer: >60 mL/min (ref >60)
Glucose, Bld: 196 mg/dL — ABNORMAL HIGH (ref 70–99)
Potassium: 4.7 mmol/L (ref 3.5–5.1)
Sodium: 135 mmol/L (ref 135–145)
Total Bilirubin: 0.7 mg/dL (ref 0.3–1.2)
Total Protein: 6.4 g/dL — ABNORMAL LOW (ref 6.5–8.1)

## 2020-08-23 MED ORDER — DENOSUMAB 60 MG/ML ~~LOC~~ SOSY
60.0000 mg | PREFILLED_SYRINGE | Freq: Once | SUBCUTANEOUS | Status: AC
  Administered 2020-08-23: 60 mg via SUBCUTANEOUS
  Filled 2020-08-23: qty 1

## 2020-08-26 NOTE — Progress Notes (Signed)
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  Oncotype DX score was 31 but patient did not receive any chemotherapy or radiation.  She started taking tamoxifen in August 2011.  She does have baseline osteoporosis and has been on Prolia every 6 months.  Interval history-patient currently reports doing well and denies any recent health concerns.  No recent hospitalizations or infections.  Appetite and weight have remained stable.  ECOG PS- 1 Pain scale- 0   Review of systems- Review of Systems  Constitutional: Positive for malaise/fatigue. Negative for chills, fever and weight loss.  HENT: Negative for congestion, ear discharge and nosebleeds.   Eyes: Negative for blurred vision.  Respiratory: Negative for cough, hemoptysis, sputum production, shortness of breath and wheezing.   Cardiovascular: Negative for chest pain, palpitations, orthopnea and claudication.  Gastrointestinal: Negative for abdominal pain, blood in stool, constipation, diarrhea, heartburn, melena, nausea and vomiting.  Genitourinary: Negative for dysuria, flank pain, frequency, hematuria and urgency.  Musculoskeletal: Negative for back pain, joint pain and myalgias.  Skin: Negative for rash.  Neurological: Negative for dizziness, tingling, focal weakness, seizures, weakness  and headaches.  Endo/Heme/Allergies: Does not bruise/bleed easily.  Psychiatric/Behavioral: Negative for depression and suicidal ideas. The patient does not have insomnia.      Allergies  Allergen Reactions  . Lisinopril Rash and Other (See Comments)    Hyponatremia  Hyponatremia  Hyponatremia   . Sulfa Antibiotics Other (See Comments)    possible possible Patient doesn't know   . Sulfasalazine      Past Medical History:  Diagnosis Date  . Breast cancer ([place]) 2011   RT MASTECTOMY  . Cancer ([place]) 2011   BREAST CA  . diabetes insulin dep   . High cholesterol   . Hypertension   . Hypothyroidism   . Osteoporosis   . Thyroid disease      Past Surgical History:  Procedure Laterality Date  . BREAST BIOPSY Right 2011   positive  . CATARACT EXTRACTION W/PHACO Right 06/13/2016   Procedure: CATARACT EXTRACTION PHACO AND INTRAOCULAR LENS PLACEMENT (IOC);  Surgeon: Estill Cotta, MD;  Location: [facility] ORS;  Service: Ophthalmology;  Laterality: Right;  [place] 03:07 AP% 27.4 CDE 93.21 fluid pack lot # [id number]  . MASTECTOMY Right 2011   positive  . TONSILLECTOMY    . TOTAL ABDOMINAL HYSTERECTOMY      Social History   Socioeconomic History  . Marital status: Married    Spouse name: Not on file  . Number of children: Not on file  . Years of education: Not on file  . Highest education level: Not on file  Occupational History  . Not on file  Tobacco Use  . Smoking status: Never Smoker  . Smokeless tobacco: Never Used  Substance and Sexual Activity  . Alcohol use: No  . Drug use: No  . Sexual activity: Not on file  Other Topics Concern  .  Not on file  Social History Narrative  . Not on file   Social Determinants of Health   Financial Resource Strain:   . Difficulty of Paying Living Expenses: Not on file  Food Insecurity:   . Worried About Charity fundraiser in the Last Year: Not on file  . Ran Out of Food in the Last Year: Not on file  Transportation Needs:     . Lack of Transportation (Medical): Not on file  . Lack of Transportation (Non-Medical): Not on file  Physical Activity:   . Days of Exercise per Week: Not on file  . Minutes of Exercise per Session: Not on file  Stress:   . Feeling of Stress : Not on file  Social Connections:   . Frequency of Communication with Friends and Family: Not on file  . Frequency of Social Gatherings with Friends and Family: Not on file  . Attends Religious Services: Not on file  . Active Member of Clubs or Organizations: Not on file  . Attends Archivist Meetings: Not on file  . Marital Status: Not on file  Intimate Partner Violence:   . Fear of Current or Ex-Partner: Not on file  . Emotionally Abused: Not on file  . Physically Abused: Not on file  . Sexually Abused: Not on file    Family History  Problem Relation Age of Onset  . Heart disease Father        heart attack  . Breast cancer Neg Hx      Current Outpatient Medications:  .  Calcium Carbonate-Vitamin D (OYSTER SHELL CALCIUM 500 + D) 500-125 MG-UNIT TABS, Take by mouth., Disp: , Rfl:  .  denosumab (PROLIA) 60 MG/ML SOLN injection, Inject 60 mg into the skin every 6 (six) months., Disp: , Rfl:  .  enalapril (VASOTEC) 20 MG tablet, TAKE 1 TABLET TWICE A DAY, Disp: , Rfl:  .  glucose blood (ONETOUCH VERIO) test strip, Use 3 (three) times daily. ONE TOUCH VERIO TEST STRIPS. E10.9, Disp: , Rfl:  .  insulin aspart (NOVOLOG) 100 UNIT/ML injection, Inject 2-5 units up to three times daily as directed, Disp: , Rfl:  .  insulin glargine (LANTUS) 100 UNIT/ML injection, Inject 0.1 mLs (10 Units total) into the skin daily., Disp: 10 mL, Rfl: 11 .  Insulin Syringe-Needle U-100 (GLOBAL INJECT EASE INSULIN SYR) 30G X 1/2" 0.5 ML MISC, Use 4 (four) times daily, Disp: , Rfl:  .  levothyroxine (SYNTHROID) 25 MCG tablet, Take by mouth., Disp: , Rfl:  .  lovastatin (MEVACOR) 20 MG tablet, TAKE 1 TABLET DAILY, Disp: , Rfl:  .  tamoxifen (NOLVADEX) 20  MG tablet, TAKE 1 TABLET DAILY, Disp: 90 tablet, Rfl: 4  Physical exam:  Vitals:   08/23/20 1202  BP: (!) 146/65  Pulse: 81  Resp: 16  Temp: (!) 96.7 F (35.9 C)  TempSrc: Tympanic  SpO2: 99%  Weight: 101 lb 3.2 oz (45.9 kg)   Physical Exam Constitutional:      General: She is not in acute distress. Cardiovascular:     Rate and Rhythm: Normal rate and regular rhythm.     Heart sounds: Normal heart sounds.  Pulmonary:     Effort: Pulmonary effort is normal.     Breath sounds: Normal breath sounds.  Abdominal:     General: Bowel sounds are normal.     Palpations: Abdomen is soft.  Skin:    General: Skin is warm and dry.  Neurological:  Mental Status: She is alert and oriented to person, place, and time.     Breast exam is performed in seated and lying down position. Patient is status post right mastectomy without reconstruction. No evidence of any chest wall recurrence. No evidence of bilateral axillary adenopathy.  No palpable masses in the left breast.  CMP Latest Ref Rng & Units 08/23/2020  Glucose 70 - 99 mg/dL 196(H)  BUN 8 - 23 mg/dL 13  Creatinine 0.44 - 1.00 mg/dL 0.79  Sodium 135 - 145 mmol/L 135  Potassium 3.5 - 5.1 mmol/L 4.7  Chloride 98 - 111 mmol/L 99  CO2 22 - 32 mmol/L 27  Calcium 8.9 - 10.3 mg/dL 8.6(L)  Total Protein 6.5 - 8.1 g/dL 6.4(L)  Total Bilirubin 0.3 - 1.2 mg/dL 0.7  Alkaline Phos 38 - 126 U/L 28(L)  AST 15 - 41 U/L 26  ALT 0 - 44 U/L 11   CBC Latest Ref Rng & Units 08/24/2019  WBC 4.0 - 10.5 K/uL 6.8  Hemoglobin 12.0 - 15.0 g/dL 12.0  Hematocrit 36 - 46 % 36.6  Platelets 150 - 400 K/uL 190     Assessment and plan- Patient is a 79 y.o. female with stage II right breast cancer in 2011 s/p mastectomy and currently on tamoxifen. This is a routine follow-up visit  Patient has completed 10 years of tamoxifen and asked her to stop taking tamoxifen at this time.  Clinically patient is doing well with no concerning signs and symptoms of  recurrence based on today's exam.  She will be due for a left breast screening mammogram this month which has been scheduled by Dr. Ginette Pitman.    Patient has been receiving Prolia every 6 months for a while for her osteoporosis.  Typically drug holiday is not recommended in Prolia and therefore she will continue to receive that in 6 months and I will see her backIn 6 months for her next dose Visit Diagnosis 1. Long term (current) use of bisphosphonates   2. Encounter for follow-up surveillance of breast cancer   3. High risk medication use      Dr. Randa Evens, MD, MPH [facility] [phone] 08/26/2020 10:56 AM

## 2020-09-05 ENCOUNTER — Ambulatory Visit
Admission: RE | Admit: 2020-09-05 | Discharge: 2020-09-05 | Disposition: A | Discharge status: Home / Self Care | Payer: PPO | Source: Ambulatory Visit | Attending: Internal Medicine | Admitting: Internal Medicine

## 2020-09-05 ENCOUNTER — Other Ambulatory Visit: Payer: Self-pay

## 2020-09-05 DIAGNOSIS — Z1231 Encounter for screening mammogram for malignant neoplasm of breast: Secondary | ICD-10-CM

## 2020-09-12 ENCOUNTER — Other Ambulatory Visit: Payer: Self-pay | Admitting: Internal Medicine

## 2020-09-12 DIAGNOSIS — Z23 Encounter for immunization: Secondary | ICD-10-CM | POA: Diagnosis not present

## 2020-09-12 DIAGNOSIS — Z794 Long term (current) use of insulin: Secondary | ICD-10-CM | POA: Diagnosis not present

## 2020-09-12 DIAGNOSIS — E1065 Type 1 diabetes mellitus with hyperglycemia: Secondary | ICD-10-CM | POA: Diagnosis not present

## 2020-09-12 DIAGNOSIS — E10649 Type 1 diabetes mellitus with hypoglycemia without coma: Secondary | ICD-10-CM | POA: Diagnosis not present

## 2020-09-12 DIAGNOSIS — R928 Other abnormal and inconclusive findings on diagnostic imaging of breast: Secondary | ICD-10-CM

## 2020-09-12 DIAGNOSIS — N6489 Other specified disorders of breast: Secondary | ICD-10-CM

## 2020-09-14 ENCOUNTER — Ambulatory Visit
Admission: RE | Admit: 2020-09-14 | Discharge: 2020-09-14 | Disposition: A | Discharge status: Home / Self Care | Payer: PPO | Source: Ambulatory Visit | Attending: Internal Medicine | Admitting: Internal Medicine

## 2020-09-14 ENCOUNTER — Other Ambulatory Visit: Payer: Self-pay

## 2020-09-14 DIAGNOSIS — N6489 Other specified disorders of breast: Secondary | ICD-10-CM

## 2020-09-14 DIAGNOSIS — R928 Other abnormal and inconclusive findings on diagnostic imaging of breast: Secondary | ICD-10-CM | POA: Diagnosis not present

## 2020-09-16 DIAGNOSIS — E1065 Type 1 diabetes mellitus with hyperglycemia: Secondary | ICD-10-CM | POA: Diagnosis not present

## 2020-09-20 DIAGNOSIS — E119 Type 2 diabetes mellitus without complications: Secondary | ICD-10-CM | POA: Diagnosis not present

## 2020-10-17 DIAGNOSIS — E1065 Type 1 diabetes mellitus with hyperglycemia: Secondary | ICD-10-CM | POA: Diagnosis not present

## 2020-11-02 DIAGNOSIS — E1065 Type 1 diabetes mellitus with hyperglycemia: Secondary | ICD-10-CM | POA: Diagnosis not present

## 2020-11-14 ENCOUNTER — Ambulatory Visit: Payer: PPO | Admitting: Podiatry

## 2020-11-14 ENCOUNTER — Other Ambulatory Visit: Payer: Self-pay

## 2020-11-14 ENCOUNTER — Encounter: Payer: Self-pay | Admitting: Podiatry

## 2020-11-14 DIAGNOSIS — N179 Acute kidney failure, unspecified: Secondary | ICD-10-CM

## 2020-11-14 DIAGNOSIS — E109 Type 1 diabetes mellitus without complications: Secondary | ICD-10-CM

## 2020-11-14 DIAGNOSIS — M79675 Pain in left toe(s): Secondary | ICD-10-CM | POA: Diagnosis not present

## 2020-11-14 DIAGNOSIS — M79674 Pain in right toe(s): Secondary | ICD-10-CM | POA: Diagnosis not present

## 2020-11-14 DIAGNOSIS — Q828 Other specified congenital malformations of skin: Secondary | ICD-10-CM | POA: Diagnosis not present

## 2020-11-14 DIAGNOSIS — B351 Tinea unguium: Secondary | ICD-10-CM

## 2020-11-14 NOTE — Progress Notes (Signed)
This patient returns to my office for at risk foot care.  This patient requires this care by a professional since this patient will be at risk due to having  Diabetes.    This patient is unable to cut nails herself since the patient cannot reach her nails.These nails are painful walking and wearing shoes.  This patient presents for at risk foot care today.  Patient has multiple calluses both feet.  General Appearance  Alert, conversant and in no acute stress.  Vascular  Dorsalis pedis and posterior tibial  pulses are palpable  bilaterally.  Capillary return is within normal limits  bilaterally. Temperature is within normal limits  bilaterally.  Neurologic  Senn-Weinstein monofilament wire test within normal limits  bilaterally. Muscle power within normal limits bilaterally.  Nails Thick disfigured discolored nails with subungual debris  from hallux to fifth toes bilaterally. No evidence of bacterial infection or drainage bilaterally.  Orthopedic  No limitations of motion  feet .  No crepitus or effusions noted.  No bony pathology or digital deformities noted.  Skin  normotropic skin with no porokeratosis noted bilaterally.  No signs of infections or ulcers noted.  Porokeratosis sub 2  B/L, sub 4 left   and sub 4,5 right foot.   Onychomycosis  Pain in right toes  Pain in left toes  Porokeratosis  B/L.  Consent was obtained for treatment procedures.   Mechanical debridement of nails 1-5  bilaterally performed with a nail nipper.  Filed with dremel without incident. No infection or ulcer.  Debridement of callus  B/L with # 15 blade.   Return office visit    3 months                  Told patient to return for periodic foot care and evaluation due to potential at risk complications.   Lillyen Schow DPM  

## 2020-11-16 DIAGNOSIS — E1065 Type 1 diabetes mellitus with hyperglycemia: Secondary | ICD-10-CM | POA: Diagnosis not present

## 2020-12-07 DIAGNOSIS — E109 Type 1 diabetes mellitus without complications: Secondary | ICD-10-CM | POA: Diagnosis not present

## 2020-12-07 DIAGNOSIS — C50919 Malignant neoplasm of unspecified site of unspecified female breast: Secondary | ICD-10-CM | POA: Diagnosis not present

## 2020-12-07 DIAGNOSIS — E039 Hypothyroidism, unspecified: Secondary | ICD-10-CM | POA: Diagnosis not present

## 2020-12-07 DIAGNOSIS — R413 Other amnesia: Secondary | ICD-10-CM | POA: Diagnosis not present

## 2020-12-07 DIAGNOSIS — I10 Essential (primary) hypertension: Secondary | ICD-10-CM | POA: Diagnosis not present

## 2020-12-12 DIAGNOSIS — E039 Hypothyroidism, unspecified: Secondary | ICD-10-CM | POA: Diagnosis not present

## 2020-12-12 DIAGNOSIS — E109 Type 1 diabetes mellitus without complications: Secondary | ICD-10-CM | POA: Diagnosis not present

## 2020-12-12 DIAGNOSIS — C50919 Malignant neoplasm of unspecified site of unspecified female breast: Secondary | ICD-10-CM | POA: Diagnosis not present

## 2020-12-12 DIAGNOSIS — Z0000 Encounter for general adult medical examination without abnormal findings: Secondary | ICD-10-CM | POA: Diagnosis not present

## 2020-12-12 DIAGNOSIS — I10 Essential (primary) hypertension: Secondary | ICD-10-CM | POA: Diagnosis not present

## 2020-12-13 DIAGNOSIS — E109 Type 1 diabetes mellitus without complications: Secondary | ICD-10-CM | POA: Diagnosis not present

## 2020-12-13 DIAGNOSIS — I10 Essential (primary) hypertension: Secondary | ICD-10-CM | POA: Diagnosis not present

## 2020-12-13 DIAGNOSIS — R8290 Unspecified abnormal findings in urine: Secondary | ICD-10-CM | POA: Diagnosis not present

## 2020-12-13 DIAGNOSIS — C50919 Malignant neoplasm of unspecified site of unspecified female breast: Secondary | ICD-10-CM | POA: Diagnosis not present

## 2020-12-13 DIAGNOSIS — Z0000 Encounter for general adult medical examination without abnormal findings: Secondary | ICD-10-CM | POA: Diagnosis not present

## 2020-12-13 DIAGNOSIS — E039 Hypothyroidism, unspecified: Secondary | ICD-10-CM | POA: Diagnosis not present

## 2020-12-16 DIAGNOSIS — Z794 Long term (current) use of insulin: Secondary | ICD-10-CM | POA: Diagnosis not present

## 2020-12-16 DIAGNOSIS — E10649 Type 1 diabetes mellitus with hypoglycemia without coma: Secondary | ICD-10-CM | POA: Diagnosis not present

## 2020-12-16 DIAGNOSIS — E1065 Type 1 diabetes mellitus with hyperglycemia: Secondary | ICD-10-CM | POA: Diagnosis not present

## 2020-12-17 DIAGNOSIS — E1065 Type 1 diabetes mellitus with hyperglycemia: Secondary | ICD-10-CM | POA: Diagnosis not present

## 2021-01-16 DIAGNOSIS — E1065 Type 1 diabetes mellitus with hyperglycemia: Secondary | ICD-10-CM | POA: Diagnosis not present

## 2021-02-13 ENCOUNTER — Ambulatory Visit: Payer: PPO | Admitting: Podiatry

## 2021-02-13 ENCOUNTER — Encounter: Payer: Self-pay | Admitting: Podiatry

## 2021-02-13 ENCOUNTER — Other Ambulatory Visit: Payer: Self-pay

## 2021-02-13 DIAGNOSIS — B351 Tinea unguium: Secondary | ICD-10-CM

## 2021-02-13 DIAGNOSIS — N179 Acute kidney failure, unspecified: Secondary | ICD-10-CM | POA: Diagnosis not present

## 2021-02-13 DIAGNOSIS — E109 Type 1 diabetes mellitus without complications: Secondary | ICD-10-CM

## 2021-02-13 DIAGNOSIS — M79675 Pain in left toe(s): Secondary | ICD-10-CM | POA: Diagnosis not present

## 2021-02-13 DIAGNOSIS — E1065 Type 1 diabetes mellitus with hyperglycemia: Secondary | ICD-10-CM | POA: Diagnosis not present

## 2021-02-13 DIAGNOSIS — M79674 Pain in right toe(s): Secondary | ICD-10-CM

## 2021-02-13 DIAGNOSIS — Q828 Other specified congenital malformations of skin: Secondary | ICD-10-CM | POA: Diagnosis not present

## 2021-02-13 NOTE — Progress Notes (Signed)
This patient returns to my office for at risk foot care.  This patient requires this care by a professional since this patient will be at risk due to having  Diabetes.    This patient is unable to cut nails herself since the patient cannot reach her nails.These nails are painful walking and wearing shoes.  This patient presents for at risk foot care today.  Patient has multiple calluses both feet.  General Appearance  Alert, conversant and in no acute stress.  Vascular  Dorsalis pedis and posterior tibial  pulses are palpable  bilaterally.  Capillary return is within normal limits  bilaterally. Temperature is within normal limits  bilaterally.  Neurologic  Senn-Weinstein monofilament wire test within normal limits  bilaterally. Muscle power within normal limits bilaterally.  Nails Thick disfigured discolored nails with subungual debris  from hallux to fifth toes bilaterally. No evidence of bacterial infection or drainage bilaterally.  Orthopedic  No limitations of motion  feet .  No crepitus or effusions noted.  No bony pathology or digital deformities noted.  Skin  normotropic skin with no porokeratosis noted bilaterally.  No signs of infections or ulcers noted.  Porokeratosis sub 2  B/L, sub 4 left   and sub 4,5 right foot.   Onychomycosis  Pain in right toes  Pain in left toes  Porokeratosis  B/L.  Consent was obtained for treatment procedures.   Mechanical debridement of nails 1-5  bilaterally performed with a nail nipper.  Filed with dremel without incident. No infection or ulcer.  Debridement of callus  B/L with # 15 blade.   Return office visit    3 months                  Told patient to return for periodic foot care and evaluation due to potential at risk complications.   Gardiner Barefoot DPM

## 2021-02-21 ENCOUNTER — Inpatient Hospital Stay: Payer: PPO | Attending: Oncology

## 2021-02-21 ENCOUNTER — Inpatient Hospital Stay (HOSPITAL_BASED_OUTPATIENT_CLINIC_OR_DEPARTMENT_OTHER): Payer: PPO | Admitting: Oncology

## 2021-02-21 ENCOUNTER — Inpatient Hospital Stay: Payer: PPO

## 2021-02-21 VITALS — BP 150/71 | HR 97 | Temp 97.6°F | Wt 100.7 lb

## 2021-02-21 DIAGNOSIS — Z853 Personal history of malignant neoplasm of breast: Secondary | ICD-10-CM | POA: Diagnosis not present

## 2021-02-21 DIAGNOSIS — I10 Essential (primary) hypertension: Secondary | ICD-10-CM | POA: Diagnosis not present

## 2021-02-21 DIAGNOSIS — Z794 Long term (current) use of insulin: Secondary | ICD-10-CM | POA: Diagnosis not present

## 2021-02-21 DIAGNOSIS — Z08 Encounter for follow-up examination after completed treatment for malignant neoplasm: Secondary | ICD-10-CM

## 2021-02-21 DIAGNOSIS — Z5181 Encounter for therapeutic drug level monitoring: Secondary | ICD-10-CM | POA: Diagnosis not present

## 2021-02-21 DIAGNOSIS — M810 Age-related osteoporosis without current pathological fracture: Secondary | ICD-10-CM | POA: Insufficient documentation

## 2021-02-21 DIAGNOSIS — E119 Type 2 diabetes mellitus without complications: Secondary | ICD-10-CM | POA: Diagnosis not present

## 2021-02-21 DIAGNOSIS — E039 Hypothyroidism, unspecified: Secondary | ICD-10-CM | POA: Insufficient documentation

## 2021-02-21 DIAGNOSIS — Z79899 Other long term (current) drug therapy: Secondary | ICD-10-CM | POA: Insufficient documentation

## 2021-02-21 DIAGNOSIS — Z7983 Long term (current) use of bisphosphonates: Secondary | ICD-10-CM

## 2021-02-21 LAB — CBC WITH DIFFERENTIAL/PLATELET
Abs Immature Granulocytes: 0.03 10*3/uL (ref 0.00–0.07)
Basophils Absolute: 0.1 10*3/uL (ref 0.0–0.1)
Basophils Relative: 1 %
Eosinophils Absolute: 0.2 10*3/uL (ref 0.0–0.5)
Eosinophils Relative: 2 %
HCT: 40.3 % (ref 36.0–46.0)
Hemoglobin: 13 g/dL (ref 12.0–15.0)
Immature Granulocytes: 0 %
Lymphocytes Relative: 40 %
Lymphs Abs: 2.8 10*3/uL (ref 0.7–4.0)
MCH: 32.3 pg (ref 26.0–34.0)
MCHC: 32.3 g/dL (ref 30.0–36.0)
MCV: 100.2 fL — ABNORMAL HIGH (ref 80.0–100.0)
Monocytes Absolute: 0.7 10*3/uL (ref 0.1–1.0)
Monocytes Relative: 10 %
Neutro Abs: 3.4 10*3/uL (ref 1.7–7.7)
Neutrophils Relative %: 47 %
Platelets: 218 10*3/uL (ref 150–400)
RBC: 4.02 MIL/uL (ref 3.87–5.11)
RDW: 13.6 % (ref 11.5–15.5)
WBC: 7.2 10*3/uL (ref 4.0–10.5)
nRBC: 0 % (ref 0.0–0.2)

## 2021-02-21 LAB — COMPREHENSIVE METABOLIC PANEL
ALT: 16 U/L (ref 0–44)
AST: 32 U/L (ref 15–41)
Albumin: 3.8 g/dL (ref 3.5–5.0)
Alkaline Phosphatase: 34 U/L — ABNORMAL LOW (ref 38–126)
Anion gap: 10 (ref 5–15)
BUN: 16 mg/dL (ref 8–23)
CO2: 29 mmol/L (ref 22–32)
Calcium: 9.1 mg/dL (ref 8.9–10.3)
Chloride: 101 mmol/L (ref 98–111)
Creatinine, Ser: 0.78 mg/dL (ref 0.44–1.00)
GFR, Estimated: >60 mL/min (ref >60)
Glucose, Bld: 121 mg/dL — ABNORMAL HIGH (ref 70–99)
Potassium: 3.5 mmol/L (ref 3.5–5.1)
Sodium: 140 mmol/L (ref 135–145)
Total Bilirubin: 0.7 mg/dL (ref 0.3–1.2)
Total Protein: 6.2 g/dL — ABNORMAL LOW (ref 6.5–8.1)

## 2021-02-21 MED ORDER — DENOSUMAB 60 MG/ML ~~LOC~~ SOSY
60.0000 mg | PREFILLED_SYRINGE | SUBCUTANEOUS | Status: AC
  Administered 2021-02-21: 60 mg via SUBCUTANEOUS
  Filled 2021-02-21: qty 1

## 2021-02-22 NOTE — Progress Notes (Signed)
Hematology/Oncology Consult note 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 Oncotype DX score was 31 but patient did not receive any chemotherapy or radiation. She started taking tamoxifen in August 2011. She does have baseline osteoporosis and has been on Prolia every 6 months.   Interval history-patient is doing well for her age.  Denies any recent infections or hospitalizations.  Appetite and weight have remained stable.  ECOG PS- 2 Pain scale- 0   Review of systems- Review of Systems  Constitutional: Negative for chills, fever, malaise/fatigue and weight loss.  HENT: Negative for congestion, ear discharge and nosebleeds.   Eyes: Negative for blurred vision.  Respiratory: Negative for cough, hemoptysis, sputum production, shortness of breath and wheezing.   Cardiovascular: Negative for chest pain, palpitations, orthopnea and claudication.  Gastrointestinal: Negative for abdominal pain, blood in stool, constipation, diarrhea, heartburn, melena, nausea and vomiting.  Genitourinary: Negative for dysuria, flank pain, frequency, hematuria and urgency.  Musculoskeletal: Negative for back pain, joint pain and myalgias.  Skin: Negative for rash.  Neurological: Negative for dizziness, tingling, focal weakness, seizures,  weakness and headaches.  Endo/Heme/Allergies: Does not bruise/bleed easily.  Psychiatric/Behavioral: Negative for depression and suicidal ideas. The patient does not have insomnia.      Allergies  Allergen Reactions  . Lisinopril Rash and Other (See Comments)    Hyponatremia  Hyponatremia  Hyponatremia   . Sulfa Antibiotics Other (See Comments)    possible possible Patient doesn't know   . Sulfasalazine      Past Medical History:  Diagnosis Date  . Breast cancer ([place]) 2011   RT MASTECTOMY  . Cancer ([place]) 2011   BREAST CA  . diabetes insulin dep   . High cholesterol   . Hypertension   . Hypothyroidism   . Osteoporosis   . Thyroid disease      Past Surgical History:  Procedure Laterality Date  . BREAST BIOPSY Right 2011   positive  . CATARACT EXTRACTION W/PHACO Right 06/13/2016   Procedure: CATARACT EXTRACTION PHACO AND INTRAOCULAR LENS PLACEMENT (IOC);  Surgeon: Estill Cotta, MD;  Location: [facility] ORS;  Service: Ophthalmology;  Laterality: Right;  [place] 03:07 AP% 27.4 CDE 93.21 fluid pack lot # [id number]  . MASTECTOMY Right 2011   positive  . TONSILLECTOMY    . TOTAL ABDOMINAL HYSTERECTOMY      Social History   Socioeconomic History  . Marital status: Married    Spouse name: Not on file  . Number of children: Not on file  . Years of education: Not on file  . Highest education level: Not on file  Occupational History  . Not on file  Tobacco Use  . Smoking status: Never Smoker  . Smokeless tobacco: Never Used  Substance and Sexual Activity  . Alcohol use: No  . Drug use: No  . Sexual activity: Not on file  Other Topics Concern  .  Not on file  Social History Narrative  . Not on file   Social Determinants of Health   Financial Resource Strain: Not on file  Food Insecurity: Not on file  Transportation Needs: Not on file  Physical Activity: Not on file  Stress: Not on file  Social Connections: Not on file  Intimate Partner Violence: Not on file     Family History  Problem Relation Age of Onset  . Heart disease Father        heart attack  . Breast cancer Neg Hx      Current Outpatient Medications:  .  Calcium Carbonate-Vitamin D 500-125 MG-UNIT TABS, Take by mouth., Disp: , Rfl:  .  denosumab (PROLIA) 60 MG/ML SOLN injection, Inject 60 mg into the skin every 6 (six) months., Disp: , Rfl:  .  enalapril (VASOTEC) 20 MG tablet, TAKE 1 TABLET TWICE A DAY, Disp: , Rfl:  .  glucose blood (ONETOUCH VERIO) test strip, Use 3 (three) times daily. ONE TOUCH VERIO TEST STRIPS. E10.9, Disp: , Rfl:  .  insulin aspart (NOVOLOG) 100 UNIT/ML injection, Inject 2-5 units up to three times daily as directed, Disp: , Rfl:  .  insulin glargine (LANTUS) 100 UNIT/ML injection, Inject 0.1 mLs (10 Units total) into the skin daily., Disp: 10 mL, Rfl: 11 .  levothyroxine (SYNTHROID) 25 MCG tablet, Take by mouth., Disp: , Rfl:  .  lovastatin (MEVACOR) 20 MG tablet, TAKE 1 TABLET DAILY, Disp: , Rfl:  .  tamoxifen (NOLVADEX) 20 MG tablet, TAKE 1 TABLET DAILY, Disp: 90 tablet, Rfl: 4 No current facility-administered medications for this visit.  Facility-Administered Medications Ordered in Other Visits:  .  denosumab (PROLIA) injection 60 mg, 60 mg, Subcutaneous, Q6 months, Sindy Guadeloupe, MD, 60 mg at 02/21/21 1111  Physical exam:  Vitals:   02/21/21 1013  BP: (!) 150/71  Pulse: 97  Temp: 97.6 F (36.4 C)  TempSrc: Tympanic  SpO2: 99%  Weight: 100 lb 11.2 oz (45.7 kg)   Physical Exam Constitutional:      General: She is not in acute distress. Cardiovascular:     Rate and Rhythm: Normal rate and regular rhythm.     Heart sounds: Normal heart sounds.  Pulmonary:     Effort: Pulmonary effort is normal.     Breath sounds: Normal breath sounds.  Skin:    General: Skin is warm and dry.  Neurological:     Mental Status: She is alert and oriented to person, place, and time.   Breast exam: Patient is s/p right mastectomy without reconstruction.  No  evidence of chest wall recurrence.  No palpable masses in the left breast.  No palpable bilateral axillary adenopathy.  CMP Latest Ref Rng & Units 02/21/2021  Glucose 70 - 99 mg/dL 121(H)  BUN 8 - 23 mg/dL 16  Creatinine 0.44 - 1.00 mg/dL 0.78  Sodium 135 - 145 mmol/L 140  Potassium 3.5 - 5.1 mmol/L 3.5  Chloride 98 - 111 mmol/L 101  CO2 22 - 32 mmol/L 29  Calcium 8.9 - 10.3 mg/dL 9.1  Total Protein 6.5 - 8.1 g/dL 6.2(L)  Total Bilirubin 0.3 - 1.2 mg/dL 0.7  Alkaline Phos 38 - 126 U/L 34(L)  AST 15 - 41 U/L 32  ALT 0 - 44 U/L 16   CBC Latest Ref Rng & Units 02/21/2021  WBC 4.0 - 10.5 K/uL 7.2  Hemoglobin 12.0 - 15.0 g/dL 13.0  Hematocrit 36.0 - 46.0 % 40.3  Platelets 150 - 400  K/uL 218     Assessment and plan- Patient is a 80 y.o. female with stage II right breast cancer in 2011 s/p mastectomy and 10 years of tamoxifen therapy here for routine follow-up  Clinically patient is doing well with no concerning signs and symptoms of recurrence based on today's exam.  She has baseline osteoporosis for which she receives Prolia every 6 months.  Calcium levels and renal functions acceptable to proceed with Prolia today and I will see her back in 6 months.  We will also schedule her for a bone density scan in October 2022   Visit Diagnosis 1. Osteoporosis without current pathological fracture, unspecified osteoporosis type   2. Long term (current) use of bisphosphonates   3. Hx of breast cancer   4. Encounter for monitoring denosumab therapy      Dr. Randa Evens, MD, MPH [facility] [phone] 02/22/2021 10:55 AM

## 2021-03-08 ENCOUNTER — Inpatient Hospital Stay: Payer: PPO

## 2021-03-08 ENCOUNTER — Other Ambulatory Visit: Payer: Self-pay

## 2021-03-08 ENCOUNTER — Inpatient Hospital Stay
Admission: EM | Admit: 2021-03-08 | Discharge: 2021-03-14 | DRG: 637 | Disposition: A | Discharge status: Skilled Nursing Facility | Payer: PPO | Attending: Internal Medicine | Admitting: Internal Medicine

## 2021-03-08 ENCOUNTER — Emergency Department: Payer: PPO

## 2021-03-08 DIAGNOSIS — E785 Hyperlipidemia, unspecified: Secondary | ICD-10-CM | POA: Diagnosis not present

## 2021-03-08 DIAGNOSIS — I493 Ventricular premature depolarization: Secondary | ICD-10-CM | POA: Diagnosis present

## 2021-03-08 DIAGNOSIS — Z90710 Acquired absence of both cervix and uterus: Secondary | ICD-10-CM

## 2021-03-08 DIAGNOSIS — R809 Proteinuria, unspecified: Secondary | ICD-10-CM | POA: Diagnosis present

## 2021-03-08 DIAGNOSIS — R4182 Altered mental status, unspecified: Secondary | ICD-10-CM | POA: Diagnosis not present

## 2021-03-08 DIAGNOSIS — I2510 Atherosclerotic heart disease of native coronary artery without angina pectoris: Secondary | ICD-10-CM | POA: Diagnosis not present

## 2021-03-08 DIAGNOSIS — E860 Dehydration: Secondary | ICD-10-CM | POA: Diagnosis not present

## 2021-03-08 DIAGNOSIS — E1311 Other specified diabetes mellitus with ketoacidosis with coma: Secondary | ICD-10-CM

## 2021-03-08 DIAGNOSIS — I4729 Other ventricular tachycardia: Secondary | ICD-10-CM

## 2021-03-08 DIAGNOSIS — A419 Sepsis, unspecified organism: Secondary | ICD-10-CM | POA: Diagnosis not present

## 2021-03-08 DIAGNOSIS — R000 Tachycardia, unspecified: Secondary | ICD-10-CM | POA: Diagnosis not present

## 2021-03-08 DIAGNOSIS — E538 Deficiency of other specified B group vitamins: Secondary | ICD-10-CM | POA: Diagnosis present

## 2021-03-08 DIAGNOSIS — G9341 Metabolic encephalopathy: Secondary | ICD-10-CM | POA: Diagnosis not present

## 2021-03-08 DIAGNOSIS — Z20822 Contact with and (suspected) exposure to covid-19: Secondary | ICD-10-CM | POA: Diagnosis not present

## 2021-03-08 DIAGNOSIS — R6510 Systemic inflammatory response syndrome (SIRS) of non-infectious origin without acute organ dysfunction: Secondary | ICD-10-CM | POA: Diagnosis not present

## 2021-03-08 DIAGNOSIS — E872 Acidosis, unspecified: Secondary | ICD-10-CM | POA: Insufficient documentation

## 2021-03-08 DIAGNOSIS — E875 Hyperkalemia: Secondary | ICD-10-CM | POA: Diagnosis not present

## 2021-03-08 DIAGNOSIS — Z9011 Acquired absence of right breast and nipple: Secondary | ICD-10-CM

## 2021-03-08 DIAGNOSIS — Z66 Do not resuscitate: Secondary | ICD-10-CM | POA: Diagnosis present

## 2021-03-08 DIAGNOSIS — I10 Essential (primary) hypertension: Secondary | ICD-10-CM | POA: Diagnosis present

## 2021-03-08 DIAGNOSIS — C50919 Malignant neoplasm of unspecified site of unspecified female breast: Secondary | ICD-10-CM | POA: Diagnosis present

## 2021-03-08 DIAGNOSIS — D696 Thrombocytopenia, unspecified: Secondary | ICD-10-CM | POA: Diagnosis present

## 2021-03-08 DIAGNOSIS — E559 Vitamin D deficiency, unspecified: Secondary | ICD-10-CM | POA: Diagnosis not present

## 2021-03-08 DIAGNOSIS — R739 Hyperglycemia, unspecified: Secondary | ICD-10-CM

## 2021-03-08 DIAGNOSIS — I472 Ventricular tachycardia: Secondary | ICD-10-CM | POA: Diagnosis not present

## 2021-03-08 DIAGNOSIS — Z961 Presence of intraocular lens: Secondary | ICD-10-CM | POA: Diagnosis present

## 2021-03-08 DIAGNOSIS — R0689 Other abnormalities of breathing: Secondary | ICD-10-CM | POA: Diagnosis not present

## 2021-03-08 DIAGNOSIS — S2231XA Fracture of one rib, right side, initial encounter for closed fracture: Secondary | ICD-10-CM | POA: Diagnosis not present

## 2021-03-08 DIAGNOSIS — R4781 Slurred speech: Secondary | ICD-10-CM | POA: Diagnosis present

## 2021-03-08 DIAGNOSIS — I25118 Atherosclerotic heart disease of native coronary artery with other forms of angina pectoris: Secondary | ICD-10-CM | POA: Diagnosis not present

## 2021-03-08 DIAGNOSIS — E039 Hypothyroidism, unspecified: Secondary | ICD-10-CM | POA: Diagnosis present

## 2021-03-08 DIAGNOSIS — E871 Hypo-osmolality and hyponatremia: Secondary | ICD-10-CM | POA: Diagnosis not present

## 2021-03-08 DIAGNOSIS — D539 Nutritional anemia, unspecified: Secondary | ICD-10-CM | POA: Diagnosis not present

## 2021-03-08 DIAGNOSIS — Z794 Long term (current) use of insulin: Secondary | ICD-10-CM

## 2021-03-08 DIAGNOSIS — R112 Nausea with vomiting, unspecified: Secondary | ICD-10-CM | POA: Diagnosis present

## 2021-03-08 DIAGNOSIS — E1110 Type 2 diabetes mellitus with ketoacidosis without coma: Secondary | ICD-10-CM | POA: Diagnosis present

## 2021-03-08 DIAGNOSIS — R0902 Hypoxemia: Secondary | ICD-10-CM | POA: Diagnosis not present

## 2021-03-08 DIAGNOSIS — E43 Unspecified severe protein-calorie malnutrition: Secondary | ICD-10-CM | POA: Diagnosis not present

## 2021-03-08 DIAGNOSIS — M2550 Pain in unspecified joint: Secondary | ICD-10-CM | POA: Diagnosis not present

## 2021-03-08 DIAGNOSIS — Z79810 Long term (current) use of selective estrogen receptor modulators (SERMs): Secondary | ICD-10-CM

## 2021-03-08 DIAGNOSIS — E7800 Pure hypercholesterolemia, unspecified: Secondary | ICD-10-CM | POA: Diagnosis present

## 2021-03-08 DIAGNOSIS — I214 Non-ST elevation (NSTEMI) myocardial infarction: Secondary | ICD-10-CM | POA: Diagnosis not present

## 2021-03-08 DIAGNOSIS — Z7189 Other specified counseling: Secondary | ICD-10-CM | POA: Diagnosis not present

## 2021-03-08 DIAGNOSIS — E1069 Type 1 diabetes mellitus with other specified complication: Secondary | ICD-10-CM | POA: Diagnosis not present

## 2021-03-08 DIAGNOSIS — Z9841 Cataract extraction status, right eye: Secondary | ICD-10-CM

## 2021-03-08 DIAGNOSIS — N179 Acute kidney failure, unspecified: Secondary | ICD-10-CM | POA: Diagnosis present

## 2021-03-08 DIAGNOSIS — Z853 Personal history of malignant neoplasm of breast: Secondary | ICD-10-CM

## 2021-03-08 DIAGNOSIS — E876 Hypokalemia: Secondary | ICD-10-CM | POA: Diagnosis not present

## 2021-03-08 DIAGNOSIS — Z888 Allergy status to other drugs, medicaments and biological substances status: Secondary | ICD-10-CM

## 2021-03-08 DIAGNOSIS — Z8249 Family history of ischemic heart disease and other diseases of the circulatory system: Secondary | ICD-10-CM

## 2021-03-08 DIAGNOSIS — E1165 Type 2 diabetes mellitus with hyperglycemia: Secondary | ICD-10-CM | POA: Diagnosis not present

## 2021-03-08 DIAGNOSIS — Z7401 Bed confinement status: Secondary | ICD-10-CM | POA: Diagnosis not present

## 2021-03-08 DIAGNOSIS — Z882 Allergy status to sulfonamides status: Secondary | ICD-10-CM

## 2021-03-08 DIAGNOSIS — E1010 Type 1 diabetes mellitus with ketoacidosis without coma: Secondary | ICD-10-CM | POA: Diagnosis not present

## 2021-03-08 DIAGNOSIS — Z79899 Other long term (current) drug therapy: Secondary | ICD-10-CM

## 2021-03-08 DIAGNOSIS — R410 Disorientation, unspecified: Secondary | ICD-10-CM | POA: Diagnosis not present

## 2021-03-08 DIAGNOSIS — Z79890 Hormone replacement therapy (postmenopausal): Secondary | ICD-10-CM

## 2021-03-08 DIAGNOSIS — R404 Transient alteration of awareness: Secondary | ICD-10-CM | POA: Diagnosis not present

## 2021-03-08 DIAGNOSIS — M109 Gout, unspecified: Secondary | ICD-10-CM | POA: Diagnosis not present

## 2021-03-08 DIAGNOSIS — E109 Type 1 diabetes mellitus without complications: Secondary | ICD-10-CM | POA: Diagnosis not present

## 2021-03-08 DIAGNOSIS — M810 Age-related osteoporosis without current pathological fracture: Secondary | ICD-10-CM | POA: Diagnosis not present

## 2021-03-08 DIAGNOSIS — E861 Hypovolemia: Secondary | ICD-10-CM | POA: Diagnosis not present

## 2021-03-08 DIAGNOSIS — Z7982 Long term (current) use of aspirin: Secondary | ICD-10-CM | POA: Diagnosis not present

## 2021-03-08 LAB — URINALYSIS, COMPLETE (UACMP) WITH MICROSCOPIC
Bilirubin Urine: NEGATIVE
Glucose, UA: >=500 mg/dL — AB
Ketones, ur: 80 mg/dL — AB
Leukocytes,Ua: NEGATIVE
Nitrite: NEGATIVE
Protein, ur: 30 mg/dL — AB
Specific Gravity, Urine: 1.018 (ref 1.005–1.030)
pH: 5 (ref 5.0–8.0)

## 2021-03-08 LAB — COMPREHENSIVE METABOLIC PANEL
ALT: 17 U/L (ref 0–44)
AST: 36 U/L (ref 15–41)
Albumin: 4 g/dL (ref 3.5–5.0)
Alkaline Phosphatase: 40 U/L (ref 38–126)
BUN: 40 mg/dL — ABNORMAL HIGH (ref 8–23)
CO2: <7 mmol/L — ABNORMAL LOW (ref 22–32)
Calcium: 9 mg/dL (ref 8.9–10.3)
Chloride: 91 mmol/L — ABNORMAL LOW (ref 98–111)
Creatinine, Ser: 2.45 mg/dL — ABNORMAL HIGH (ref 0.44–1.00)
GFR, Estimated: 19 mL/min — ABNORMAL LOW (ref >60)
Glucose, Bld: 988 mg/dL (ref 70–99)
Potassium: 6.6 mmol/L (ref 3.5–5.1)
Sodium: 132 mmol/L — ABNORMAL LOW (ref 135–145)
Total Bilirubin: 2.1 mg/dL — ABNORMAL HIGH (ref 0.3–1.2)
Total Protein: 6.3 g/dL — ABNORMAL LOW (ref 6.5–8.1)

## 2021-03-08 LAB — CBC WITH DIFFERENTIAL/PLATELET
Abs Immature Granulocytes: 0.48 10*3/uL — ABNORMAL HIGH (ref 0.00–0.07)
Basophils Absolute: 0.1 10*3/uL (ref 0.0–0.1)
Basophils Relative: 1 %
Eosinophils Absolute: 0 10*3/uL (ref 0.0–0.5)
Eosinophils Relative: 0 %
HCT: 35.1 % — ABNORMAL LOW (ref 36.0–46.0)
Hemoglobin: 10.8 g/dL — ABNORMAL LOW (ref 12.0–15.0)
Immature Granulocytes: 2 %
Lymphocytes Relative: 11 %
Lymphs Abs: 2.4 10*3/uL (ref 0.7–4.0)
MCH: 33.4 pg (ref 26.0–34.0)
MCHC: 30.8 g/dL (ref 30.0–36.0)
MCV: 108.7 fL — ABNORMAL HIGH (ref 80.0–100.0)
Monocytes Absolute: 2.2 10*3/uL — ABNORMAL HIGH (ref 0.1–1.0)
Monocytes Relative: 10 %
Neutro Abs: 17.1 10*3/uL — ABNORMAL HIGH (ref 1.7–7.7)
Neutrophils Relative %: 76 %
Platelets: 241 10*3/uL (ref 150–400)
RBC: 3.23 MIL/uL — ABNORMAL LOW (ref 3.87–5.11)
RDW: 14.4 % (ref 11.5–15.5)
WBC: 22.3 10*3/uL — ABNORMAL HIGH (ref 4.0–10.5)
nRBC: 0 % (ref 0.0–0.2)

## 2021-03-08 LAB — BASIC METABOLIC PANEL
Anion gap: 22 — ABNORMAL HIGH (ref 5–15)
Anion gap: 12 (ref 5–15)
Anion gap: 10 (ref 5–15)
BUN: 37 mg/dL — ABNORMAL HIGH (ref 8–23)
BUN: 36 mg/dL — ABNORMAL HIGH (ref 8–23)
BUN: 32 mg/dL — ABNORMAL HIGH (ref 8–23)
BUN: 33 mg/dL — ABNORMAL HIGH (ref 8–23)
CO2: <7 mmol/L — ABNORMAL LOW (ref 22–32)
CO2: 11 mmol/L — ABNORMAL LOW (ref 22–32)
CO2: 18 mmol/L — ABNORMAL LOW (ref 22–32)
CO2: 20 mmol/L — ABNORMAL LOW (ref 22–32)
Calcium: 7.7 mg/dL — ABNORMAL LOW (ref 8.9–10.3)
Calcium: 8 mg/dL — ABNORMAL LOW (ref 8.9–10.3)
Calcium: 8.1 mg/dL — ABNORMAL LOW (ref 8.9–10.3)
Calcium: 8.3 mg/dL — ABNORMAL LOW (ref 8.9–10.3)
Chloride: 100 mmol/L (ref 98–111)
Chloride: 102 mmol/L (ref 98–111)
Chloride: 105 mmol/L (ref 98–111)
Chloride: 106 mmol/L (ref 98–111)
Creatinine, Ser: 2.3 mg/dL — ABNORMAL HIGH (ref 0.44–1.00)
Creatinine, Ser: 2.1 mg/dL — ABNORMAL HIGH (ref 0.44–1.00)
Creatinine, Ser: 1.65 mg/dL — ABNORMAL HIGH (ref 0.44–1.00)
Creatinine, Ser: 1.56 mg/dL — ABNORMAL HIGH (ref 0.44–1.00)
GFR, Estimated: 21 mL/min — ABNORMAL LOW (ref >60)
GFR, Estimated: 23 mL/min — ABNORMAL LOW (ref >60)
GFR, Estimated: 31 mL/min — ABNORMAL LOW (ref >60)
GFR, Estimated: 33 mL/min — ABNORMAL LOW (ref >60)
Glucose, Bld: 783 mg/dL (ref 70–99)
Glucose, Bld: 561 mg/dL (ref 70–99)
Glucose, Bld: 343 mg/dL — ABNORMAL HIGH (ref 70–99)
Glucose, Bld: 198 mg/dL — ABNORMAL HIGH (ref 70–99)
Potassium: 4.9 mmol/L (ref 3.5–5.1)
Potassium: 4.2 mmol/L (ref 3.5–5.1)
Potassium: 3.7 mmol/L (ref 3.5–5.1)
Potassium: 3.7 mmol/L (ref 3.5–5.1)
Sodium: 134 mmol/L — ABNORMAL LOW (ref 135–145)
Sodium: 135 mmol/L (ref 135–145)
Sodium: 135 mmol/L (ref 135–145)
Sodium: 136 mmol/L (ref 135–145)

## 2021-03-08 LAB — TROPONIN I (HIGH SENSITIVITY)
Troponin I (High Sensitivity): 26528 ng/L (ref <18)
Troponin I (High Sensitivity): >27000 ng/L (ref <18)
Troponin I (High Sensitivity): >27000 ng/L (ref <18)

## 2021-03-08 LAB — GLUCOSE, CAPILLARY
Glucose-Capillary: >600 mg/dL (ref 70–99)
Glucose-Capillary: >600 mg/dL (ref 70–99)
Glucose-Capillary: >600 mg/dL (ref 70–99)
Glucose-Capillary: >600 mg/dL (ref 70–99)
Glucose-Capillary: 574 mg/dL (ref 70–99)
Glucose-Capillary: >600 mg/dL (ref 70–99)
Glucose-Capillary: 573 mg/dL (ref 70–99)
Glucose-Capillary: 469 mg/dL — ABNORMAL HIGH (ref 70–99)
Glucose-Capillary: 412 mg/dL — ABNORMAL HIGH (ref 70–99)
Glucose-Capillary: 498 mg/dL — ABNORMAL HIGH (ref 70–99)
Glucose-Capillary: 361 mg/dL — ABNORMAL HIGH (ref 70–99)
Glucose-Capillary: 332 mg/dL — ABNORMAL HIGH (ref 70–99)
Glucose-Capillary: 296 mg/dL — ABNORMAL HIGH (ref 70–99)
Glucose-Capillary: 265 mg/dL — ABNORMAL HIGH (ref 70–99)
Glucose-Capillary: 189 mg/dL — ABNORMAL HIGH (ref 70–99)
Glucose-Capillary: 187 mg/dL — ABNORMAL HIGH (ref 70–99)

## 2021-03-08 LAB — BETA-HYDROXYBUTYRIC ACID
Beta-Hydroxybutyric Acid: >8 mmol/L — ABNORMAL HIGH (ref 0.05–0.27)
Beta-Hydroxybutyric Acid: 6.27 mmol/L — ABNORMAL HIGH (ref 0.05–0.27)

## 2021-03-08 LAB — CBG MONITORING, ED: Glucose-Capillary: >600 mg/dL (ref 70–99)

## 2021-03-08 LAB — MAGNESIUM: Magnesium: 2 mg/dL (ref 1.7–2.4)

## 2021-03-08 LAB — LACTIC ACID, PLASMA
Lactic Acid, Venous: 8 mmol/L (ref 0.5–1.9)
Lactic Acid, Venous: 8.7 mmol/L (ref 0.5–1.9)
Lactic Acid, Venous: 7.1 mmol/L (ref 0.5–1.9)
Lactic Acid, Venous: 6.4 mmol/L (ref 0.5–1.9)
Lactic Acid, Venous: 3.2 mmol/L (ref 0.5–1.9)
Lactic Acid, Venous: 2.4 mmol/L (ref 0.5–1.9)

## 2021-03-08 LAB — PROCALCITONIN: Procalcitonin: 5.86 ng/mL

## 2021-03-08 LAB — HEMOGLOBIN A1C
Hgb A1c MFr Bld: 8.9 % — ABNORMAL HIGH (ref 4.8–5.6)
Mean Plasma Glucose: 208.73 mg/dL

## 2021-03-08 LAB — BLOOD GAS, VENOUS
Acid-base deficit: 23.1 mmol/L — ABNORMAL HIGH (ref 0.0–2.0)
Bicarbonate: 5 mmol/L — ABNORMAL LOW (ref 20.0–28.0)
O2 Saturation: 81.5 %
Patient temperature: 37
pCO2, Ven: <19 mmHg — CL (ref 44.0–60.0)
pH, Ven: 7.08 — CL (ref 7.250–7.430)
pO2, Ven: 65 mmHg — ABNORMAL HIGH (ref 32.0–45.0)

## 2021-03-08 LAB — BRAIN NATRIURETIC PEPTIDE: B Natriuretic Peptide: 603.7 pg/mL — ABNORMAL HIGH (ref 0.0–100.0)

## 2021-03-08 LAB — RESP PANEL BY RT-PCR (FLU A&B, COVID) ARPGX2
Influenza A by PCR: NEGATIVE
Influenza B by PCR: NEGATIVE
SARS Coronavirus 2 by RT PCR: NEGATIVE

## 2021-03-08 LAB — LIPASE, BLOOD: Lipase: 74 U/L — ABNORMAL HIGH (ref 11–51)

## 2021-03-08 MED ORDER — ASPIRIN EC 81 MG PO TBEC
81.0000 mg | DELAYED_RELEASE_TABLET | Freq: Every day | ORAL | Status: DC
  Administered 2021-03-08 – 2021-03-14 (×7): 81 mg via ORAL
  Filled 2021-03-08 (×7): qty 1

## 2021-03-08 MED ORDER — ACETAMINOPHEN 650 MG RE SUPP: 650.0000 mg | Freq: Four times a day (QID) | RECTAL | Status: DC | PRN

## 2021-03-08 MED ORDER — ASPIRIN EC 81 MG PO TBEC: 81.0000 mg | DELAYED_RELEASE_TABLET | Freq: Every day | ORAL | Status: DC

## 2021-03-08 MED ORDER — INSULIN REGULAR(HUMAN) IN NACL 100-0.9 UT/100ML-% IV SOLN: INTRAVENOUS | Status: DC

## 2021-03-08 MED ORDER — VANCOMYCIN HCL IN DEXTROSE 1-5 GM/200ML-% IV SOLN
1000.0000 mg | Freq: Once | INTRAVENOUS | Status: AC
  Administered 2021-03-08: 1000 mg via INTRAVENOUS
  Filled 2021-03-08: qty 200

## 2021-03-08 MED ORDER — LACTATED RINGERS IV SOLN: INTRAVENOUS | Status: DC

## 2021-03-08 MED ORDER — VANCOMYCIN VARIABLE DOSE PER UNSTABLE RENAL FUNCTION (PHARMACIST DOSING): Status: DC

## 2021-03-08 MED ORDER — ONDANSETRON HCL 4 MG/2ML IJ SOLN
4.0000 mg | Freq: Three times a day (TID) | INTRAMUSCULAR | Status: DC | PRN
  Administered 2021-03-08: 4 mg via INTRAVENOUS
  Filled 2021-03-08: qty 2

## 2021-03-08 MED ORDER — LEVOTHYROXINE SODIUM 50 MCG PO TABS
25.0000 ug | ORAL_TABLET | Freq: Every day | ORAL | Status: DC
  Administered 2021-03-09 – 2021-03-14 (×6): 25 ug via ORAL
  Filled 2021-03-08 (×6): qty 1

## 2021-03-08 MED ORDER — INSULIN REGULAR(HUMAN) IN NACL 100-0.9 UT/100ML-% IV SOLN
INTRAVENOUS | Status: DC
  Administered 2021-03-08: 3 [IU]/h via INTRAVENOUS
  Filled 2021-03-08 (×2): qty 100

## 2021-03-08 MED ORDER — ENOXAPARIN SODIUM 30 MG/0.3ML ~~LOC~~ SOLN
30.0000 mg | SUBCUTANEOUS | Status: DC
  Administered 2021-03-08: 30 mg via SUBCUTANEOUS
  Filled 2021-03-08: qty 0.3

## 2021-03-08 MED ORDER — LACTATED RINGERS IV BOLUS
500.0000 mL | Freq: Once | INTRAVENOUS | Status: AC
  Administered 2021-03-08: 500 mL via INTRAVENOUS

## 2021-03-08 MED ORDER — ACETAMINOPHEN 325 MG PO TABS: 650.0000 mg | ORAL_TABLET | Freq: Four times a day (QID) | ORAL | Status: DC | PRN

## 2021-03-08 MED ORDER — ATORVASTATIN CALCIUM 20 MG PO TABS
80.0000 mg | ORAL_TABLET | Freq: Every day | ORAL | Status: DC
  Administered 2021-03-08 – 2021-03-14 (×7): 80 mg via ORAL
  Filled 2021-03-08 (×7): qty 4

## 2021-03-08 MED ORDER — LACTATED RINGERS IV BOLUS (SEPSIS)
2000.0000 mL | Freq: Once | INTRAVENOUS | Status: AC
  Administered 2021-03-08: 2000 mL via INTRAVENOUS

## 2021-03-08 MED ORDER — LACTATED RINGERS IV BOLUS
1000.0000 mL | Freq: Once | INTRAVENOUS | Status: AC
  Administered 2021-03-08: 1000 mL via INTRAVENOUS

## 2021-03-08 MED ORDER — DEXTROSE 50 % IV SOLN: 0.0000 mL | INTRAVENOUS | Status: DC | PRN

## 2021-03-08 MED ORDER — CALCIUM CARBONATE-VITAMIN D 500-200 MG-UNIT PO TABS
1.0000 | ORAL_TABLET | Freq: Every day | ORAL | Status: DC
  Administered 2021-03-09 – 2021-03-14 (×6): 1 via ORAL
  Filled 2021-03-08 (×6): qty 1

## 2021-03-08 MED ORDER — CHLORHEXIDINE GLUCONATE CLOTH 2 % EX PADS
6.0000 | MEDICATED_PAD | Freq: Every day | CUTANEOUS | Status: DC
  Administered 2021-03-09 – 2021-03-13 (×5): 6 via TOPICAL

## 2021-03-08 MED ORDER — SODIUM CHLORIDE 0.9 % IV SOLN
2.0000 g | INTRAVENOUS | Status: DC
  Administered 2021-03-09: 2 g via INTRAVENOUS
  Filled 2021-03-08 (×2): qty 2

## 2021-03-08 MED ORDER — LACTATED RINGERS IV BOLUS: 500.0000 mL | Freq: Once | INTRAVENOUS | Status: DC

## 2021-03-08 MED ORDER — CALCIUM GLUCONATE-NACL 1-0.675 GM/50ML-% IV SOLN
1.0000 g | Freq: Once | INTRAVENOUS | Status: DC
  Filled 2021-03-08: qty 50

## 2021-03-08 MED ORDER — DEXTROSE IN LACTATED RINGERS 5 % IV SOLN: INTRAVENOUS | Status: DC

## 2021-03-08 MED ORDER — SODIUM CHLORIDE 0.9 % IV SOLN
2.0000 g | Freq: Once | INTRAVENOUS | Status: AC
  Administered 2021-03-08: 2 g via INTRAVENOUS
  Filled 2021-03-08: qty 2

## 2021-03-08 MED ORDER — METRONIDAZOLE IN NACL 5-0.79 MG/ML-% IV SOLN
500.0000 mg | Freq: Once | INTRAVENOUS | Status: AC
Start: 2021-03-08 — End: 2021-03-08
  Administered 2021-03-08: 500 mg via INTRAVENOUS
  Filled 2021-03-08: qty 100

## 2021-03-08 MED ORDER — TAMOXIFEN CITRATE 10 MG PO TABS
20.0000 mg | ORAL_TABLET | Freq: Every day | ORAL | Status: DC
  Administered 2021-03-09 – 2021-03-14 (×6): 20 mg via ORAL
  Filled 2021-03-08 (×6): qty 2

## 2021-03-08 MED ORDER — HEPARIN (PORCINE) 25000 UT/250ML-% IV SOLN
650.0000 [IU]/h | INTRAVENOUS | Status: DC
  Administered 2021-03-08: 650 [IU]/h via INTRAVENOUS
  Filled 2021-03-08: qty 250

## 2021-03-08 MED ORDER — DEXTROSE 50 % IV SOLN
0.0000 mL | INTRAVENOUS | Status: DC | PRN
  Administered 2021-03-13: 25 mL via INTRAVENOUS

## 2021-03-08 NOTE — Consult Note (Signed)
ANTICOAGULATION CONSULT NOTE - Initial Consult  Pharmacy Consult for heparin infusion Indication: chest pain/ACS  Allergies  Allergen Reactions  . Lisinopril Rash and Other (See Comments)    Hyponatremia  Hyponatremia  Hyponatremia   . Sulfa Antibiotics Other (See Comments)    possible possible Patient doesn't know   . Sulfasalazine     Patient Measurements: Height: 5\' 4"  (162.6 cm) Weight: 54.4 kg (120 lb) IBW/kg (Calculated) : 54.7 Heparin Dosing Weight: 54.4 kg    Labs: Recent Labs    03/08/21 0813 03/08/21 1055 03/08/21 1236 03/08/21 1432  HGB 10.8*  --   --   --   HCT 35.1*  --   --   --   PLT 241  --   --   --   CREATININE 2.45* 2.30*  --  2.10*  TROPONINIHS  --   --  26,528* >27,000*    Estimated Creatinine Clearance: 17.7 mL/min (A) (by C-G formula based on SCr of 2.1 mg/dL (H)).   Medical History: Past Medical History:  Diagnosis Date  . Breast cancer ([place]) 2011   RT MASTECTOMY  . Cancer ([place]) 2011   BREAST CA  . diabetes insulin dep   . High cholesterol   . Hypertension   . Hypothyroidism   . Osteoporosis   . Thyroid disease     Medications:  Enoxaparin 30 mg SQ given 4/20 1204   Assessment: 80 y.o. female with history of type I diabetes presented with altered mental status. Code sepsis protocol was called cardiology consult called due to troponin drawn was 26,528. Pharmacy has been consulted for heparin initiation and management for ACS.   Goal of Therapy:  Heparin level 0.3-0.7 units/ml Monitor platelets by anticoagulation protocol: Yes   Plan:   Patient received 30 mg enoxaparin dose 1204, expect enoxaparin effect to peak ~ 3-5 hours  Will initiate heparin infusion @ 650 units/hr with no bolus due to peak enoxaparin effect   Check HL 8 hours following initiation  CBC daily  Dorothe Pea, PharmD, BCPS Clinical Pharmacist  03/08/2021,4:12 PM

## 2021-03-08 NOTE — ED Notes (Signed)
Lab called:  Potassium 6.6 Lactic 8.7 Glucose 988   Dr Archie Balboa informed

## 2021-03-08 NOTE — Plan of Care (Signed)
PMT note:  Spoke with husband. Plans established to meet husband at bedside tomorrow morning after 10:00 for GOC.   No charge note.

## 2021-03-08 NOTE — ED Notes (Signed)
CBG "HI" on glucometer.

## 2021-03-08 NOTE — ED Triage Notes (Addendum)
Pt to ED from home via [facility] Husband called EMS for diabetic emergency. CBG read "HIGH" on EMS glucometer Pt has eyes open but not responding verbally to questions EMS VS: 100.7 axillary, RR30, SBP90s, HR 130s, ETCO2 14, SPO2 95 on RA  CBG on this ED glucometer reading "HI"

## 2021-03-08 NOTE — Sepsis Progress Note (Signed)
Code Sepsis protocol being monitored by eLink. 

## 2021-03-08 NOTE — ED Notes (Signed)
Dr Blaine Hamper leaving room. Dr Blaine Hamper aware of low BP. Last reading is 93/38, MAP of 54.

## 2021-03-08 NOTE — ED Provider Notes (Signed)
[facility] Emergency Department Provider Note  ____________________________________________   I have reviewed the triage vital signs and the nursing notes.   HISTORY  Chief Complaint Hyperglycemia   History limited by and level 5 caveat due to: AMS  HPI [name] [name] is a 80 y.o. female who presents to the emergency department today via EMS because of concern for AMS and possible diabetic complication. Per EMS the husband told them that he noticed the patient wasn't feeling well around 300 AM this morning. She is typically awake alert and oriented. This morning patient became more altered. Patient does have history of diabetes and husband was worried that this was causing her symptoms. For EMS the patient was found to have a fever and blood sugar that read high.     Records reviewed. Per medical record review patient has a history of HTN, HLD, DM.   Past Medical History:  Diagnosis Date  . Breast cancer ([place]) 2011   RT MASTECTOMY  . Cancer ([place]) 2011   BREAST CA  . diabetes insulin dep   . High cholesterol   . Hypertension   . Hypothyroidism   . Osteoporosis   . Thyroid disease     Patient Active Problem List   Diagnosis Date Noted  . Erroneous encounter - disregard 10/09/2019  . Pain due to onychomycosis of toenails of both feet 05/14/2019  . Porokeratosis 05/14/2019  . Encephalopathy acute 11/22/2017  . Pneumonia, community acquired 11/22/2017  . Pseudogout 11/22/2017  . Protein-calorie malnutrition, severe 11/14/2017  . AKI (acute kidney injury) ([place]) 11/13/2017  . DKA (diabetic ketoacidoses) 07/31/2017  . Hyponatremia 06/18/2016  . Osteoporosis 04/23/2016  . Breast cancer ([place]) 03/30/2016  . Hyperlipemia 03/30/2016  . BP (high blood pressure) 03/30/2016  . Hypothyroidism 03/30/2016  . Type 1 diabetes mellitus with hypoglycemia and without coma ([place]) 02/10/2016  . Type 1 diabetes mellitus without complication ([place]) 68/10/7516  .  Dermatophytosis of nail 08/24/2013  . Pain in lower limb 08/24/2013    Past Surgical History:  Procedure Laterality Date  . BREAST BIOPSY Right 2011   positive  . CATARACT EXTRACTION W/PHACO Right 06/13/2016   Procedure: CATARACT EXTRACTION PHACO AND INTRAOCULAR LENS PLACEMENT (IOC);  Surgeon: Estill Cotta, MD;  Location: [facility] ORS;  Service: Ophthalmology;  Laterality: Right;  [place] 03:07 AP% 27.4 CDE 93.21 fluid pack lot # [id number]  . MASTECTOMY Right 2011   positive  . TONSILLECTOMY    . TOTAL ABDOMINAL HYSTERECTOMY      Prior to Admission medications   Medication Sig Start Date End Date Taking? Authorizing Provider  Calcium Carbonate-Vitamin D 500-125 MG-UNIT TABS Take by mouth.    [provider]  denosumab (PROLIA) 60 MG/ML SOLN injection Inject 60 mg into the skin every 6 (six) months. 05/14/16   [provider]  enalapril (VASOTEC) 20 MG tablet TAKE 1 TABLET TWICE A DAY 08/25/18   [provider]  glucose blood (ONETOUCH VERIO) test strip Use 3 (three) times daily. ONE TOUCH VERIO TEST STRIPS. E10.9 05/08/17   [provider]  insulin aspart (NOVOLOG) 100 UNIT/ML injection Inject 2-5 units up to three times daily as directed 12/11/19   [provider]  insulin glargine (LANTUS) 100 UNIT/ML injection Inject 0.1 mLs (10 Units total) into the skin daily. 11/19/17   Epifanio Lesches, MD  levothyroxine (SYNTHROID) 25 MCG tablet Take by mouth. 06/06/20   [provider]  lovastatin (MEVACOR) 20 MG tablet TAKE 1 TABLET DAILY 12/15/18  [provider]  tamoxifen (NOLVADEX) 20 MG tablet TAKE 1 TABLET DAILY 09/23/18   Lequita Asal, MD    Allergies Lisinopril, Sulfa antibiotics, and Sulfasalazine  Family History  Problem Relation Age of Onset  . Heart disease Father        heart attack  . Breast cancer Neg Hx     Social History Social History   Tobacco Use  . Smoking status: Never Smoker  . Smokeless  tobacco: Never Used  Substance Use Topics  . Alcohol use: No  . Drug use: No    Review of Systems Unable to obtain secondary to altered mental status.  ____________________________________________   PHYSICAL EXAM:  VITAL SIGNS: ED Triage Vitals  Enc Vitals Group     BP 03/08/21 0816 (!) 110/39     Pulse Rate 03/08/21 0816 (!) 125     Resp --      Temp 03/08/21 0816 98.5 F (36.9 C)     Temp Source 03/08/21 0816 Axillary     SpO2 03/08/21 0816 97 %     Weight 03/08/21 0805 120 lb (54.4 kg)     Height 03/08/21 0805 5\' 4"  (1.626 m)   Constitutional: Somnolent. Awakens to verbal stimuli.  Eyes: Conjunctivae are normal.  ENT      Head: Normocephalic and atraumatic.      Nose: No congestion/rhinnorhea.      Mouth/Throat: Mucous membranes are moist.      Neck: No stridor. Hematological/Lymphatic/Immunilogical: No cervical lymphadenopathy. Cardiovascular: Tachycardic, regular rhythm.  No murmurs, rubs, or gallops.  Respiratory: Normal respiratory effort without tachypnea nor retractions. Breath sounds are clear and equal bilaterally. No wheezes/rales/rhonchi. Gastrointestinal: Soft and non tender. No rebound. No guarding.  Genitourinary: Deferred Musculoskeletal: Normal range of motion in all extremities. No lower extremity edema. Neurologic:  Somnolent, awakens to verbal stimuli. Not answering questions or following commands at this time.   Skin:  Skin is warm, dry and intact. No rash noted. Psychiatric: Mood and affect are normal. Speech and behavior are normal. Patient exhibits appropriate insight and judgment.  ____________________________________________    LABS (pertinent positives/negatives)  CBC wbc 22.3, hgb 10.8, plt 241 VBG pH 7.08 CMP na 132, k 6.6, glu 988, cr 2.45 Lactic acid 8.7 ____________________________________________   EKG  I, Nance Pear, attending physician, personally viewed and interpreted this EKG  EKG Time: 0819 Rate: 125 Rhythm:  sinus tachycardia Axis: right axis deviation Intervals: qtc 407 QRS: narrow ST changes: no st elevation, depression II, III, aVF, V3-V6 Impression: abnormal ekg ____________________________________________    RADIOLOGY  CXR No acute cardiopulmonary disease  ____________________________________________   PROCEDURES  Procedures  CRITICAL CARE Performed by: Nance Pear   Total critical care time: 40 minutes  Critical care time was exclusive of separately billable procedures and treating other patients.  Critical care was necessary to treat or prevent imminent or life-threatening deterioration.  Critical care was time spent personally by me on the following activities: development of treatment plan with patient and/or surrogate as well as nursing, discussions with consultants, evaluation of patient's response to treatment, examination of patient, obtaining history from patient or surrogate, ordering and performing treatments and interventions, ordering and review of laboratory studies, ordering and review of radiographic studies, pulse oximetry and re-evaluation of patient's condition.  ____________________________________________   INITIAL IMPRESSION / ASSESSMENT AND PLAN / ED COURSE  Pertinent labs & imaging results that were available during my care of the patient were reviewed by me and considered in my medical  decision making (see chart for details).   Patient presented to the emergency department today because of concerns for altered mental status and possible diabetic complication.  For EMS her blood sugar was high.  Upon arrival to the emergency department patient was quite somnolent.  Did have concern for possible hyperglycemic crisis.  Also had concern for possible sepsis given low blood pressure and tachycardia.  Blood work started to return concerning for elevated white count as well as lactic acidosis.  While these could be explained by possible DKA I did have  continued concerns for sepsis so broad-spectrum antibiotics were initiated.  Patient's chemistry was consistent with DKA with significantly elevated blood sugar and low bicarb.  Patient was started on an insulin drip.  Patient did not require any potassium repletion given elevated potassium on chemistry.  On reassessment patient's mental status had greatly improved.  She is now much more awake alert and oriented.  She is able to verbalize.  Discussed findings and concern for DKA and sepsis with patient and husband he did arrive to the emergency department.  Will plan on admission.  ____________________________________________   FINAL CLINICAL IMPRESSION(S) / ED DIAGNOSES  Final diagnoses:  Hyperglycemia  Altered mental status, unspecified altered mental status type     Note: This dictation was prepared with Dragon dictation. Any transcriptional errors that result from this process are unintentional     Nance Pear, MD 03/08/21 1045

## 2021-03-08 NOTE — Progress Notes (Signed)
Pt admitted to ICU 20 in no acute distress at this time, denies pain. Alert and oriented to self and place but not time or situation. BP soft, verbal order for 500cc LR bolus administered. Insulin gtt managed per endo tool. 19 beat run V tach, MD made aware, troponins ordered and EKG ordered.

## 2021-03-08 NOTE — ED Notes (Signed)
Gave report via telephone to [facility], ICU RN.

## 2021-03-08 NOTE — Consult Note (Signed)
Cardiology Consultation Note    Patient ID: [name] [name], MRN: [id number], DOB/AGE: [birth year] 82 y.o. Admit date: 03/08/2021   Date of Consult: 03/08/2021 Primary Physician: Tracie Harrier, MD Primary Cardiologist: none  Chief Complaint: weakness/septic Reason for Consultation: abnormal troponin Requesting MD: Dr. Blaine Hamper  HPI: [name] [name] is a 80 y.o. female with history of type I diabetes who was noted by her husband to have altered mental status today.  He noticed the patient was not feeling well around 3 AM.  She became increasingly less awake and alert.  She was brought to the emergency room where she was noted to have a blood pressure 110/39 pulse 125 temperature of 98.5 pulse ox of 97%.  Her glucose was 988, her creatinine 2.45 potassium 6.6, sodium is 132 pH was 7.08 hemoglobin 10.8 white count 22.3.  Lactic acid was 8.7.  Code sepsis protocol was called cardiology consult called due to troponin drawn was 26,528.  Patient not able to give history to complain of chest pain.  EKG showed sinus tachycardia with no ischemia.  Chest x-ray showed no acute cardiopulmonary disease urinalysis showed positive ketones and proteinuria rare bacteria hemoglobin A1c was 8.9.  Patient was placed on broad-spectrum empiric antibiotics.  Started on Lipitor 80 mg daily as well as aspirin due to the troponin.  Head CT showed no acute abnormalities.  Patient denies any chest pain or history of chest pain.  Again EKG is unremarkable for ischemia.  Elevated troponin likely secondary to sepsis and acute renal insufficiency.  Denies ever having chest pain and husband denies her complaining of this.  She has no prior cardiac history.  Past Medical History:  Diagnosis Date  . Breast cancer ([place]) 2011   RT MASTECTOMY  . Cancer ([place]) 2011   BREAST CA  . diabetes insulin dep   . High cholesterol   . Hypertension   . Hypothyroidism   . Osteoporosis   . Thyroid disease       Surgical History:  Past Surgical  History:  Procedure Laterality Date  . BREAST BIOPSY Right 2011   positive  . CATARACT EXTRACTION W/PHACO Right 06/13/2016   Procedure: CATARACT EXTRACTION PHACO AND INTRAOCULAR LENS PLACEMENT (IOC);  Surgeon: Estill Cotta, MD;  Location: [facility] ORS;  Service: Ophthalmology;  Laterality: Right;  [place] 03:07 AP% 27.4 CDE 93.21 fluid pack lot # [id number]  . MASTECTOMY Right 2011   positive  . TONSILLECTOMY    . TOTAL ABDOMINAL HYSTERECTOMY       Home Meds: Prior to Admission medications   Medication Sig Start Date End Date Taking? Authorizing Provider  Calcium Carbonate-Vitamin D 500-125 MG-UNIT TABS Take by mouth.    [provider]  denosumab (PROLIA) 60 MG/ML SOLN injection Inject 60 mg into the skin every 6 (six) months. 05/14/16   [provider]  enalapril (VASOTEC) 20 MG tablet TAKE 1 TABLET TWICE A DAY 08/25/18   [provider]  glucose blood (ONETOUCH VERIO) test strip Use 3 (three) times daily. ONE TOUCH VERIO TEST STRIPS. E10.9 05/08/17   [provider]  insulin aspart (NOVOLOG) 100 UNIT/ML injection Inject 2-5 units up to three times daily as directed 12/11/19   [provider]  insulin glargine (LANTUS) 100 UNIT/ML injection Inject 0.1 mLs (10 Units total) into the skin daily. 11/19/17   Epifanio Lesches, MD  levothyroxine (SYNTHROID) 25 MCG tablet Take by mouth. 06/06/20   [provider]  lovastatin (MEVACOR) 20 MG tablet TAKE 1 TABLET  DAILY 12/15/18   [provider]  tamoxifen (NOLVADEX) 20 MG tablet TAKE 1 TABLET DAILY 09/23/18   Lequita Asal, MD    Inpatient Medications:  . aspirin EC  81 mg Oral Daily  . atorvastatin  80 mg Oral Daily  . enoxaparin (LOVENOX) injection  30 mg Subcutaneous Q24H  . vancomycin variable dose per unstable renal function (pharmacist dosing)   Does not apply See admin instructions   . calcium gluconate    . [START ON 03/09/2021] ceFEPime (MAXIPIME) IV    . dextrose 5%  lactated ringers    . insulin 3.4 Units/hr (03/08/21 1332)  . lactated ringers 125 mL/hr at 03/08/21 1250    Allergies:  Allergies  Allergen Reactions  . Lisinopril Rash and Other (See Comments)    Hyponatremia  Hyponatremia  Hyponatremia   . Sulfa Antibiotics Other (See Comments)    possible possible Patient doesn't know   . Sulfasalazine     Social History   Socioeconomic History  . Marital status: Married    Spouse name: Not on file  . Number of children: Not on file  . Years of education: Not on file  . Highest education level: Not on file  Occupational History  . Not on file  Tobacco Use  . Smoking status: Never Smoker  . Smokeless tobacco: Never Used  Substance and Sexual Activity  . Alcohol use: No  . Drug use: No  . Sexual activity: Not on file  Other Topics Concern  . Not on file  Social History Narrative  . Not on file   Social Determinants of Health   Financial Resource Strain: Not on file  Food Insecurity: Not on file  Transportation Needs: Not on file  Physical Activity: Not on file  Stress: Not on file  Social Connections: Not on file  Intimate Partner Violence: Not on file     Family History  Problem Relation Age of Onset  . Heart disease Father        heart attack  . Breast cancer Neg Hx      Review of Systems: A 12-system review of systems was performed and is negative except as noted in the HPI.  Labs: No results for input(s): CKTOTAL, CKMB, TROPONINI in the last 72 hours. Lab Results  Component Value Date   WBC 22.3 (H) 03/08/2021   HGB 10.8 (L) 03/08/2021   HCT 35.1 (L) 03/08/2021   MCV 108.7 (H) 03/08/2021   PLT 241 03/08/2021    Recent Labs  Lab 03/08/21 0813 03/08/21 1055  NA 132* 134*  K 6.6* 4.9  CL 91* 100  CO2 <7* <7*  BUN 40* 37*  CREATININE 2.45* 2.30*  CALCIUM 9.0 7.7*  PROT 6.3*  --   BILITOT 2.1*  --   ALKPHOS 40  --   ALT 17  --   AST 36  --   GLUCOSE 988* 783*   Lab Results  Component Value  Date   CHOL 217 (H) 06/19/2016   HDL 73 06/19/2016   LDLCALC 114 (H) 06/19/2016   TRIG 152 (H) 06/19/2016   No results found for: DDIMER  Radiology/Studies:  [facility] Chest Port 1 View  Result Date: 03/08/2021 CLINICAL DATA:  Questionable sepsis. EXAM: PORTABLE CHEST 1 VIEW COMPARISON:  11/15/2017. FINDINGS: Patient rotated to the right. Mild prominence of the right upper mediastinum most likely related to patient rotation and prominent great vessels. Heart size normal. No focal infiltrate. No pleural effusion or pneumothorax. Stable deformity  right scapula. Old right rib fractures again noted. Surgical clips right chest. IMPRESSION: No acute cardiopulmonary disease. Electronically Signed   By: Marcello Moores  Register   On: 03/08/2021 08:31    Wt Readings from Last 3 Encounters:  03/08/21 54.4 kg  02/21/21 45.7 kg  08/23/20 45.9 kg    EKG: Normal sinus rhythm with no ischemia  Physical Exam: Chronically and acutely ill-appearing female in no acute distress relatively hypotensive Blood pressure (!) 89/49, pulse (!) 109, temperature (!) 97.2 F (36.2 C), temperature source Oral, resp. rate 18, height 5\' 4"  (1.626 m), weight 54.4 kg, SpO2 100 %. Body mass index is 20.6 kg/m.  Head: Normocephalic, atraumatic, sclera non-icteric, no xanthomas, nares are without discharge.  Neck: Negative for carotid bruits. JVD not elevated. Lungs: Clear bilaterally to auscultation without wheezes, rales, or rhonchi. Breathing is unlabored. Heart: RRR with S1 S2. No murmurs, rubs, or gallops appreciated. Abdomen: Soft, non-tender, non-distended with normoactive bowel sounds. No hepatomegaly. No rebound/guarding. No obvious abdominal masses. Msk:  Strength and tone appear normal for age. Extremities: No clubbing or cyanosis. No edema.  Distal pedal pulses are 2+ and equal bilaterally. Neuro: Alert and oriented X 3. No facial asymmetry. No focal deficit. Moves all extremities spontaneously. Psych:  Responds to  questions appropriately with a normal affect.     Assessment and Plan  80 year old female with type 1 diabetes who presented to the ER with altered mental status.  She was acidotic with a pH of 7.02 mild elevated temperature.  Pulse was 125 glucose was 988 creatinine increased to 2.45 potassium 6.6.  Lactate was 8.7.  Head CT was unremarkable.  Serum troponin apparently drawn due to altered mental status showed a count of 26,000.  EKG showed no ischemia and patient had no chest pain.  1.  Elevated troponin-etiology likely multifactorial.  Clinical picture does not support acute coronary event.  Patient has no chest pain.  No EKG changes consistent with ischemia.  She has acute renal insufficiency, DKA, hyperkalemia.  Not a candidate for urgent cardiac catheterization.  This could be considered later however this event and the elevated troponin does not appear to be related to an acute coronary event.  We will need to treat the sepsis, renal insufficiency and correct electrolyte abnormalities.  Agree with IV heparin.  Will need consideration for invasive evaluation once stabilized from a sepsis/acute renal insufficiency standpoint.  At this point patient would be high risk for renal failure if contrast was used..  Echocardiogram will need to be done to evaluate LV function.  2.  DKA-sugar was 988 with evidence of acidosis hyperkalemia.  This will need to be aggressively treated as you are doing.  3.  Benard Halsted may be secondary to infectious process.  On empiric antibiotics.  We will continue to follow.  Signed, Teodoro Spray MD 03/08/2021, 3:01 PM Pager: [phone]

## 2021-03-08 NOTE — ED Notes (Signed)
Husband at bedside.  

## 2021-03-08 NOTE — Progress Notes (Signed)
Inpatient Diabetes Program Recommendations  AACE/ADA: New Consensus Statement on Inpatient Glycemic Control (2015)  Target Ranges:  Prepandial:   less than 140 mg/dL      Peak postprandial:   less than 180 mg/dL (1-2 hours)      Critically ill patients:  140 - 180 mg/dL   Lab Results  Component Value Date   GLUCAP >600 ([place]) 03/08/2021   HGBA1C 9.0 (H) 07/31/2017    Review of Glycemic Control Results for [name] [name], [name] [name] (MRN [id number]) as of 03/08/2021 11:23  Ref. Range 03/08/2021 08:13  Sodium Latest Ref Range: 135 - 145 mmol/L 132 (L)  Potassium Latest Ref Range: 3.5 - 5.1 mmol/L 6.6 (HH)  Chloride Latest Ref Range: 98 - 111 mmol/L 91 (L)  CO2 Latest Ref Range: 22 - 32 mmol/L <7 (L)  Glucose Latest Ref Range: 70 - 99 mg/dL 988 (HH)  BUN Latest Ref Range: 8 - 23 mg/dL 40 (H)  Creatinine Latest Ref Range: 0.44 - 1.00 mg/dL 2.45 (H)  Calcium Latest Ref Range: 8.9 - 10.3 mg/dL 9.0  Anion gap Latest Ref Range: 5 - 15  NOT CALCULATED  Alkaline Phosphatase Latest Ref Range: 38 - 126 U/L 40  Albumin Latest Ref Range: 3.5 - 5.0 g/dL 4.0  AST Latest Ref Range: 15 - 41 U/L 36  ALT Latest Ref Range: 0 - 44 U/L 17  Total Protein Latest Ref Range: 6.5 - 8.1 g/dL 6.3 (L)  Total Bilirubin Latest Ref Range: 0.3 - 1.2 mg/dL 2.1 (H)  GFR, Estimated Latest Ref Range: >60 mL/min 19 (L)   Diabetes history: DM1 Outpatient Diabetes medications: Lantus 4 units qd + Novolog Current orders for Inpatient glycemic control: IV insulin  Inpatient Diabetes Program Recommendations:   Patient sees Dr. Gabriel Carina for endocrinology and last office visit was 12/16/20 with following note: "Her current diabetes regimen is: Lantus insulin (VIAL) 4 units daily NovoLog insulin (VIAL): If sugar before meal is 70 - 150 -- take 4 units If sugar before the meal is 151-200 -- take 5 units If sugar before the meal is 201-250 -- take 7 units If sugar before the meal is 251-300 -- take 9 units If sugar before the meal is over  301 -- take 10 units"  Will follow and assist as needed.  Thank you, Nani Gasser. Thayer Inabinet, RN, MSN, CDE  Diabetes Coordinator Inpatient Glycemic Control Team Team Pager [phone] (8am-5pm) 03/08/2021 11:23 AM

## 2021-03-08 NOTE — H&P (Addendum)
History and Physical    [name] [name] ZWC:[id number] DOB: October 19, 1941 DOA: 03/08/2021  Referring MD/NP/PA:   PCP: Tracie Harrier, MD   Patient coming from:  The patient is coming from home.  At baseline, pt is independent for most of ADL.        Chief Complaint: AMS, nausea, vomiting, elevated blood sugar  HPI: [name] [name] is a 80 y.o. female with medical history significant of type 1 diabetes, DKA, hypertension, hyperlipidemia, breast cancer (s/p right mastectomy), hypothyroidism, who presents with altered mental status, nausea, vomiting, elevated blood sugar.  Per her husband at bedside, patient's blood sugar has been elevated in the past 2 weeks.  Patient has not been feeling well since last night.  This morning patient became confused.  She normally is alert and oriented x3.  Per report, patient was initially unresponsive, her mental status has gradually improved in the emergency room. When I saw pt in ED, she is still confused, but knows her own name and knows that she is in hospital. She is confused about time.  Per husband, patient has nausea and vomited at least 4 times with nonbilious nonbloody vomiting.  Does not seem to have abdominal pain or diarrhea.  Patient has been feeling hot.  Her body temperature is 98.5 in ED.  Patient has increased urinary frequency, but did not complain of dysuria or burning on urination.  She moves all extremities.  No facial droop.  She seems to have slurred speech.  Patient was initially hypotensive with blood pressure 79/46, which improved to 90/50 after giving 2 L normal saline bolus in ED. Pre RN report, pt had one episode of 19 beats of NSVT in ED.  ED Course: pt was found to have WBC 22, blood sugar 988, bicarbonate <7.0, anion gap not calculated, beta hydroxybutyric acid> 8.0, UA negative for UTI, but positive for ketone 80, negative COVID PCR, potassium 6.6, AKI with creatinine 2.45, BUN 40 (creatinine 0.8 on 12/12/2020), temperature 98.8,  tachycardic with heart rate of 129, RR 33, oxygen saturation 95% on room air.  Chest x-ray negative.  Patient is admitted to stepdown as inpatient  Review of Systems: Could not be reviewed accurately due to altered mental status.   Allergy:  Allergies  Allergen Reactions  . Lisinopril Rash and Other (See Comments)    Hyponatremia  Hyponatremia  Hyponatremia   . Sulfa Antibiotics Other (See Comments)    possible possible Patient doesn't know   . Sulfasalazine     Past Medical History:  Diagnosis Date  . Breast cancer ([place]) 2011   RT MASTECTOMY  . Cancer ([place]) 2011   BREAST CA  . diabetes insulin dep   . High cholesterol   . Hypertension   . Hypothyroidism   . Osteoporosis   . Thyroid disease     Past Surgical History:  Procedure Laterality Date  . BREAST BIOPSY Right 2011   positive  . CATARACT EXTRACTION W/PHACO Right 06/13/2016   Procedure: CATARACT EXTRACTION PHACO AND INTRAOCULAR LENS PLACEMENT (IOC);  Surgeon: Estill Cotta, MD;  Location: [facility] ORS;  Service: Ophthalmology;  Laterality: Right;  [place] 03:07 AP% 27.4 CDE 93.21 fluid pack lot # [id number]  . MASTECTOMY Right 2011   positive  . TONSILLECTOMY    . TOTAL ABDOMINAL HYSTERECTOMY      Social History:  reports that she has never smoked. She has never used smokeless tobacco. She reports that she does not drink alcohol and does not use drugs.  Family History:  Family History  Problem Relation Age of Onset  . Heart disease Father        heart attack  . Breast cancer Neg Hx      Prior to Admission medications   Medication Sig Start Date End Date Taking? Authorizing Provider  Calcium Carbonate-Vitamin D 500-125 MG-UNIT TABS Take by mouth.    [provider]  denosumab (PROLIA) 60 MG/ML SOLN injection Inject 60 mg into the skin every 6 (six) months. 05/14/16   [provider]  enalapril (VASOTEC) 20 MG tablet TAKE 1 TABLET TWICE A DAY 08/25/18   [provider]  glucose blood  (ONETOUCH VERIO) test strip Use 3 (three) times daily. ONE TOUCH VERIO TEST STRIPS. E10.9 05/08/17   [provider]  insulin aspart (NOVOLOG) 100 UNIT/ML injection Inject 2-5 units up to three times daily as directed 12/11/19   [provider]  insulin glargine (LANTUS) 100 UNIT/ML injection Inject 0.1 mLs (10 Units total) into the skin daily. 11/19/17   Epifanio Lesches, MD  levothyroxine (SYNTHROID) 25 MCG tablet Take by mouth. 06/06/20   [provider]  lovastatin (MEVACOR) 20 MG tablet TAKE 1 TABLET DAILY 12/15/18   [provider]  tamoxifen (NOLVADEX) 20 MG tablet TAKE 1 TABLET DAILY 09/23/18   Lequita Asal, MD    Physical Exam: Vitals:   03/08/21 1045 03/08/21 1100 03/08/21 1115 03/08/21 1200  BP: (!) 93/38 (!) 94/44 (!) 90/55 (!) 96/39  Pulse: (!) 124 (!) 123 (!) 119 (!) 116  Resp: (!) 23 (!) 21 (!) 22 18  Temp:    (!) 97.2 F (36.2 C)  TempSrc:    Oral  SpO2: 100% 100% 100% 100%  Weight:      Height:    5\' 4"  (1.626 m)   General: Not in acute distress.  Dry mucous membranes HEENT:       Eyes: PERRL, EOMI, no scleral icterus.       ENT: No discharge from the ears and nose.       Neck: No JVD, no bruit, no mass felt. Heme: No neck lymph node enlargement. Cardiac: S1/S2, RRR, No murmurs, No gallops or rubs. Respiratory: No rales, wheezing, rhonchi or rubs. GI: Soft, nondistended, nontender, no organomegaly, BS present. GU: No hematuria Ext: No pitting leg edema bilaterally. 1+DP/PT pulse bilaterally. Musculoskeletal: No joint deformities, No joint redness or warmth, no limitation of ROM in spin. Skin: No rashes.  Neuro: Confused, knows her own name, knows that she is in hospital, is not oriented to time, cranial nerves II-XII grossly intact, moves all extremities. Psych: Patient is not psychotic, no suicidal or hemocidal ideation.  Labs on Admission: I have personally reviewed following labs and imaging studies  CBC: Recent Labs   Lab 03/08/21 0813  WBC 22.3*  NEUTROABS 17.1*  HGB 10.8*  HCT 35.1*  MCV 108.7*  PLT 646   Basic Metabolic Panel: Recent Labs  Lab 03/08/21 0813 03/08/21 1055  NA 132* 134*  K 6.6* 4.9  CL 91* 100  CO2 <7* <7*  GLUCOSE 988* 783*  BUN 40* 37*  CREATININE 2.45* 2.30*  CALCIUM 9.0 7.7*   GFR: Estimated Creatinine Clearance: 16.2 mL/min (A) (by C-G formula based on SCr of 2.3 mg/dL (H)). Liver Function Tests: Recent Labs  Lab 03/08/21 0813  AST 36  ALT 17  ALKPHOS 40  BILITOT 2.1*  PROT 6.3*  ALBUMIN 4.0   Recent Labs  Lab 03/08/21 1055  LIPASE 74*   No results  for input(s): AMMONIA in the last 168 hours. Coagulation Profile: No results for input(s): INR, PROTIME in the last 168 hours. Cardiac Enzymes: No results for input(s): CKTOTAL, CKMB, CKMBINDEX, TROPONINI in the last 168 hours. BNP (last 3 results) No results for input(s): PROBNP in the last 8760 hours. HbA1C: No results for input(s): HGBA1C in the last 72 hours. CBG: Recent Labs  Lab 03/08/21 0810 03/08/21 1008 03/08/21 1043 03/08/21 1212 03/08/21 1248  GLUCAP >600* >600* >600* >600* >600*   Lipid Profile: No results for input(s): CHOL, HDL, LDLCALC, TRIG, CHOLHDL, LDLDIRECT in the last 72 hours. Thyroid Function Tests: No results for input(s): TSH, T4TOTAL, FREET4, T3FREE, THYROIDAB in the last 72 hours. Anemia Panel: No results for input(s): VITAMINB12, FOLATE, FERRITIN, TIBC, IRON, RETICCTPCT in the last 72 hours. Urine analysis:    Component Value Date/Time   COLORURINE YELLOW (A) 03/08/2021 1100   APPEARANCEUR HAZY (A) 03/08/2021 1100   LABSPEC 1.018 03/08/2021 1100   PHURINE 5.0 03/08/2021 1100   GLUCOSEU >=500 (A) 03/08/2021 1100   HGBUR MODERATE (A) 03/08/2021 1100   BILIRUBINUR NEGATIVE 03/08/2021 1100   KETONESUR 80 (A) 03/08/2021 1100   PROTEINUR 30 (A) 03/08/2021 1100   NITRITE NEGATIVE 03/08/2021 1100   LEUKOCYTESUR NEGATIVE 03/08/2021 1100   Sepsis  Labs: @LABRCNTIP (procalcitonin:4,lacticidven:4) ) Recent Results (from the past 240 hour(s))  Resp Panel by RT-PCR (Flu A&B, Covid) Nasopharyngeal Swab     Status: None   Collection Time: 03/08/21  8:13 AM   Specimen: Nasopharyngeal Swab; Nasopharyngeal(NP) swabs in vial transport medium  Result Value Ref Range Status   SARS Coronavirus 2 by RT PCR NEGATIVE NEGATIVE Final    Comment: (NOTE) SARS-CoV-2 target nucleic acids are NOT DETECTED.  The SARS-CoV-2 RNA is generally detectable in upper respiratory specimens during the acute phase of infection. The lowest concentration of SARS-CoV-2 viral copies this assay can detect is 138 copies/mL. A negative result does not preclude SARS-Cov-2 infection and should not be used as the sole basis for treatment or other patient management decisions. A negative result may occur with  improper specimen collection/handling, submission of specimen other than nasopharyngeal swab, presence of viral mutation(s) within the areas targeted by this assay, and inadequate number of viral copies(<138 copies/mL). A negative result must be combined with clinical observations, patient history, and epidemiological information. The expected result is Negative.  Fact Sheet for Patients:  EntrepreneurPulse.com.au  Fact Sheet for Healthcare Providers:  IncredibleEmployment.be  This test is no t yet approved or cleared by the [place] FDA and  has been authorized for detection and/or diagnosis of SARS-CoV-2 by FDA under an Emergency Use Authorization (EUA). This EUA will remain  in effect (meaning this test can be used) for the duration of the COVID-19 declaration under Section 564(b)(1) of the Act, 21 U.S.C.section 360bbb-3(b)(1), unless the authorization is terminated  or revoked sooner.       Influenza A by PCR NEGATIVE NEGATIVE Final   Influenza B by PCR NEGATIVE NEGATIVE Final    Comment: (NOTE) The Xpert Xpress  SARS-CoV-2/FLU/RSV plus assay is intended as an aid in the diagnosis of influenza from Nasopharyngeal swab specimens and should not be used as a sole basis for treatment. Nasal washings and aspirates are unacceptable for Xpert Xpress SARS-CoV-2/FLU/RSV testing.  Fact Sheet for Patients: EntrepreneurPulse.com.au  Fact Sheet for Healthcare Providers: IncredibleEmployment.be  This test is not yet approved or cleared by the [place] FDA and has been authorized for detection and/or diagnosis of SARS-CoV-2 by FDA under  an Emergency Use Authorization (EUA). This EUA will remain in effect (meaning this test can be used) for the duration of the COVID-19 declaration under Section 564(b)(1) of the Act, 21 U.S.C. section 360bbb-3(b)(1), unless the authorization is terminated or revoked.  Performed at [facility], [address] [place], [place] [address]      Radiological Exams on Admission: DG Chest [facility] 1 View  Result Date: 03/08/2021 CLINICAL DATA:  Questionable sepsis. EXAM: PORTABLE CHEST 1 VIEW COMPARISON:  11/15/2017. FINDINGS: Patient rotated to the right. Mild prominence of the right upper mediastinum most likely related to patient rotation and prominent great vessels. Heart size normal. No focal infiltrate. No pleural effusion or pneumothorax. Stable deformity right scapula. Old right rib fractures again noted. Surgical clips right chest. IMPRESSION: No acute cardiopulmonary disease. Electronically Signed   By: Marcello Moores  Register   On: 03/08/2021 08:31     EKG: I have personally reviewed.  Sinus rhythm, QTC 401, low voltage, ST depression in inferior leads in the V3-V6   Assessment/Plan Principal Problem:   DKA (diabetic ketoacidosis) ([place]) Active Problems:   Breast cancer ([place])   Hyperlipemia   Hypothyroidism   Type 1 diabetes mellitus without complication (HCC)   AKI (acute kidney injury) ([place])   HTN (hypertension)   Acute  metabolic encephalopathy   SIRS (systemic inflammatory response syndrome) (HCC)   Hyperkalemia   Nausea & vomiting   NSVT (nonsustained ventricular tachycardia) ([place])   DKA (diabetic ketoacidosis) ([place]): Patient initially was unresponsive, mental status has improved gradually. blood sugar 988, bicarbonate <7.0, anion gap not calculated, beta hydroxybutyric acid> 8.0, UA negative positive for ketone 80.  - Admit to stepdown as inpt - 3.5L of LR bolus - start DKA protocol with BMP q4h - IVF: LR at 125 cc/h, will switch to D5-LR at 125 cc/h when CBG<250 - replete K as needed - Zofran prn nausea  - NPO  - consult to diabetic educator  SIRS (systemic inflammatory response syndrome): Patient admits culture for SIRS with leukocytosis with WBC 22.3, tachycardic with heart rate of 129, tachypnea with RR 32.  Lactic acid 8.7 --> 8.0 --> 7.1.  No source of infection identified, therefore cannot be diagnosed as sepsis now.  Urinalysis negative.  Chest x-ray negative. If source of infection is identified later, diagnosis will be changed to sepsis.  Procalcitonin 0.58.  -Empiric antibiotics: Vancomycin and cefepime (patient received 1 dose of Flagyl in ED) -Follow-up blood culture and urine culture -will trend lactic acid levels per sepsis protocol. -IVF: 3.5L of LR bolus in ED, followed by 125 cc/h   Hyperkalemia: K 6.6, due to DKA -Expect correction with insulin drip  Acute metabolic encephalopathy: Likely multifactorial etiology, including DKA, significant elevated blood sugar, electrolytes disturbance, SIRS, AKI -Frequent neuro check -Follow-up CT of head -->negative  History of breast cancer [facility]): S/p of right mastectomy -Tamoxifen  Hyperlipemia -on Statin, lipitor  Hypothyroidism -Synthroid  Type 1 diabetes mellitus without complication ([place]): Poorly controlled.  Recent A1c 8.0.  Now has DKA.  Patient is taking NovoLog and Lantus at home -On DKA protocol -Consult diabetic  educator  AKI (acute kidney injury) ([place]): Likely due to dehydration and continuation of enalapril.  ATN is also possible given hypotension -Follow-up US-renal -IV fluid as above -Avoid using renal toxic medications -Hold enalapril  HTN (hypertension) -Holding enalapril due to hypotension  Nausea & vomiting: No abdominal pain.  Abdomen is benign on examination.  May be due to DKA -Check lipase --> 74 -IV  fluids will -As needed Zofran  NSVT (nonsustained ventricular tachycardia) [facility]): Patient also has abnormal EKG with ST depression in the inferior leads and V3-V6. -check trop x 3 -on statin -cardiac monitoring  Addendum: trop level 55374 --> pt has NSTEMI. Dr. Ubaldo Glassing of card is consulted -IV heparin - change lovastatin to lipitor 80 mg daily -ASA 81 mg daily -trend trop -A1c and FLP -I called her daughter who is very supportive, and confirmed patient's DNR status.  Daughter is awere that patient is in critical condition and may deteriorate quickly.  She agreed with palliative consult      DVT ppx: SQ Lovenox -->IV heparin Code Status: DNR per her husband (I discussed with her husband in the presence of nurse, and explained the meaning of [place]. Patient would want to be DNR per her husband).  Family Communication:  Yes, patient's  Husband at bed side Disposition Plan:  Anticipate discharge back to previous environment Consults called:  Dr. Ubaldo Glassing of card Admission status and Level of care: Stepdown:      SDU/inpation        Status is: Inpatient  Remains inpatient appropriate because:Inpatient level of care appropriate due to severity of illness   Dispo: The patient is from: Home              Anticipated d/c is to: Home              Patient currently is not medically stable to d/c.   Difficult to place patient No          Date of Service 03/08/2021    [facility]   If 7PM-7AM, please contact night-coverage [email or web address]:  03/08/2021,  1:23 PM

## 2021-03-08 NOTE — ED Notes (Signed)
Dr Niu at bedside 

## 2021-03-08 NOTE — Sepsis Progress Note (Signed)
elink monitoring code sepsis.  

## 2021-03-08 NOTE — Progress Notes (Signed)
PHARMACY -  BRIEF ANTIBIOTIC NOTE   Pharmacy has received consult(s) for Vancomyicn and Cefepime from an ED provider.  The patient's profile has been reviewed for ht/wt/allergies/indication/available labs.    One time order(s) placed by MD for Vancomycin 1 gm and Cefepime 2 gm  Further antibiotics/pharmacy consults should be ordered by admitting physician if indicated.                       Thank you, Mahina Salatino A 03/08/2021  8:57 AM

## 2021-03-08 NOTE — Consult Note (Signed)
Pharmacy Antibiotic Note  [name] [name] is a 80 y.o. female admitted on 03/08/2021 with sepsis. Pt presented with N/V and elevated blood glucose. Pharmacy has been consulted for vancomycin and cefepime dosing.   Plan: Patient received vancomycin 1000 mg IV x 1 in ED Will order a vanc random level in 12 hours to assess clearance and re-dose if necessary  Cefepime 2 g IV q24h   Monitor clinical picture, renal function, vanc levels as needed  F/U C&S, abx deescalation / LOT   Height: 5\' 4"  (162.6 cm) Weight: 54.4 kg (120 lb) IBW/kg (Calculated) : 54.7  Temp (24hrs), Avg:98.5 F (36.9 C), Min:98.5 F (36.9 C), Max:98.5 F (36.9 C)  Recent Labs  Lab 03/08/21 0813 03/08/21 1008 03/08/21 1010  WBC 22.3*  --   --   CREATININE 2.45*  --   --   LATICACIDVEN  --  8.7* 8.0*    Estimated Creatinine Clearance: 15.2 mL/min (A) (by C-G formula based on SCr of 2.45 mg/dL (H)).    Allergies  Allergen Reactions  . Lisinopril Rash and Other (See Comments)    Hyponatremia  Hyponatremia  Hyponatremia   . Sulfa Antibiotics Other (See Comments)    possible possible Patient doesn't know   . Sulfasalazine     Antimicrobials this admission: 4/20 Vancomycin >>  4/20 Cefepime >>  4/20 Flagyl 500 mg IV x1 in ED   Dose adjustments this admission:  Microbiology results: 4/20 BCx: pending 4/20 UCx: pending   Thank you for allowing pharmacy to be a part of this patient's care.  Darnelle Bos 03/08/2021 11:03 AM

## 2021-03-09 ENCOUNTER — Inpatient Hospital Stay
Admit: 2021-03-09 | Discharge: 2021-03-09 | Disposition: A | Discharge status: Home / Self Care | Payer: PPO | Attending: Cardiology | Admitting: Cardiology

## 2021-03-09 DIAGNOSIS — Z7189 Other specified counseling: Secondary | ICD-10-CM

## 2021-03-09 DIAGNOSIS — I214 Non-ST elevation (NSTEMI) myocardial infarction: Secondary | ICD-10-CM

## 2021-03-09 LAB — BASIC METABOLIC PANEL
Anion gap: 7 (ref 5–15)
BUN: 33 mg/dL — ABNORMAL HIGH (ref 8–23)
CO2: 22 mmol/L (ref 22–32)
Calcium: 8.1 mg/dL — ABNORMAL LOW (ref 8.9–10.3)
Chloride: 107 mmol/L (ref 98–111)
Creatinine, Ser: 1.42 mg/dL — ABNORMAL HIGH (ref 0.44–1.00)
GFR, Estimated: 37 mL/min — ABNORMAL LOW (ref >60)
Glucose, Bld: 156 mg/dL — ABNORMAL HIGH (ref 70–99)
Potassium: 3.5 mmol/L (ref 3.5–5.1)
Sodium: 136 mmol/L (ref 135–145)

## 2021-03-09 LAB — URINE CULTURE: Culture: NO GROWTH

## 2021-03-09 LAB — LIPID PANEL
Cholesterol: 137 mg/dL (ref 0–200)
HDL: 76 mg/dL (ref >40)
LDL Cholesterol: 48 mg/dL (ref 0–99)
Total CHOL/HDL Ratio: 1.8 RATIO
Triglycerides: 64 mg/dL (ref <150)
VLDL: 13 mg/dL (ref 0–40)

## 2021-03-09 LAB — CBC
HCT: 30.4 % — ABNORMAL LOW (ref 36.0–46.0)
Hemoglobin: 10.5 g/dL — ABNORMAL LOW (ref 12.0–15.0)
MCH: 33.1 pg (ref 26.0–34.0)
MCHC: 34.5 g/dL (ref 30.0–36.0)
MCV: 95.9 fL (ref 80.0–100.0)
Platelets: 192 10*3/uL (ref 150–400)
RBC: 3.17 MIL/uL — ABNORMAL LOW (ref 3.87–5.11)
RDW: 13.5 % (ref 11.5–15.5)
WBC: 20.8 10*3/uL — ABNORMAL HIGH (ref 4.0–10.5)
nRBC: 0 % (ref 0.0–0.2)

## 2021-03-09 LAB — HEMOGLOBIN A1C
Hgb A1c MFr Bld: 9 % — ABNORMAL HIGH (ref 4.8–5.6)
Mean Plasma Glucose: 211.6 mg/dL

## 2021-03-09 LAB — HEPARIN LEVEL (UNFRACTIONATED)
Heparin Unfractionated: 0.98 IU/mL — ABNORMAL HIGH (ref 0.30–0.70)
Heparin Unfractionated: 0.84 IU/mL — ABNORMAL HIGH (ref 0.30–0.70)

## 2021-03-09 LAB — VANCOMYCIN, RANDOM: Vancomycin Rm: 12

## 2021-03-09 LAB — GLUCOSE, CAPILLARY
Glucose-Capillary: 119 mg/dL — ABNORMAL HIGH (ref 70–99)
Glucose-Capillary: 146 mg/dL — ABNORMAL HIGH (ref 70–99)
Glucose-Capillary: 147 mg/dL — ABNORMAL HIGH (ref 70–99)
Glucose-Capillary: 148 mg/dL — ABNORMAL HIGH (ref 70–99)
Glucose-Capillary: 167 mg/dL — ABNORMAL HIGH (ref 70–99)
Glucose-Capillary: 173 mg/dL — ABNORMAL HIGH (ref 70–99)
Glucose-Capillary: 188 mg/dL — ABNORMAL HIGH (ref 70–99)
Glucose-Capillary: 189 mg/dL — ABNORMAL HIGH (ref 70–99)
Glucose-Capillary: 253 mg/dL — ABNORMAL HIGH (ref 70–99)

## 2021-03-09 LAB — MRSA PCR SCREENING: MRSA by PCR: NEGATIVE

## 2021-03-09 MED ORDER — INSULIN GLARGINE 100 UNIT/ML ~~LOC~~ SOLN
10.0000 [IU] | Freq: Every day | SUBCUTANEOUS | Status: DC
  Administered 2021-03-09 – 2021-03-12 (×3): 10 [IU] via SUBCUTANEOUS
  Filled 2021-03-09 (×7): qty 0.1

## 2021-03-09 MED ORDER — ENOXAPARIN SODIUM 30 MG/0.3ML ~~LOC~~ SOLN
30.0000 mg | SUBCUTANEOUS | Status: DC
  Administered 2021-03-09: 30 mg via SUBCUTANEOUS
  Filled 2021-03-09: qty 0.3

## 2021-03-09 MED ORDER — HEPARIN (PORCINE) 25000 UT/250ML-% IV SOLN
550.0000 [IU]/h | INTRAVENOUS | Status: DC
  Administered 2021-03-09: 550 [IU]/h via INTRAVENOUS

## 2021-03-09 MED ORDER — LACTATED RINGERS IV SOLN: INTRAVENOUS | Status: DC

## 2021-03-09 MED ORDER — INSULIN ASPART 100 UNIT/ML ~~LOC~~ SOLN
0.0000 [IU] | Freq: Three times a day (TID) | SUBCUTANEOUS | Status: DC
  Administered 2021-03-09: 2 [IU] via SUBCUTANEOUS
  Administered 2021-03-09: 8 [IU] via SUBCUTANEOUS
  Administered 2021-03-09: 2 [IU] via SUBCUTANEOUS
  Administered 2021-03-10 (×3): 8 [IU] via SUBCUTANEOUS
  Administered 2021-03-10: 5 [IU] via SUBCUTANEOUS
  Administered 2021-03-11: 8 [IU] via SUBCUTANEOUS
  Administered 2021-03-11: 2 [IU] via SUBCUTANEOUS
  Administered 2021-03-12: 15 [IU] via SUBCUTANEOUS
  Administered 2021-03-12 (×2): 5 [IU] via SUBCUTANEOUS
  Filled 2021-03-09 (×12): qty 1

## 2021-03-09 MED ORDER — VANCOMYCIN HCL 750 MG IV SOLR
750.0000 mg | Freq: Once | INTRAVENOUS | Status: AC
  Administered 2021-03-09: 750 mg via INTRAVENOUS
  Filled 2021-03-09: qty 750

## 2021-03-09 NOTE — Progress Notes (Addendum)
PROGRESS NOTE    [name] [name]  EYC:[id number] DOB: November 17, 1941 DOA: 03/08/2021 PCP: Tracie Harrier, MD    Assessment & Plan:   Principal Problem:   DKA (diabetic ketoacidosis) ([place]) Active Problems:   Breast cancer ([place])   Hyperlipemia   Hypothyroidism   Type 1 diabetes mellitus without complication ([place])   AKI (acute kidney injury) ([place])   HTN (hypertension)   Acute metabolic encephalopathy   SIRS (systemic inflammatory response syndrome) ([facility])   Hyperkalemia   Nausea & vomiting   NSVT (nonsustained ventricular tachycardia) ([facility])   NSTEMI (non-ST elevated myocardial infarction) ([place])  SIRS: meets criteria w/ leukocytosis, tachycardic, tachypnea, & elevated lactic acid & no source of infection identified.  UA is neg. CXR is neg. Urinalysis negative. Procalcitonin 0.58. Blood cxs NGTD. Urine cx shows no growth  NSTEMI: d/c IV heparin as per cardio. Not currently a candidate for cardiac cath as per cardio. Continue on tele. Cardio is following and recs apprec  Hyperkalemia: resolved   Acute metabolic encephalopathy: likely secondary to DKA vs possible infection. Continue w/ neuro checks. CT head shows no acute intracranial findings   Leukocytosis: reactive vs infection. Continue on IV cefepime.  Macrocytic anemia: no need for a transfusion currently. Will check folate & B12 level  History of breast cancer: s/p of right mastectomy. Continue on home dose of tamoxifen   HLD: continue on statin   Hypothyroidism: continue on home dose of synthroid   DM1: poorly controlled w/ HbA1c 8.1. D/c insulin drip as anion gap has closed. Continue on lantus, SSI w/ accuchecks   AKI: possibly secondary to dehydration & ATN. Continue to hold ACE-I.   HTN: continue to hold home dose of enalapril secondary to hypotension   DVT prophylaxis: lovenox  Code Status: full  Family Communication:  Disposition Plan: depends on PT/OT recs. Will be a family meeting today w/ palliative  care   Level of care: Stepdown   Status is: Inpatient  Remains inpatient appropriate because:Ongoing diagnostic testing needed not appropriate for outpatient work up, Unsafe d/c plan, IV treatments appropriate due to intensity of illness or inability to take PO and Inpatient level of care appropriate due to severity of illness   Dispo: The patient is from: Home              Anticipated d/c is to: unclear, as there will be a family meeting today w/ palliative care              Patient currently is not medically stable to d/c.   Difficult to place patient unclear   Consultants:   Cardio    Procedures:   Antimicrobials: cefepime   Subjective: Pt c/o fatigue   Objective: Vitals:   03/09/21 0400 03/09/21 0500 03/09/21 0600 03/09/21 0700  BP: (!) 102/56 (!) 101/55 (!) 126/58 (!) 101/53  Pulse: 86 81 80 78  Resp: 12 17 17 14   Temp:      TempSrc:      SpO2: 99% 100% 99% 100%  Weight:      Height:        Intake/Output Summary (Last 24 hours) at 03/09/2021 0803 Last data filed at 03/09/2021 0435 Gross per 24 hour  Intake 5703.11 ml  Output 150 ml  Net 5553.11 ml   Filed Weights   03/08/21 0805  Weight: 54.4 kg    Examination:  General exam: Appears calm and comfortable  Respiratory system: Clear to auscultation. Respiratory effort normal. Cardiovascular system: S1 & S2 +. No  rubs, gallops or clicks.  Gastrointestinal system: Abdomen is nondistended, soft and nontender.Normal bowel sounds heard. Central nervous system: Alert and awake. Moves all extremities  Psychiatry: Judgement and insight appear abnormal. Flat mood and affect      Data Reviewed: I have personally reviewed following labs and imaging studies  CBC: Recent Labs  Lab 03/08/21 0813 03/09/21 0242  WBC 22.3* 20.8*  NEUTROABS 17.1*  --   HGB 10.8* 10.5*  HCT 35.1* 30.4*  MCV 108.7* 95.9  PLT 241 518   Basic Metabolic Panel: Recent Labs  Lab 03/08/21 1055 03/08/21 1432 03/08/21 1839  03/08/21 2257 03/09/21 0242  NA 134* 135 135 136 136  K 4.9 4.2 3.7 3.7 3.5  CL 100 102 105 106 107  CO2 <7* 11* 18* 20* 22  GLUCOSE 783* 561* 343* 198* 156*  BUN 37* 36* 32* 33* 33*  CREATININE 2.30* 2.10* 1.65* 1.56* 1.42*  CALCIUM 7.7* 8.0* 8.1* 8.3* 8.1*  MG  --  2.0  --   --   --    GFR: Estimated Creatinine Clearance: 26.2 mL/min (A) (by C-G formula based on SCr of 1.42 mg/dL (H)). Liver Function Tests: Recent Labs  Lab 03/08/21 0813  AST 36  ALT 17  ALKPHOS 40  BILITOT 2.1*  PROT 6.3*  ALBUMIN 4.0   Recent Labs  Lab 03/08/21 1055  LIPASE 74*   No results for input(s): AMMONIA in the last 168 hours. Coagulation Profile: No results for input(s): INR, PROTIME in the last 168 hours. Cardiac Enzymes: No results for input(s): CKTOTAL, CKMB, CKMBINDEX, TROPONINI in the last 168 hours. BNP (last 3 results) No results for input(s): PROBNP in the last 8760 hours. HbA1C: Recent Labs    03/08/21 1055  HGBA1C 8.9*   CBG: Recent Labs  Lab 03/09/21 0101 03/09/21 0158 03/09/21 0305 03/09/21 0358 03/09/21 0720  GLUCAP 189* 173* 148* 146* 167*   Lipid Profile: Recent Labs    03/09/21 0242  CHOL 137  HDL 76  LDLCALC 48  TRIG 64  CHOLHDL 1.8   Thyroid Function Tests: No results for input(s): TSH, T4TOTAL, FREET4, T3FREE, THYROIDAB in the last 72 hours. Anemia Panel: No results for input(s): VITAMINB12, FOLATE, FERRITIN, TIBC, IRON, RETICCTPCT in the last 72 hours. Sepsis Labs: Recent Labs  Lab 03/08/21 1055 03/08/21 1205 03/08/21 1432 03/08/21 2003 03/08/21 2257  PROCALCITON 5.86  --   --   --   --   LATICACIDVEN  --  7.1* 6.4* 3.2* 2.4*    Recent Results (from the past 240 hour(s))  Blood Culture (routine x 2)     Status: None (Preliminary result)   Collection Time: 03/08/21  8:13 AM   Specimen: BLOOD  Result Value Ref Range Status   Specimen Description BLOOD BLOOD RIGHT FOREARM  Final   Special Requests   Final    BOTTLES DRAWN AEROBIC AND  ANAEROBIC Blood Culture adequate volume   Culture   Final    NO GROWTH < 24 HOURS Performed at [facility], [address] [place], [place] [address]    Report Status PENDING  Incomplete  Blood Culture (routine x 2)     Status: None (Preliminary result)   Collection Time: 03/08/21  8:13 AM   Specimen: BLOOD  Result Value Ref Range Status   Specimen Description BLOOD BLOOD RIGHT HAND  Final   Special Requests   Final    BOTTLES DRAWN AEROBIC AND ANAEROBIC Blood Culture adequate volume   Culture   Final  NO GROWTH < 24 HOURS Performed at [facility], [address] [place], [place] [address]    Report Status PENDING  Incomplete  Resp Panel by RT-PCR (Flu A&B, Covid) Nasopharyngeal Swab     Status: None   Collection Time: 03/08/21  8:13 AM   Specimen: Nasopharyngeal Swab; Nasopharyngeal(NP) swabs in vial transport medium  Result Value Ref Range Status   SARS Coronavirus 2 by RT PCR NEGATIVE NEGATIVE Final    Comment: (NOTE) SARS-CoV-2 target nucleic acids are NOT DETECTED.  The SARS-CoV-2 RNA is generally detectable in upper respiratory specimens during the acute phase of infection. The lowest concentration of SARS-CoV-2 viral copies this assay can detect is 138 copies/mL. A negative result does not preclude SARS-Cov-2 infection and should not be used as the sole basis for treatment or other patient management decisions. A negative result may occur with  improper specimen collection/handling, submission of specimen other than nasopharyngeal swab, presence of viral mutation(s) within the areas targeted by this assay, and inadequate number of viral copies(<138 copies/mL). A negative result must be combined with clinical observations, patient history, and epidemiological information. The expected result is Negative.  Fact Sheet for Patients:  EntrepreneurPulse.com.au  Fact Sheet for Healthcare Providers:   IncredibleEmployment.be  This test is no t yet approved or cleared by the [place] FDA and  has been authorized for detection and/or diagnosis of SARS-CoV-2 by FDA under an Emergency Use Authorization (EUA). This EUA will remain  in effect (meaning this test can be used) for the duration of the COVID-19 declaration under Section 564(b)(1) of the Act, 21 U.S.C.section 360bbb-3(b)(1), unless the authorization is terminated  or revoked sooner.       Influenza A by PCR NEGATIVE NEGATIVE Final   Influenza B by PCR NEGATIVE NEGATIVE Final    Comment: (NOTE) The Xpert Xpress SARS-CoV-2/FLU/RSV plus assay is intended as an aid in the diagnosis of influenza from Nasopharyngeal swab specimens and should not be used as a sole basis for treatment. Nasal washings and aspirates are unacceptable for Xpert Xpress SARS-CoV-2/FLU/RSV testing.  Fact Sheet for Patients: EntrepreneurPulse.com.au  Fact Sheet for Healthcare Providers: IncredibleEmployment.be  This test is not yet approved or cleared by the [place] FDA and has been authorized for detection and/or diagnosis of SARS-CoV-2 by FDA under an Emergency Use Authorization (EUA). This EUA will remain in effect (meaning this test can be used) for the duration of the COVID-19 declaration under Section 564(b)(1) of the Act, 21 U.S.C. section 360bbb-3(b)(1), unless the authorization is terminated or revoked.  Performed at [facility], [address] [place], [place] [address]          Radiology Studies: CT HEAD WO CONTRAST  Result Date: 03/08/2021 CLINICAL DATA:  Mental status changes.  Diabetes and hypertension. EXAM: CT HEAD WITHOUT CONTRAST TECHNIQUE: Contiguous axial images were obtained from the base of the skull through the vertex without intravenous contrast. COMPARISON:  MRI 11/14/2017.  CT 06/18/2016. FINDINGS: Brain: Age related atrophy. Chronic  small-vessel ischemic changes of the hemispheric white matter. No sign of acute infarction, mass lesion, hemorrhage, hydrocephalus or extra-axial collection. Vascular: There is atherosclerotic calcification of the major vessels at the base of the brain. Skull: Negative Sinuses/Orbits: Clear/normal Other: None IMPRESSION: No acute finding by CT. Age related atrophy and chronic small-vessel ischemic changes of the white matter. Electronically Signed   By: Nelson Chimes M.D.   On: 03/08/2021 15:49   US RENAL  Result Date: 03/08/2021 CLINICAL DATA:  Acute  kidney injury EXAM: RENAL / URINARY TRACT ULTRASOUND COMPLETE COMPARISON:  None. FINDINGS: Right Kidney: Renal measurements: 9.9 x 4.5 x 3.3 cm = volume: 76.1 mL. Echogenicity within normal limits. No mass or hydronephrosis visualized. Left Kidney: Renal measurements: 9.4 x 4.2 x 4.2 cm = volume: 85.9 mL. Echogenicity within normal limits. No mass or hydronephrosis visualized. Bladder: Appears normal for degree of bladder distention. Other: None. IMPRESSION: Negative renal ultrasound Electronically Signed   By: Donavan Foil M.D.   On: 03/08/2021 18:26   DG Chest Port 1 View  Result Date: 03/08/2021 CLINICAL DATA:  Questionable sepsis. EXAM: PORTABLE CHEST 1 VIEW COMPARISON:  11/15/2017. FINDINGS: Patient rotated to the right. Mild prominence of the right upper mediastinum most likely related to patient rotation and prominent great vessels. Heart size normal. No focal infiltrate. No pleural effusion or pneumothorax. Stable deformity right scapula. Old right rib fractures again noted. Surgical clips right chest. IMPRESSION: No acute cardiopulmonary disease. Electronically Signed   By: Marcello Moores  Register   On: 03/08/2021 08:31        Scheduled Meds: . aspirin EC  81 mg Oral Daily  . atorvastatin  80 mg Oral Daily  . calcium-vitamin D  1 tablet Oral Daily  . Chlorhexidine Gluconate Cloth  6 each Topical Daily  . insulin aspart  0-15 Units Subcutaneous TID  AC & HS  . insulin glargine  10 Units Subcutaneous QHS  . levothyroxine  25 mcg Oral Q0600  . tamoxifen  20 mg Oral Daily  . vancomycin variable dose per unstable renal function (pharmacist dosing)   Does not apply See admin instructions   Continuous Infusions: . ceFEPime (MAXIPIME) IV    . dextrose 5% lactated ringers 125 mL/hr at 03/09/21 0617  . heparin 550 Units/hr (03/09/21 0435)  . insulin Stopped (03/09/21 0358)  . lactated ringers Stopped (03/08/21 2212)  . lactated ringers 50 mL/hr at 03/09/21 0435     LOS: 1 day    Time spent: 32 mins     Wyvonnia Dusky, MD [facility] Pager [phone]  If 7PM-7AM, please contact night-coverage 03/09/2021, 8:03 AM

## 2021-03-09 NOTE — Progress Notes (Addendum)
Patient Name: [name] [name] Date of Encounter: 03/09/2021  Hospital Problem List     Principal Problem:   DKA (diabetic ketoacidosis) ([place]) Active Problems:   Breast cancer ([place])   Hyperlipemia   Hypothyroidism   Type 1 diabetes mellitus without complication ([facility])   AKI (acute kidney injury) ([place])   HTN (hypertension)   Acute metabolic encephalopathy   SIRS (systemic inflammatory response syndrome) ([facility])   Hyperkalemia   Nausea & vomiting   NSVT (nonsustained ventricular tachycardia) ([facility])   NSTEMI (non-ST elevated myocardial infarction) [facility])    Patient Profile     80 y.o. female with history of type I diabetes who was noted by her husband to have altered mental status today.  He noticed the patient was not feeling well around 3 AM.  She became increasingly less awake and alert.  She was brought to the emergency room where she was noted to have a blood pressure 110/39 pulse 125 temperature of 98.5 pulse ox of 97%.  Her glucose was 988, her creatinine 2.45 potassium 6.6, sodium is 132 pH was 7.08 hemoglobin 10.8 white count 22.3.  Lactic acid was 8.7.  Code sepsis protocol was called cardiology consult called due to troponin drawn was 26,528.  Patient not able to give history to complain of chest pain.  EKG showed sinus tachycardia with no ischemia.  Chest x-ray showed no acute cardiopulmonary disease urinalysis showed positive ketones and proteinuria rare bacteria hemoglobin A1c was 8.9.  Patient was placed on broad-spectrum empiric antibiotics.  Started on Lipitor 80 mg daily as well as aspirin due to the troponin.  Head CT showed no acute abnormalities.  Patient denies any chest pain or history of chest pain.  Again EKG is unremarkable for ischemia.  Elevated troponin likely secondary to sepsis and acute renal insufficiency.  Denies ever having chest pain and husband denies her complaining of this.  She has no prior cardiac history.   Subjective   Denies chest pain.  Inpatient  Medications    . aspirin EC  81 mg Oral Daily  . atorvastatin  80 mg Oral Daily  . calcium-vitamin D  1 tablet Oral Daily  . Chlorhexidine Gluconate Cloth  6 each Topical Daily  . insulin aspart  0-15 Units Subcutaneous TID AC & HS  . insulin glargine  10 Units Subcutaneous QHS  . levothyroxine  25 mcg Oral Q0600  . tamoxifen  20 mg Oral Daily  . vancomycin variable dose per unstable renal function (pharmacist dosing)   Does not apply See admin instructions    Vital Signs    Vitals:   03/09/21 0700 03/09/21 0800 03/09/21 0900 03/09/21 1000  BP: (!) 101/53 (!) 103/56 (!) 109/98 (!) 96/58  Pulse: 78 78 82 78  Resp: 14 18 12 17   Temp:  99.1 F (37.3 C)    TempSrc:  Oral    SpO2: 100% 100% 100% 99%  Weight:      Height:        Intake/Output Summary (Last 24 hours) at 03/09/2021 1113 Last data filed at 03/09/2021 0800 Gross per 24 hour  Intake 4377.42 ml  Output 150 ml  Net 4227.42 ml   Filed Weights   03/08/21 0805  Weight: 54.4 kg    Physical Exam    GEN: Well nourished, well developed, in no acute distress.  HEENT: normal.  Neck: Supple, no JVD, carotid bruits, or masses. Cardiac: RRR, no murmurs, rubs, or gallops. No clubbing, cyanosis, edema.  Radials/DP/PT 2+  and equal bilaterally.  Respiratory:  Respirations regular and unlabored, clear to auscultation bilaterally. GI: Soft, nontender, nondistended, BS + x 4. MS: no deformity or atrophy. Skin: warm and dry, no rash. Neuro:  Strength and sensation are intact. Psych: Normal affect.  Labs    CBC Recent Labs    03/08/21 0813 03/09/21 0242  WBC 22.3* 20.8*  NEUTROABS 17.1*  --   HGB 10.8* 10.5*  HCT 35.1* 30.4*  MCV 108.7* 95.9  PLT 241 876   Basic Metabolic Panel Recent Labs    03/08/21 1432 03/08/21 1839 03/08/21 2257 03/09/21 0242  NA 135   < > 136 136  K 4.2   < > 3.7 3.5  CL 102   < > 106 107  CO2 11*   < > 20* 22  GLUCOSE 561*   < > 198* 156*  BUN 36*   < > 33* 33*  CREATININE 2.10*   <  > 1.56* 1.42*  CALCIUM 8.0*   < > 8.3* 8.1*  MG 2.0  --   --   --    < > = values in this interval not displayed.   Liver Function Tests Recent Labs    03/08/21 0813  AST 36  ALT 17  ALKPHOS 40  BILITOT 2.1*  PROT 6.3*  ALBUMIN 4.0   Recent Labs    03/08/21 1055  LIPASE 74*   Cardiac Enzymes No results for input(s): CKTOTAL, CKMB, CKMBINDEX, TROPONINI in the last 72 hours. BNP Recent Labs    03/08/21 0813  BNP 603.7*   D-Dimer No results for input(s): DDIMER in the last 72 hours. Hemoglobin A1C Recent Labs    03/09/21 0242  HGBA1C 9.0*   Fasting Lipid Panel Recent Labs    03/09/21 0242  CHOL 137  HDL 76  LDLCALC 48  TRIG 64  CHOLHDL 1.8   Thyroid Function Tests No results for input(s): TSH, T4TOTAL, T3FREE, THYROIDAB in the last 72 hours.  Invalid input(s): FREET3  Telemetry    Normal sinus rhythm with occasional nonsustained runs of SVT   ECG    Normal sinus rhythm with no ischemia  Radiology    CT HEAD WO CONTRAST  Result Date: 03/08/2021 CLINICAL DATA:  Mental status changes.  Diabetes and hypertension. EXAM: CT HEAD WITHOUT CONTRAST TECHNIQUE: Contiguous axial images were obtained from the base of the skull through the vertex without intravenous contrast. COMPARISON:  MRI 11/14/2017.  CT 06/18/2016. FINDINGS: Brain: Age related atrophy. Chronic small-vessel ischemic changes of the hemispheric white matter. No sign of acute infarction, mass lesion, hemorrhage, hydrocephalus or extra-axial collection. Vascular: There is atherosclerotic calcification of the major vessels at the base of the brain. Skull: Negative Sinuses/Orbits: Clear/normal Other: None IMPRESSION: No acute finding by CT. Age related atrophy and chronic small-vessel ischemic changes of the white matter. Electronically Signed   By: Nelson Chimes M.D.   On: 03/08/2021 15:49   US RENAL  Result Date: 03/08/2021 CLINICAL DATA:  Acute kidney injury EXAM: RENAL / URINARY TRACT ULTRASOUND  COMPLETE COMPARISON:  None. FINDINGS: Right Kidney: Renal measurements: 9.9 x 4.5 x 3.3 cm = volume: 76.1 mL. Echogenicity within normal limits. No mass or hydronephrosis visualized. Left Kidney: Renal measurements: 9.4 x 4.2 x 4.2 cm = volume: 85.9 mL. Echogenicity within normal limits. No mass or hydronephrosis visualized. Bladder: Appears normal for degree of bladder distention. Other: None. IMPRESSION: Negative renal ultrasound Electronically Signed   By: Donavan Foil M.D.   On: 03/08/2021 18:26  DG Chest Port 1 View  Result Date: 03/08/2021 CLINICAL DATA:  Questionable sepsis. EXAM: PORTABLE CHEST 1 VIEW COMPARISON:  11/15/2017. FINDINGS: Patient rotated to the right. Mild prominence of the right upper mediastinum most likely related to patient rotation and prominent great vessels. Heart size normal. No focal infiltrate. No pleural effusion or pneumothorax. Stable deformity right scapula. Old right rib fractures again noted. Surgical clips right chest. IMPRESSION: No acute cardiopulmonary disease. Electronically Signed   By: Marcello Moores  Register   On: 03/08/2021 08:31    Assessment & Plan    80 year old female with type 1 diabetes who presented to the ER with altered mental status.  She was acidotic with a pH of 7.02 mild elevated temperature.  Pulse was 125 glucose was 988 creatinine increased to 2.45 potassium 6.6.  Lactate was 8.7.  Head CT was unremarkable.  Serum troponin apparently drawn due to altered mental status showed a count of 27,000.  EKG showed no ischemia and patient had no chest pain.  1.  Elevated troponin-etiology likely multifactorial.  Clinical picture does not support acute coronary event.  Patient has no chest pain.  No EKG changes consistent with ischemia.  She has acute renal insufficiency, DKA, hyperkalemia.  Not a candidate for urgent cardiac catheterization.  This could be considered later however this event and the elevated troponin does not appear to be related to an  acute coronary event.  We will need to treat the sepsis, renal insufficiency and correct electrolyte abnormalities.    Will discontinue heparin.  Patient is not a candidate for cardiac catheterization at present.  This can be discussed in the future when stable from sepsis and hemodynamically however would attempt medical management given comorbidities. At this point patient would be high risk for renal failure if contrast was used..  Echocardiogram is pending.  2.  DKA-sugar was 988 with evidence of acidosis hyperkalemia.  This will need to be aggressively treated as you are doing.  3.  Benard Halsted may be secondary to infectious process.  On empiric antibiotics.  We will continue to follow.   Signed, Javier Docker Sharonann Malbrough MD 03/09/2021, 11:13 AM  Pager: [phone]  Dr. Clayborn Bigness will be covering my patients this weekend

## 2021-03-09 NOTE — Progress Notes (Signed)
*  PRELIMINARY RESULTS* Echocardiogram 2D Echocardiogram has been performed.  [name] [name] 03/09/2021, 8:43 AM

## 2021-03-09 NOTE — Consult Note (Addendum)
Consultation Note Date: 03/09/2021   Patient Name: [name] [name]  DOB: 1941-09-09  MRN: [id number]  Age / Sex: 80 y.o., female  PCP: Tracie Harrier, MD Referring Physician: Wyvonnia Dusky, MD  Reason for Consultation: Establishing goals of care  HPI/Patient Profile: [name] [name] is a 80 y.o. female with medical history significant of type 1 diabetes, DKA, hypertension, hyperlipidemia, breast cancer (s/p right mastectomy), hypothyroidism, who presents with altered mental status, nausea, vomiting, elevated blood sugar.  Clinical Assessment and Goals of Care:  Patient is resting in bed. She is able to answer basic questions, but looks to her husband to answer most of them. He states she has had cognitive/memory impairment for around 2 years. He states she cooks and he remains close by; he does other chores. He states she does her ADL's independently. He states her oral intake has not bee good.   We discussed her diagnoses, prognosis, GOC, EOL wishes disposition and options.  A detailed discussion was had today regarding advanced directives.  Concepts specific to code status, artifical feeding and hydration, IV antibiotics and rehospitalization were discussed.  The difference between an aggressive medical intervention path and a comfort care path was discussed.  Values and goals of care important to patient and family were attempted to be elicited.  Discussed limitations of medical interventions to prolong quality of life in some situations and discussed the concept of human mortality.  He states all of their advanced care planning has been completed. They would like to treat the treatable. He states she would not want CPR and "no machines." He states they would not want a ventilator, dialysis, a feeding tube, or an insulin pump. He states they do have an insurance plan for SNF care and would have an  acceptable QOL if they were to lose functional independence. They plan to follow up with endocrinology to further modify medications for her hyperglycemia.      SUMMARY OF RECOMMENDATIONS   Treat the treatable. No machines of any kind. Recommend outpatient palliative. I will f/u Monday if still here.   Prognosis:   Poor     Primary Diagnoses: Present on Admission: . DKA (diabetic ketoacidosis) ([place]) . AKI (acute kidney injury) ([place]) . Breast cancer ([place]) . Hyperlipemia . Hypothyroidism . Type 1 diabetes mellitus without complication ([place]) . HTN (hypertension) . Acute metabolic encephalopathy . SIRS (systemic inflammatory response syndrome) ([facility]) . Hyperkalemia . Nausea & vomiting . NSTEMI (non-ST elevated myocardial infarction) ([place])   I have reviewed the medical record, interviewed the patient and family, and examined the patient. The following aspects are pertinent.  Past Medical History:  Diagnosis Date  . Breast cancer ([place]) 2011   RT MASTECTOMY  . Cancer ([place]) 2011   BREAST CA  . diabetes insulin dep   . High cholesterol   . Hypertension   . Hypothyroidism   . Osteoporosis   . Thyroid disease    Social History   Socioeconomic History  . Marital status: Married    Spouse name:  Not on file  . Number of children: Not on file  . Years of education: Not on file  . Highest education level: Not on file  Occupational History  . Not on file  Tobacco Use  . Smoking status: Never Smoker  . Smokeless tobacco: Never Used  Substance and Sexual Activity  . Alcohol use: No  . Drug use: No  . Sexual activity: Not on file  Other Topics Concern  . Not on file  Social History Narrative  . Not on file   Social Determinants of Health   Financial Resource Strain: Not on file  Food Insecurity: Not on file  Transportation Needs: Not on file  Physical Activity: Not on file  Stress: Not on file  Social Connections: Not on file   Family History  Problem  Relation Age of Onset  . Heart disease Father        heart attack  . Breast cancer Neg Hx    Scheduled Meds: . aspirin EC  81 mg Oral Daily  . atorvastatin  80 mg Oral Daily  . calcium-vitamin D  1 tablet Oral Daily  . Chlorhexidine Gluconate Cloth  6 each Topical Daily  . insulin aspart  0-15 Units Subcutaneous TID AC & HS  . insulin glargine  10 Units Subcutaneous QHS  . levothyroxine  25 mcg Oral Q0600  . tamoxifen  20 mg Oral Daily  . vancomycin variable dose per unstable renal function (pharmacist dosing)   Does not apply See admin instructions   Continuous Infusions: . ceFEPime (MAXIPIME) IV 2 g (03/09/21 0931)  . dextrose 5% lactated ringers 125 mL/hr at 03/09/21 0800  . insulin Stopped (03/09/21 0358)  . lactated ringers Stopped (03/08/21 2212)  . lactated ringers 50 mL/hr at 03/09/21 0800   PRN Meds:.acetaminophen, acetaminophen, dextrose, ondansetron (ZOFRAN) IV Medications Prior to Admission:  Prior to Admission medications   Medication Sig Start Date End Date Taking? Authorizing Provider  Calcium Carbonate-Vitamin D 500-125 MG-UNIT TABS Take by mouth.   Yes [provider]  enalapril (VASOTEC) 20 MG tablet TAKE 1 TABLET TWICE A DAY 08/25/18  Yes [provider]  insulin aspart (NOVOLOG) 100 UNIT/ML injection Inject 2-5 units up to three times daily as directed 12/11/19  Yes [provider]  insulin glargine (LANTUS) 100 UNIT/ML injection Inject 0.1 mLs (10 Units total) into the skin daily. 11/19/17  Yes Epifanio Lesches, MD  levothyroxine (SYNTHROID) 25 MCG tablet Take by mouth. 06/06/20  Yes [provider]  lovastatin (MEVACOR) 20 MG tablet TAKE 1 TABLET DAILY 12/15/18  Yes [provider]  tamoxifen (NOLVADEX) 20 MG tablet TAKE 1 TABLET DAILY 09/23/18  Yes Lequita Asal, MD  denosumab (PROLIA) 60 MG/ML SOLN injection Inject 60 mg into the skin every 6 (six) months. 05/14/16   [provider]  glucose blood  (ONETOUCH VERIO) test strip Use 3 (three) times daily. ONE TOUCH VERIO TEST STRIPS. E10.9 05/08/17   [provider]   Allergies  Allergen Reactions  . Lisinopril Rash and Other (See Comments)    Hyponatremia  Hyponatremia  Hyponatremia   . Sulfa Antibiotics Other (See Comments)    possible possible Patient doesn't know   . Sulfasalazine    Review of Systems  All other systems reviewed and are negative.   Physical Exam Pulmonary:     Effort: Pulmonary effort is normal.  Neurological:     Mental Status: She is alert.     Vital Signs: BP (!) 98/54  Pulse 78   Temp 99.1 F (37.3 C) (Oral)   Resp 20   Ht 5\' 4"  (1.626 m)   Wt 54.4 kg   LMP  (LMP Unknown)   SpO2 99%   BMI 20.60 kg/m  Pain Scale: 0-10   Pain Score: 0-No pain   SpO2: SpO2: 99 % O2 Device:SpO2: 99 % O2 Flow Rate: .   IO: Intake/output summary:   Intake/Output Summary (Last 24 hours) at 03/09/2021 1212 Last data filed at 03/09/2021 0800 Gross per 24 hour  Intake 4377.42 ml  Output 50 ml  Net 4327.42 ml    LBM: Last BM Date:  (PTA) Baseline Weight: Weight: 54.4 kg Most recent weight: Weight: 54.4 kg     Palliative Assessment/Data:     Time In: 10:35 Time Out: 11:30 Time Total: 55 min Greater than 50%  of this time was spent counseling and coordinating care related to the above assessment and plan.  Signed by: Asencion Gowda, NP   Please contact Palliative Medicine Team phone at [phone] for questions and concerns.  For individual provider: See Shea Evans

## 2021-03-09 NOTE — Evaluation (Signed)
Physical Therapy Evaluation Patient Details Name: [name] [name] MRN: [id number] DOB: [birth year] Today's Date: 03/09/2021   History of Present Illness  [name] [name] is a 80 y.o. female with medical history significant of type 1 diabetes, DKA, hypertension, hyperlipidemia, breast cancer (s/p right mastectomy), hypothyroidism, who presents with altered mental status, nausea, vomiting, elevated blood sugar.  Clinical Impression  Patient received in bed, she is pleasantly confused. Husband at bedside. Patient is agreeable to PT session. She requires min assist for bed mobility and transfers, ambulates 5 feet along counter with B UE support and then back to bed for a total of 10 feet. She is unsteady with ambulation requiring B UE support at this time to maintain balance. Patient will continue to benefit from skilled PT while here to improve balance, strength and safety for return home.      Follow Up Recommendations Home health PT;Supervision for mobility/OOB    Equipment Recommendations  Rolling walker with 5" wheels;3in1 (PT)    Recommendations for Other Services       Precautions / Restrictions Precautions Precautions: Fall Restrictions Weight Bearing Restrictions: No      Mobility  Bed Mobility Overal bed mobility: Needs Assistance Bed Mobility: Supine to Sit;Sit to Supine     Supine to sit: Min assist Sit to supine: Min assist   General bed mobility comments: Min assist with inital sitting balance, assist to get scooted to edge of bed. Then assist to bring legs back onto bed.    Transfers Overall transfer level: Needs assistance Equipment used: 1 person hand held assist Transfers: Sit to/from Stand Sit to Stand: Min assist            Ambulation/Gait Ambulation/Gait assistance: Min assist Gait Distance (Feet): 10 Feet Assistive device: 1 person hand held assist Gait Pattern/deviations: Step-to pattern;Decreased step length - right;Decreased step length -  left;Shuffle Gait velocity: decreased   General Gait Details: Patient is requiring B UE support at this time. Unable to walk without.  Stairs            Wheelchair Mobility    Modified Rankin (Stroke Patients Only)       Balance Overall balance assessment: Needs assistance Sitting-balance support: Feet supported Sitting balance-Leahy Scale: Fair Sitting balance - Comments: Patient with poor sitting balance initially.   Standing balance support: Bilateral upper extremity supported;During functional activity Standing balance-Leahy Scale: Poor Standing balance comment: Patient with single UE support by writed and other hand on counter.                             Pertinent Vitals/Pain Pain Assessment: No/denies pain    Home Living Family/patient expects to be discharged to:: Private residence Living Arrangements: Spouse/significant other Available Help at Discharge: Family;Available 24 hours/day Type of Home: House Home Access: Stairs to enter Entrance Stairs-Rails: Left Entrance Stairs-Number of Steps: 4 Home Layout: One level Home Equipment: None      Prior Function Level of Independence: Independent         Comments: Prior to admission husband reports she was having difficulty ambulating for a couple of days. Did not use AD prior     Hand Dominance        Extremity/Trunk Assessment   Upper Extremity Assessment Upper Extremity Assessment: Generalized weakness    Lower Extremity Assessment Lower Extremity Assessment: Generalized weakness    Cervical / Trunk Assessment Cervical / Trunk Assessment: Normal  Communication   Communication:  HOH  Cognition Arousal/Alertness: Awake/alert Behavior During Therapy: WFL for tasks assessed/performed Overall Cognitive Status: Impaired/Different from baseline Area of Impairment: Orientation;Safety/judgement;Awareness;Problem solving                 Orientation Level: Disoriented  to;Place;Time;Situation       Safety/Judgement: Decreased awareness of safety;Decreased awareness of deficits Awareness: Intellectual Problem Solving: Requires verbal cues;Requires tactile cues        General Comments      Exercises     Assessment/Plan    PT Assessment Patient needs continued PT services  PT Problem List Decreased strength;Decreased mobility;Decreased activity tolerance;Decreased balance;Decreased knowledge of use of DME;Decreased knowledge of precautions;Decreased cognition       PT Treatment Interventions DME instruction;Therapeutic exercise;Gait training;Balance training;Stair training;Functional mobility training;Therapeutic activities;Patient/family education    PT Goals (Current goals can be found in the Care Plan section)  Acute Rehab PT Goals Patient Stated Goal: hopeful to return home PT Goal Formulation: With patient/family Time For Goal Achievement: 03/23/21 Potential to Achieve Goals: Fair    Frequency Min 2X/week   Barriers to discharge Inaccessible home environment patient has husband at home, but unsure if he is comfortable physically helping her if needed.    Co-evaluation               AM-PAC PT "6 Clicks" Mobility  Outcome Measure Help needed turning from your back to your side while in a flat bed without using bedrails?: A Little Help needed moving from lying on your back to sitting on the side of a flat bed without using bedrails?: A Little Help needed moving to and from a bed to a chair (including a wheelchair)?: A Little Help needed standing up from a chair using your arms (e.g., wheelchair or bedside chair)?: A Little Help needed to walk in hospital room?: A Lot Help needed climbing 3-5 steps with a railing? : A Lot 6 Click Score: 16    End of Session Equipment Utilized During Treatment: Gait belt Activity Tolerance: Patient tolerated treatment well Patient left: in bed;with call bell/phone within reach;with bed alarm  set;with family/visitor present Nurse Communication: Mobility status PT Visit Diagnosis: Unsteadiness on feet (R26.81);Other abnormalities of gait and mobility (R26.89);Muscle weakness (generalized) (M62.81);Difficulty in walking, not elsewhere classified (R26.2)    Time: 2229-7989 PT Time Calculation (min) (ACUTE ONLY): 17 min   Charges:   PT Evaluation $PT Eval Moderate Complexity: 1 Mod PT Treatments $Gait Training: 8-22 mins        [employer], PT, [facility] 03/09/21,3:04 PM

## 2021-03-09 NOTE — Consult Note (Signed)
ANTICOAGULATION CONSULT NOTE - Initial Consult  Pharmacy Consult for heparin infusion Indication: chest pain/ACS  Allergies  Allergen Reactions  . Lisinopril Rash and Other (See Comments)    Hyponatremia  Hyponatremia  Hyponatremia   . Sulfa Antibiotics Other (See Comments)    possible possible Patient doesn't know   . Sulfasalazine     Patient Measurements: Height: 5\' 4"  (162.6 cm) Weight: 54.4 kg (120 lb) IBW/kg (Calculated) : 54.7 Heparin Dosing Weight: 54.4 kg    Labs: Recent Labs    03/08/21 0813 03/08/21 1055 03/08/21 1236 03/08/21 1432 03/08/21 1839 03/08/21 2257 03/09/21 0242  HGB 10.8*  --   --   --   --   --   --   HCT 35.1*  --   --   --   --   --   --   PLT 241  --   --   --   --   --   --   HEPARINUNFRC  --   --   --   --   --   --  0.98*  CREATININE 2.45*   < >  --  2.10* 1.65* 1.56* 1.42*  TROPONINIHS  --   --  21,115* >27,000* >27,000*  --   --    < > = values in this interval not displayed.    Estimated Creatinine Clearance: 26.2 mL/min (A) (by C-G formula based on SCr of 1.42 mg/dL (H)).   Medical History: Past Medical History:  Diagnosis Date  . Breast cancer ([place]) 2011   RT MASTECTOMY  . Cancer ([place]) 2011   BREAST CA  . diabetes insulin dep   . High cholesterol   . Hypertension   . Hypothyroidism   . Osteoporosis   . Thyroid disease     Medications:  Enoxaparin 30 mg SQ given 4/20 1204   Assessment: 80 y.o. female with history of type I diabetes presented with altered mental status. Code sepsis protocol was called cardiology consult called due to troponin drawn was 26,528. Pharmacy has been consulted for heparin initiation and management for ACS.   Goal of Therapy:  Heparin level 0.3-0.7 units/ml Monitor platelets by anticoagulation protocol: Yes   0421 0242 HL 0.98. supratherapeutic   Plan:   Will decrease heparin infusion to 550 units/hr   Recheck HL 8 hours following rate change  CBC daily  Renda Rolls,  PharmD, [facility] 03/09/2021 3:22 AM

## 2021-03-10 DIAGNOSIS — E1069 Type 1 diabetes mellitus with other specified complication: Secondary | ICD-10-CM

## 2021-03-10 LAB — BASIC METABOLIC PANEL
Anion gap: 9 (ref 5–15)
BUN: 25 mg/dL — ABNORMAL HIGH (ref 8–23)
CO2: 21 mmol/L — ABNORMAL LOW (ref 22–32)
Calcium: 8.1 mg/dL — ABNORMAL LOW (ref 8.9–10.3)
Chloride: 107 mmol/L (ref 98–111)
Creatinine, Ser: 0.94 mg/dL (ref 0.44–1.00)
GFR, Estimated: >60 mL/min (ref >60)
Glucose, Bld: 252 mg/dL — ABNORMAL HIGH (ref 70–99)
Potassium: 3.4 mmol/L — ABNORMAL LOW (ref 3.5–5.1)
Sodium: 137 mmol/L (ref 135–145)

## 2021-03-10 LAB — GLUCOSE, CAPILLARY
Glucose-Capillary: 289 mg/dL — ABNORMAL HIGH (ref 70–99)
Glucose-Capillary: 295 mg/dL — ABNORMAL HIGH (ref 70–99)
Glucose-Capillary: 291 mg/dL — ABNORMAL HIGH (ref 70–99)
Glucose-Capillary: 201 mg/dL — ABNORMAL HIGH (ref 70–99)

## 2021-03-10 LAB — CBC
HCT: 31.9 % — ABNORMAL LOW (ref 36.0–46.0)
Hemoglobin: 10.6 g/dL — ABNORMAL LOW (ref 12.0–15.0)
MCH: 32.5 pg (ref 26.0–34.0)
MCHC: 33.2 g/dL (ref 30.0–36.0)
MCV: 97.9 fL (ref 80.0–100.0)
Platelets: 145 10*3/uL — ABNORMAL LOW (ref 150–400)
RBC: 3.26 MIL/uL — ABNORMAL LOW (ref 3.87–5.11)
RDW: 14.1 % (ref 11.5–15.5)
WBC: 12.2 10*3/uL — ABNORMAL HIGH (ref 4.0–10.5)
nRBC: 0 % (ref 0.0–0.2)

## 2021-03-10 MED ORDER — POTASSIUM CHLORIDE CRYS ER 20 MEQ PO TBCR
20.0000 meq | EXTENDED_RELEASE_TABLET | Freq: Once | ORAL | Status: AC
  Administered 2021-03-10: 20 meq via ORAL
  Filled 2021-03-10: qty 1

## 2021-03-10 MED ORDER — SODIUM CHLORIDE 0.9 % IV SOLN
2.0000 g | Freq: Two times a day (BID) | INTRAVENOUS | Status: DC
  Administered 2021-03-10: 2 g via INTRAVENOUS
  Filled 2021-03-10 (×2): qty 2

## 2021-03-10 MED ORDER — ENOXAPARIN SODIUM 40 MG/0.4ML ~~LOC~~ SOLN
40.0000 mg | SUBCUTANEOUS | Status: DC
  Administered 2021-03-10 – 2021-03-13 (×4): 40 mg via SUBCUTANEOUS
  Filled 2021-03-10 (×4): qty 0.4

## 2021-03-10 NOTE — Plan of Care (Signed)
Continuing with plan of care. 

## 2021-03-10 NOTE — Progress Notes (Addendum)
CSW left a VM for patient's husband requesting a return call to discuss discharge planning.  1:05- Attempted call to patient's husband again, no answer. Went by room, no family at bedside. Will follow up tomorrow.  Oleh Genin, [place]

## 2021-03-10 NOTE — Progress Notes (Signed)
PHARMACIST - PHYSICIAN COMMUNICATION  CONCERNING:  Enoxaparin (Lovenox) for DVT Prophylaxis    RECOMMENDATION: Patient was prescribed enoxaparin 30mg  q24 hours for VTE prophylaxis.   Filed Weights   03/08/21 0805  Weight: 54.4 kg (120 lb)    Body mass index is 20.6 kg/m.  Estimated Creatinine Clearance: 39.6 mL/min (by C-G formula based on SCr of 0.94 mg/dL).  Patient is candidate for enoxaparin 40mg  every 24 hours based on CrCl >69ml/min and Weight >45kg  DESCRIPTION: Pharmacy has adjusted enoxaparin dose per [facility] policy.  Patient is now receiving enoxaparin 40 mg every 24 hours    Benita Gutter 03/10/2021 8:07 AM

## 2021-03-10 NOTE — Progress Notes (Signed)
PROGRESS NOTE    [name] [name]  [id number] DOB: 1941-07-10 DOA: 03/08/2021 PCP: Tracie Harrier, MD    Assessment & Plan:   Principal Problem:   DKA (diabetic ketoacidosis) ([place]) Active Problems:   Breast cancer ([place])   Hyperlipemia   Hypothyroidism   Type 1 diabetes mellitus without complication ([place])   AKI (acute kidney injury) ([place])   HTN (hypertension)   Acute metabolic encephalopathy   SIRS (systemic inflammatory response syndrome) ([facility])   Hyperkalemia   Nausea & vomiting   NSVT (nonsustained ventricular tachycardia) ([facility])   NSTEMI (non-ST elevated myocardial infarction) ([place])  SIRS: meets criteria w/ leukocytosis, tachycardic, tachypnea, & elevated lactic acid & no source of infection identified.  UA is neg. CXR is neg. Urinalysis negative. Procalcitonin 0.58. Blood cxs NGTD. Urine cx shows no growth  NSTEMI: d/c IV heparin as per cardio. Not currently a candidate for cardiac cath as per cardio. Continue on tele. Cardio is following and recs apprec  Hyperkalemia: resolved  Hypokalemia: KCl repleated. Will continue to monitor   Thrombocytopenia: etiology unclear. Will continue to monitor   Acute metabolic encephalopathy: likely secondary to DKA vs possible infection. Continue w/ neuro checks. CT head shows no acute intracranial findings   Leukocytosis: likely reactive. D/c abxs and will monitor   Macrocytic anemia: H&H are stable. B12, folate will be checked tomorrow   History of breast cancer: s/p of right mastectomy. Continue on home dose of tamoxifen   HLD: continue on statin    Hypothyroidism: continue on home dose of levothyroxine   DM1: poorly controlled w/ HbA1c 8.1. D/c insulin drip as anion gap has closed. Continue on lantus, SSI w/ accuchecks   AKI: resolved    HTN: continue to hold ACE-I for low normal BP   DVT prophylaxis: lovenox  Code Status: full  Family Communication:  Disposition Plan: d/c home w/ home health   Level of  care: Med-Surg   Status is: Inpatient  Remains inpatient appropriate because:Ongoing diagnostic testing needed not appropriate for outpatient work up, Unsafe d/c plan, IV treatments appropriate due to intensity of illness or inability to take PO and Inpatient level of care appropriate due to severity of illness   Dispo: The patient is from: Home              Anticipated d/c is to: home w/ [facility]               Patient currently is not medically stable to d/c.   Difficult to place patient: unclear   Consultants:   Cardio    Procedures:   Antimicrobials:    Subjective: Pt c/o malaise   Objective: Vitals:   03/09/21 1915 03/09/21 2314 03/10/21 0503 03/10/21 1147  BP: (!) 111/59 112/62 135/62 122/64  Pulse: 81 75 75 77  Resp: 16 16 16 16   Temp: 98.3 F (36.8 C) 98 F (36.7 C) 98.4 F (36.9 C) 98.4 F (36.9 C)  TempSrc: Oral Oral Oral Oral  SpO2: 97% 97% 98% 97%  Weight:      Height:        Intake/Output Summary (Last 24 hours) at 03/10/2021 1323 Last data filed at 03/10/2021 0535 Gross per 24 hour  Intake 0 ml  Output 200 ml  Net -200 ml   Filed Weights   03/08/21 0805  Weight: 54.4 kg    Examination:  General exam: appears comfortable   Respiratory system: clear breath sounds b/l  Cardiovascular system: S1/S2+. No clicks or rubs  Gastrointestinal system: Abd is soft, NT,ND & hypoactive bowel sounds  Central nervous system: Alert and oriented to person, place, & month only. Moves all extremities  Psychiatry: Judgement and insight appear abnormal. Flat mood and affect    Data Reviewed: I have personally reviewed following labs and imaging studies  CBC: Recent Labs  Lab 03/08/21 0813 03/09/21 0242 03/10/21 0519  WBC 22.3* 20.8* 12.2*  NEUTROABS 17.1*  --   --   HGB 10.8* 10.5* 10.6*  HCT 35.1* 30.4* 31.9*  MCV 108.7* 95.9 97.9  PLT 241 192 662*   Basic Metabolic Panel: Recent Labs  Lab 03/08/21 1432 03/08/21 1839 03/08/21 2257 03/09/21 0242  03/10/21 0519  NA 135 135 136 136 137  K 4.2 3.7 3.7 3.5 3.4*  CL 102 105 106 107 107  CO2 11* 18* 20* 22 21*  GLUCOSE 561* 343* 198* 156* 252*  BUN 36* 32* 33* 33* 25*  CREATININE 2.10* 1.65* 1.56* 1.42* 0.94  CALCIUM 8.0* 8.1* 8.3* 8.1* 8.1*  MG 2.0  --   --   --   --    GFR: Estimated Creatinine Clearance: 39.6 mL/min (by C-G formula based on SCr of 0.94 mg/dL). Liver Function Tests: Recent Labs  Lab 03/08/21 0813  AST 36  ALT 17  ALKPHOS 40  BILITOT 2.1*  PROT 6.3*  ALBUMIN 4.0   Recent Labs  Lab 03/08/21 1055  LIPASE 74*   No results for input(s): AMMONIA in the last 168 hours. Coagulation Profile: No results for input(s): INR, PROTIME in the last 168 hours. Cardiac Enzymes: No results for input(s): CKTOTAL, CKMB, CKMBINDEX, TROPONINI in the last 168 hours. BNP (last 3 results) No results for input(s): PROBNP in the last 8760 hours. HbA1C: Recent Labs    03/08/21 1055 03/09/21 0242  HGBA1C 8.9* 9.0*   CBG: Recent Labs  Lab 03/09/21 1126 03/09/21 1551 03/09/21 2113 03/10/21 0739 03/10/21 1145  GLUCAP 253* 147* 119* 289* 295*   Lipid Profile: Recent Labs    03/09/21 0242  CHOL 137  HDL 76  LDLCALC 48  TRIG 64  CHOLHDL 1.8   Thyroid Function Tests: No results for input(s): TSH, T4TOTAL, FREET4, T3FREE, THYROIDAB in the last 72 hours. Anemia Panel: No results for input(s): VITAMINB12, FOLATE, FERRITIN, TIBC, IRON, RETICCTPCT in the last 72 hours. Sepsis Labs: Recent Labs  Lab 03/08/21 1055 03/08/21 1205 03/08/21 1432 03/08/21 2003 03/08/21 2257  PROCALCITON 5.86  --   --   --   --   LATICACIDVEN  --  7.1* 6.4* 3.2* 2.4*    Recent Results (from the past 240 hour(s))  Blood Culture (routine x 2)     Status: None (Preliminary result)   Collection Time: 03/08/21  8:13 AM   Specimen: BLOOD  Result Value Ref Range Status   Specimen Description BLOOD BLOOD RIGHT FOREARM  Final   Special Requests   Final    BOTTLES DRAWN AEROBIC AND  ANAEROBIC Blood Culture adequate volume   Culture   Final    NO GROWTH 2 DAYS Performed at [facility], [address], [place], [place] [address]    Report Status PENDING  Incomplete  Blood Culture (routine x 2)     Status: None (Preliminary result)   Collection Time: 03/08/21  8:13 AM   Specimen: BLOOD  Result Value Ref Range Status   Specimen Description BLOOD BLOOD RIGHT HAND  Final   Special Requests   Final    BOTTLES DRAWN AEROBIC AND ANAEROBIC Blood  Culture adequate volume   Culture   Final    NO GROWTH 2 DAYS Performed at [facility], [address] [place], [place] [address]    Report Status PENDING  Incomplete  Resp Panel by RT-PCR (Flu A&B, Covid) Nasopharyngeal Swab     Status: None   Collection Time: 03/08/21  8:13 AM   Specimen: Nasopharyngeal Swab; Nasopharyngeal(NP) swabs in vial transport medium  Result Value Ref Range Status   SARS Coronavirus 2 by RT PCR NEGATIVE NEGATIVE Final    Comment: (NOTE) SARS-CoV-2 target nucleic acids are NOT DETECTED.  The SARS-CoV-2 RNA is generally detectable in upper respiratory specimens during the acute phase of infection. The lowest concentration of SARS-CoV-2 viral copies this assay can detect is 138 copies/mL. A negative result does not preclude SARS-Cov-2 infection and should not be used as the sole basis for treatment or other patient management decisions. A negative result may occur with  improper specimen collection/handling, submission of specimen other than nasopharyngeal swab, presence of viral mutation(s) within the areas targeted by this assay, and inadequate number of viral copies(<138 copies/mL). A negative result must be combined with clinical observations, patient history, and epidemiological information. The expected result is Negative.  Fact Sheet for Patients:  EntrepreneurPulse.com.au  Fact Sheet for Healthcare Providers:   IncredibleEmployment.be  This test is no t yet approved or cleared by the [place] FDA and  has been authorized for detection and/or diagnosis of SARS-CoV-2 by FDA under an Emergency Use Authorization (EUA). This EUA will remain  in effect (meaning this test can be used) for the duration of the COVID-19 declaration under Section 564(b)(1) of the Act, 21 U.S.C.section 360bbb-3(b)(1), unless the authorization is terminated  or revoked sooner.       Influenza A by PCR NEGATIVE NEGATIVE Final   Influenza B by PCR NEGATIVE NEGATIVE Final    Comment: (NOTE) The Xpert Xpress SARS-CoV-2/FLU/RSV plus assay is intended as an aid in the diagnosis of influenza from Nasopharyngeal swab specimens and should not be used as a sole basis for treatment. Nasal washings and aspirates are unacceptable for Xpert Xpress SARS-CoV-2/FLU/RSV testing.  Fact Sheet for Patients: EntrepreneurPulse.com.au  Fact Sheet for Healthcare Providers: IncredibleEmployment.be  This test is not yet approved or cleared by the [place] FDA and has been authorized for detection and/or diagnosis of SARS-CoV-2 by FDA under an Emergency Use Authorization (EUA). This EUA will remain in effect (meaning this test can be used) for the duration of the COVID-19 declaration under Section 564(b)(1) of the Act, 21 U.S.C. section 360bbb-3(b)(1), unless the authorization is terminated or revoked.  Performed at [facility], [address], [place], [place] [address]   Urine culture     Status: None   Collection Time: 03/08/21 11:00 AM   Specimen: In/Out Cath Urine  Result Value Ref Range Status   Specimen Description   Final    IN/OUT CATH URINE Performed at [facility], [address] [place], [place] [address]    Special Requests   Final    NONE Performed at [facility], [address] [place], [place] [address]    Culture    Final    NO GROWTH Performed at [facility], [place] [address] [place],  [address]    Report Status 03/09/2021 FINAL  Final  MRSA PCR Screening     Status: None   Collection Time: 03/08/21 12:05 PM   Specimen: Nasopharyngeal  Result Value Ref Range Status  MRSA by PCR NEGATIVE NEGATIVE Final    Comment:        The GeneXpert MRSA Assay (FDA approved for NASAL specimens only), is one component of a comprehensive MRSA colonization surveillance program. It is not intended to diagnose MRSA infection nor to guide or monitor treatment for MRSA infections. Performed at [facility], [address] [place], [place] [address]          Radiology Studies: CT HEAD WO CONTRAST  Result Date: 03/08/2021 CLINICAL DATA:  Mental status changes.  Diabetes and hypertension. EXAM: CT HEAD WITHOUT CONTRAST TECHNIQUE: Contiguous axial images were obtained from the base of the skull through the vertex without intravenous contrast. COMPARISON:  MRI 11/14/2017.  CT 06/18/2016. FINDINGS: Brain: Age related atrophy. Chronic small-vessel ischemic changes of the hemispheric white matter. No sign of acute infarction, mass lesion, hemorrhage, hydrocephalus or extra-axial collection. Vascular: There is atherosclerotic calcification of the major vessels at the base of the brain. Skull: Negative Sinuses/Orbits: Clear/normal Other: None IMPRESSION: No acute finding by CT. Age related atrophy and chronic small-vessel ischemic changes of the white matter. Electronically Signed   By: Nelson Chimes M.D.   On: 03/08/2021 15:49   US RENAL  Result Date: 03/08/2021 CLINICAL DATA:  Acute kidney injury EXAM: RENAL / URINARY TRACT ULTRASOUND COMPLETE COMPARISON:  None. FINDINGS: Right Kidney: Renal measurements: 9.9 x 4.5 x 3.3 cm = volume: 76.1 mL. Echogenicity within normal limits. No mass or hydronephrosis visualized. Left Kidney: Renal measurements: 9.4 x 4.2 x 4.2 cm = volume: 85.9 mL. Echogenicity  within normal limits. No mass or hydronephrosis visualized. Bladder: Appears normal for degree of bladder distention. Other: None. IMPRESSION: Negative renal ultrasound Electronically Signed   By: Donavan Foil M.D.   On: 03/08/2021 18:26        Scheduled Meds: . aspirin EC  81 mg Oral Daily  . atorvastatin  80 mg Oral Daily  . calcium-vitamin D  1 tablet Oral Daily  . Chlorhexidine Gluconate Cloth  6 each Topical Daily  . enoxaparin (LOVENOX) injection  40 mg Subcutaneous Q24H  . insulin aspart  0-15 Units Subcutaneous TID AC & HS  . insulin glargine  10 Units Subcutaneous QHS  . levothyroxine  25 mcg Oral Q0600  . tamoxifen  20 mg Oral Daily   Continuous Infusions:    LOS: 2 days    Time spent: 32 mins     Wyvonnia Dusky, MD [facility] Pager [phone]  If 7PM-7AM, please contact night-coverage 03/10/2021, 1:23 PM

## 2021-03-10 NOTE — Progress Notes (Signed)
[facility] Cardiology  Patient Description: [name] [name] is a 80 year old female with PMH significant for hypertension, hyperlipidemia, DM type I, h/o DKA, breast cancer s/p right mastectomy and hypothyroidism who was admitted due to altered mental status and a subsequent diagnosis of DKA.  Cardiology was consulted due to elevated troponin.  SUBJECTIVE: The patient states to be doing well on today and she denies having any chest pain, dyspnea, peripheral edema, palpitations or dizziness at this time. She states that she is feeling well and repeatedly expresses gratitude for her husband being at the beside with her. The husband expresses concerns that the patient's speech is "slower" than usual.   OBJECTIVE: The patient appears fairly well on today, she is AOx3, her vss and she is in no apparent distress. The patient's bedside telemetry monitor reveals sinus rhythm with occasional PVCs. The patient does not appear to have any unilateral weakness, slurred speech, facial drooping or any other strokelike symptoms at this time.   Vitals:   03/09/21 1557 03/09/21 1915 03/09/21 2314 03/10/21 0503  BP: (!) 109/57 (!) 111/59 112/62 135/62  Pulse: 73 81 75 75  Resp: 18 16 16 16   Temp: 98.1 F (36.7 C) 98.3 F (36.8 C) 98 F (36.7 C) 98.4 F (36.9 C)  TempSrc:  Oral Oral Oral  SpO2: 98% 97% 97% 98%  Weight:      Height:         Intake/Output Summary (Last 24 hours) at 03/10/2021 1102 Last data filed at 03/10/2021 0535 Gross per 24 hour  Intake 989.51 ml  Output 200 ml  Net 789.51 ml      PHYSICAL EXAM  General: Well developed, well nourished, in no acute distress HEENT:  Normocephalic and atraumatic, PERRL Neck:  No JVD.  Lungs: Clear bilaterally to auscultation, normal effort of breathing, chest expansion symmetrical, negative for wheezes, rales or rhonchi. Heart: HRRR . Normal S1 and S2 without gallops or murmurs.  Abdomen: Bowel sounds are positive, abdomen soft and non-tender  Msk:  Normal  strength and tone for age. Extremities:  No clubbing, cyanosis or edema. Arthritic deformities to phalanges bilaterally.  Neuro: Alert and oriented X 3. Psych:  Good affect, responds appropriately   LABS: Basic Metabolic Panel: Recent Labs    03/08/21 1432 03/08/21 1839 03/09/21 0242 03/10/21 0519  NA 135   < > 136 137  K 4.2   < > 3.5 3.4*  CL 102   < > 107 107  CO2 11*   < > 22 21*  GLUCOSE 561*   < > 156* 252*  BUN 36*   < > 33* 25*  CREATININE 2.10*   < > 1.42* 0.94  CALCIUM 8.0*   < > 8.1* 8.1*  MG 2.0  --   --   --    < > = values in this interval not displayed.   Liver Function Tests: Recent Labs    03/08/21 0813  AST 36  ALT 17  ALKPHOS 40  BILITOT 2.1*  PROT 6.3*  ALBUMIN 4.0   Recent Labs    03/08/21 1055  LIPASE 74*   CBC: Recent Labs    03/08/21 0813 03/09/21 0242 03/10/21 0519  WBC 22.3* 20.8* 12.2*  NEUTROABS 17.1*  --   --   HGB 10.8* 10.5* 10.6*  HCT 35.1* 30.4* 31.9*  MCV 108.7* 95.9 97.9  PLT 241 192 145*   Cardiac Enzymes: No results for input(s): CKTOTAL, CKMB, CKMBINDEX, TROPONINI in the last 72 hours. BNP: Invalid input(s):  POCBNP D-Dimer: No results for input(s): DDIMER in the last 72 hours. Hemoglobin A1C: Recent Labs    03/09/21 0242  HGBA1C 9.0*   Fasting Lipid Panel: Recent Labs    03/09/21 0242  CHOL 137  HDL 76  LDLCALC 48  TRIG 64  CHOLHDL 1.8   Thyroid Function Tests: No results for input(s): TSH, T4TOTAL, T3FREE, THYROIDAB in the last 72 hours.  Invalid input(s): FREET3 Anemia Panel: No results for input(s): VITAMINB12, FOLATE, FERRITIN, TIBC, IRON, RETICCTPCT in the last 72 hours.  CT HEAD WO CONTRAST  Result Date: 03/08/2021 CLINICAL DATA:  Mental status changes.  Diabetes and hypertension. EXAM: CT HEAD WITHOUT CONTRAST TECHNIQUE: Contiguous axial images were obtained from the base of the skull through the vertex without intravenous contrast. COMPARISON:  MRI 11/14/2017.  CT 06/18/2016. FINDINGS:  Brain: Age related atrophy. Chronic small-vessel ischemic changes of the hemispheric Vincenza Dail matter. No sign of acute infarction, mass lesion, hemorrhage, hydrocephalus or extra-axial collection. Vascular: There is atherosclerotic calcification of the major vessels at the base of the brain. Skull: Negative Sinuses/Orbits: Clear/normal Other: None IMPRESSION: No acute finding by CT. Age related atrophy and chronic small-vessel ischemic changes of the Sunjai Levandoski matter. Electronically Signed   By: Nelson Chimes M.D.   On: 03/08/2021 15:49   US RENAL  Result Date: 03/08/2021 CLINICAL DATA:  Acute kidney injury EXAM: RENAL / URINARY TRACT ULTRASOUND COMPLETE COMPARISON:  None. FINDINGS: Right Kidney: Renal measurements: 9.9 x 4.5 x 3.3 cm = volume: 76.1 mL. Echogenicity within normal limits. No mass or hydronephrosis visualized. Left Kidney: Renal measurements: 9.4 x 4.2 x 4.2 cm = volume: 85.9 mL. Echogenicity within normal limits. No mass or hydronephrosis visualized. Bladder: Appears normal for degree of bladder distention. Other: None. IMPRESSION: Negative renal ultrasound Electronically Signed   By: Donavan Foil M.D.   On: 03/08/2021 18:26     Echo: pending   TELEMETRY: SR with occasional PVC's   ASSESSMENT AND PLAN:  Principal Problem:   DKA (diabetic ketoacidosis) ([place]) Active Problems:   Breast cancer ([place])   Hyperlipemia   Hypothyroidism   Type 1 diabetes mellitus without complication ([place])   AKI (acute kidney injury) ([place])   HTN (hypertension)   Acute metabolic encephalopathy   SIRS (systemic inflammatory response syndrome) ([facility])   Hyperkalemia   Nausea & vomiting   NSVT (nonsustained ventricular tachycardia) ([facility])   NSTEMI (non-ST elevated myocardial infarction) ([place])    1. Elevated troponin at peak of >27,000, likely due to demand ischemia in the presence of AKI, DKA and sepsis, the patient is not symptomatic and does not have any chest pain or ECG changes consistent with  ischemia  -Patient is not a candidate for urgent cardiac catheterization. However, this can be considered as an outpatient once the patient is hemodynamically stable as this current elevated troponin does not appears to be related to ACS.  -Recommending correcting underlying electrolyte imbalances.   -Echocardiogram results are pending.  -Agree with aspirin therapy at this time.    2. DKA in the presence of DM type I, progressively improving, blood glucose on today was 252  -Agree with current management plan.  3. Sepsis, fairly stable, patient has been transferred from the ICU to the stepdown unit  -Agree with empiric antibiotic therapy.  5. HLD  -Agree with statin therapy.  -Recommend a heart healthy diet.  6. Hypotension in the presence of sepsis, fairly stable, patient is normotensive at this time  -Agree with holding all antihypertensive medications at this  time.  -Recommend oral hydration.     [place], [id number]-AG  03/10/2021 11:02 AM

## 2021-03-10 NOTE — Care Management Important Message (Signed)
Important Message  Patient Details  Name: ROBIE OATS MRN: [id number] Date of Birth: 1940-12-01   Medicare Important Message Given:  Yes     Dannette Barbara 03/10/2021, 11:51 AM

## 2021-03-10 NOTE — Progress Notes (Signed)
Physical Therapy Treatment Patient Details Name: [name] [name] MRN: [id number] DOB: 1941/11/05 Today's Date: 03/10/2021    History of Present Illness [name] [name] is a 80 y.o. female with medical history significant of type 1 diabetes, DKA, hypertension, hyperlipidemia, breast cancer (s/p right mastectomy), hypothyroidism, who presents with altered mental status, nausea, vomiting, elevated blood sugar.    PT Comments    Patient received in bed, husband at bedside. Patient agreeable to PT session with husband's encouragement. She requires increased time, but is mod independent with supine><sit. Min guard for sit to stand with cues for safe hand placement. Patient able to ambulate 120 feet with min guard and rolling walker. She requires cues for safety with AD. Patient will continue to benefit from skilled PT while here to improve safety with functional mobility.         Follow Up Recommendations  Home health PT;Supervision for mobility/OOB     Equipment Recommendations  Rolling walker with 5" wheels;3in1 (PT)    Recommendations for Other Services       Precautions / Restrictions Precautions Precautions: Fall Restrictions Weight Bearing Restrictions: No    Mobility  Bed Mobility Overal bed mobility: Needs Assistance Bed Mobility: Supine to Sit;Sit to Supine     Supine to sit: Min guard Sit to supine: Min guard   General bed mobility comments: min guard with multimodal cues for mobility.    Transfers Overall transfer level: Needs assistance Equipment used: Rolling walker (2 wheeled) Transfers: Sit to/from Stand Sit to Stand: Min guard            Ambulation/Gait Ambulation/Gait assistance: Min guard Gait Distance (Feet): 120 Feet Assistive device: Rolling walker (2 wheeled) Gait Pattern/deviations: Step-to pattern;Decreased step length - right;Decreased step length - left;Shuffle;Trunk flexed Gait velocity: decreased   General Gait Details: Patient reliant on RW  and min guard. Cues to get closer to RW and assist with turning RW.   Stairs             Wheelchair Mobility    Modified Rankin (Stroke Patients Only)       Balance Overall balance assessment: Needs assistance Sitting-balance support: Feet supported Sitting balance-Leahy Scale: Good     Standing balance support: Bilateral upper extremity supported;During functional activity Standing balance-Leahy Scale: Fair Standing balance comment: Reliant on B UE support, min guard for safety                            Cognition Arousal/Alertness: Awake/alert Behavior During Therapy: WFL for tasks assessed/performed Overall Cognitive Status: Impaired/Different from baseline Area of Impairment: Problem solving;Safety/judgement;Awareness;Memory                 Orientation Level: Disoriented to;Situation;Place;Time   Memory: Decreased short-term memory   Safety/Judgement: Decreased awareness of safety;Decreased awareness of deficits Awareness: Intellectual Problem Solving: Requires verbal cues;Requires tactile cues        Exercises      General Comments        Pertinent Vitals/Pain Pain Assessment: No/denies pain    Home Living                      Prior Function            PT Goals (current goals can now be found in the care plan section) Acute Rehab PT Goals Patient Stated Goal: hopeful to return home PT Goal Formulation: With patient/family Time For Goal Achievement: 03/23/21 Potential to  Achieve Goals: Good Progress towards PT goals: Progressing toward goals    Frequency    Min 2X/week      PT Plan Current plan remains appropriate    Co-evaluation              AM-PAC PT "6 Clicks" Mobility   Outcome Measure  Help needed turning from your back to your side while in a flat bed without using bedrails?: A Little Help needed moving from lying on your back to sitting on the side of a flat bed without using bedrails?: A  Little Help needed moving to and from a bed to a chair (including a wheelchair)?: A Little Help needed standing up from a chair using your arms (e.g., wheelchair or bedside chair)?: A Little Help needed to walk in hospital room?: A Little Help needed climbing 3-5 steps with a railing? : A Lot 6 Click Score: 17    End of Session Equipment Utilized During Treatment: Gait belt Activity Tolerance: Patient tolerated treatment well Patient left: in bed;with call bell/phone within reach;with bed alarm set;with family/visitor present Nurse Communication: Mobility status PT Visit Diagnosis: Unsteadiness on feet (R26.81);Other abnormalities of gait and mobility (R26.89);Muscle weakness (generalized) (M62.81);Difficulty in walking, not elsewhere classified (R26.2)     Time: 3875-6433 PT Time Calculation (min) (ACUTE ONLY): 26 min  Charges:  $Gait Training: 23-37 mins                     [employer], PT, GCS 03/10/21,11:23 AM

## 2021-03-10 NOTE — Consult Note (Signed)
Pharmacy Antibiotic Note  [name] [name] is a 80 y.o. female with medical history including T1DM, osteoporosis, breast cancer, hypothyroidism, HTN admitted on 03/08/2021 with acute metabolic encephalopathy, hyperglycemic emergency, possible sepsis. Pt presented with N/V and elevated blood glucose. Pharmacy has been consulted for cefepime dosing. Source of sepsis remains elusive. Vancomycin has been discontinued.  Today, 03/10/21  --Vancomycin discontinued yesterday --Day # 3 cefepime --Leukocytosis resolving, afebrile --Cultures remain negative  Plan:  Adjust cefepime to 2 g IV q12h   Monitor clinical picture, renal function, vanc levels as needed  F/U C&S, abx deescalation / LOT   Height: 5\' 4"  (162.6 cm) Weight: 54.4 kg (120 lb) IBW/kg (Calculated) : 54.7  Temp (24hrs), Avg:98.4 F (36.9 C), Min:98 F (36.7 C), Max:99.1 F (37.3 C)  Recent Labs  Lab 03/08/21 0813 03/08/21 1008 03/08/21 1010 03/08/21 1055 03/08/21 1205 03/08/21 1432 03/08/21 1839 03/08/21 2003 03/08/21 2257 03/09/21 0242 03/10/21 0519  WBC 22.3*  --   --   --   --   --   --   --   --  20.8* 12.2*  CREATININE 2.45*  --   --    < >  --  2.10* 1.65*  --  1.56* 1.42* 0.94  LATICACIDVEN  --    < > 8.0*  --  7.1* 6.4*  --  3.2* 2.4*  --   --   VANCORANDOM  --   --   --   --   --   --   --   --   --  12  --    < > = values in this interval not displayed.    Estimated Creatinine Clearance: 39.6 mL/min (by C-G formula based on SCr of 0.94 mg/dL).    Allergies  Allergen Reactions  . Lisinopril Rash and Other (See Comments)    Hyponatremia  Hyponatremia  Hyponatremia   . Sulfa Antibiotics Other (See Comments)    possible possible Patient doesn't know   . Sulfasalazine     Antimicrobials this admission: 4/20 Flagyl 500 mg IV x1 in ED  4/20 Vancomycin >> 4/21 4/20 Cefepime >>   Dose adjustments this admission: 4/22: Increase cefepime from 2 g IV q24h to 2 g IV q12h  Microbiology results: 4/20  BCx: NGTD 4/20 UCx: NG   Thank you for allowing pharmacy to be a part of this patient's care.  Benita Gutter 03/10/2021 8:08 AM

## 2021-03-11 LAB — ECHOCARDIOGRAM COMPLETE
AR max vel: 1.81 cm2
AV Area VTI: 1.72 cm2
AV Area mean vel: 1.57 cm2
AV Mean grad: 4.3 mmHg
AV Peak grad: 6.9 mmHg
Ao pk vel: 1.31 m/s
Area-P 1/2: 4.24 cm2
Height: 64 in
S' Lateral: 1.83 cm
Weight: 1920 oz

## 2021-03-11 LAB — GLUCOSE, CAPILLARY
Glucose-Capillary: 51 mg/dL — ABNORMAL LOW (ref 70–99)
Glucose-Capillary: 70 mg/dL (ref 70–99)
Glucose-Capillary: 93 mg/dL (ref 70–99)
Glucose-Capillary: 154 mg/dL — ABNORMAL HIGH (ref 70–99)
Glucose-Capillary: 269 mg/dL — ABNORMAL HIGH (ref 70–99)
Glucose-Capillary: 144 mg/dL — ABNORMAL HIGH (ref 70–99)
Glucose-Capillary: 59 mg/dL — ABNORMAL LOW (ref 70–99)
Glucose-Capillary: 61 mg/dL — ABNORMAL LOW (ref 70–99)
Glucose-Capillary: 163 mg/dL — ABNORMAL HIGH (ref 70–99)
Glucose-Capillary: 87 mg/dL (ref 70–99)

## 2021-03-11 LAB — CBC
HCT: 34.5 % — ABNORMAL LOW (ref 36.0–46.0)
Hemoglobin: 11.7 g/dL — ABNORMAL LOW (ref 12.0–15.0)
MCH: 32.8 pg (ref 26.0–34.0)
MCHC: 33.9 g/dL (ref 30.0–36.0)
MCV: 96.6 fL (ref 80.0–100.0)
Platelets: 166 10*3/uL (ref 150–400)
RBC: 3.57 MIL/uL — ABNORMAL LOW (ref 3.87–5.11)
RDW: 14 % (ref 11.5–15.5)
WBC: 9.7 10*3/uL (ref 4.0–10.5)
nRBC: 0 % (ref 0.0–0.2)

## 2021-03-11 LAB — FOLATE: Folate: 5 ng/mL — ABNORMAL LOW (ref >5.9)

## 2021-03-11 LAB — BASIC METABOLIC PANEL
Anion gap: 8 (ref 5–15)
BUN: 23 mg/dL (ref 8–23)
CO2: 25 mmol/L (ref 22–32)
Calcium: 8.3 mg/dL — ABNORMAL LOW (ref 8.9–10.3)
Chloride: 106 mmol/L (ref 98–111)
Creatinine, Ser: 0.74 mg/dL (ref 0.44–1.00)
GFR, Estimated: >60 mL/min (ref >60)
Glucose, Bld: 39 mg/dL — CL (ref 70–99)
Potassium: 3.2 mmol/L — ABNORMAL LOW (ref 3.5–5.1)
Sodium: 139 mmol/L (ref 135–145)

## 2021-03-11 LAB — VITAMIN B12: Vitamin B-12: 312 pg/mL (ref 180–914)

## 2021-03-11 MED ORDER — FOLIC ACID 1 MG PO TABS
1.0000 mg | ORAL_TABLET | Freq: Every day | ORAL | Status: DC
  Administered 2021-03-11 – 2021-03-14 (×4): 1 mg via ORAL
  Filled 2021-03-11 (×4): qty 1

## 2021-03-11 MED ORDER — METOPROLOL TARTRATE 25 MG PO TABS
12.5000 mg | ORAL_TABLET | Freq: Two times a day (BID) | ORAL | Status: DC
  Administered 2021-03-11 – 2021-03-14 (×6): 12.5 mg via ORAL
  Filled 2021-03-11 (×6): qty 1

## 2021-03-11 MED ORDER — FOLIC ACID 1 MG PO TABS: 1.0000 mg | ORAL_TABLET | Freq: Every day | ORAL | 0 refills | Status: AC

## 2021-03-11 MED ORDER — ASPIRIN 81 MG PO TBEC: 81.0000 mg | DELAYED_RELEASE_TABLET | Freq: Every day | ORAL | Status: DC

## 2021-03-11 MED ORDER — POTASSIUM CHLORIDE CRYS ER 20 MEQ PO TBCR
40.0000 meq | EXTENDED_RELEASE_TABLET | Freq: Once | ORAL | Status: AC
  Administered 2021-03-11: 40 meq via ORAL
  Filled 2021-03-11: qty 2

## 2021-03-11 MED ORDER — DEXTROSE 50 % IV SOLN: 25.0000 mL | Freq: Once | INTRAVENOUS | Status: DC

## 2021-03-11 MED ORDER — METOPROLOL TARTRATE 25 MG PO TABS: 12.5000 mg | ORAL_TABLET | Freq: Two times a day (BID) | ORAL | 2 refills | Status: DC

## 2021-03-11 NOTE — Plan of Care (Signed)
Continuing with plan of care. 

## 2021-03-11 NOTE — Discharge Summary (Addendum)
Physician Discharge Summary  [name] [name] [id number]:[id number] DOB: 07-30-1941 DOA: 03/08/2021  PCP: Tracie Harrier, MD  Admit date: 03/08/2021 Discharge date: 03/14/21  Admitted From: home  Disposition: SNF  Recommendations for Outpatient Follow-up:  1. Follow up with PCP in 1-2 weeks 2. F/u w/ cardio, Dr. Ubaldo Glassing, in 1-2 weeks   Home Health: yes  Equipment/Devices:  Discharge Condition: stable  CODE STATUS: DNR Diet recommendation: Heart Healthy / Carb Modified  Brief/Interim Summary: HPI was taken from Dr. Blaine Hamper: [name] [name] is a 80 y.o. female with medical history significant of type 1 diabetes, DKA, hypertension, hyperlipidemia, breast cancer (s/p right mastectomy), hypothyroidism, who presents with altered mental status, nausea, vomiting, elevated blood sugar.  Per her husband at bedside, patient's blood sugar has been elevated in the past 2 weeks.  Patient has not been feeling well since last night.  This morning patient became confused.  She normally is alert and oriented x3.  Per report, patient was initially unresponsive, her mental status has gradually improved in the emergency room. When I saw pt in ED, she is still confused, but knows her own name and knows that she is in hospital. She is confused about time.  Per husband, patient has nausea and vomited at least 4 times with nonbilious nonbloody vomiting.  Does not seem to have abdominal pain or diarrhea.  Patient has been feeling hot.  Her body temperature is 98.5 in ED.  Patient has increased urinary frequency, but did not complain of dysuria or burning on urination.  She moves all extremities.  No facial droop.  She seems to have slurred speech.  Patient was initially hypotensive with blood pressure 79/46, which improved to 90/50 after giving 2 L normal saline bolus in ED. Pre RN report, pt had one episode of 19 beats of NSVT in ED.  ED Course: pt was found to have WBC 22, blood sugar 988, bicarbonate <7.0, anion gap not  calculated, beta hydroxybutyric acid> 8.0, UA negative for UTI, but positive for ketone 80, negative COVID PCR, potassium 6.6, AKI with creatinine 2.45, BUN 40 (creatinine 0.8 on 12/12/2020), temperature 98.8, tachycardic with heart rate of 129, RR 33, oxygen saturation 95% on room air.  Chest x-ray negative.  Patient is admitted to stepdown as inpatient   Hospital course from Dr. Jimmye Norman 4/21-4/23/22: Pt presented in DKA secondary to poorly controlled DM. Pt was initially put on IV insulin drip and then was converted to sq insulin once the anion gap closed. Of note, pt presented w/ SIRS and was initially started on IV abxs but no source of infection was found so abxs were d/c. Blood & urine cxs showed no growth. CXR was neg. Also, pt was found to have NSTEMI that was treated medically as pt was not a candidate for cardiac cath as per cardio. Pt will f/u outpatient w/ cardio, Dr. Ubaldo Glassing, in 1-2 weeks as per cardio. PT/OT initially evaluated the pt and recommend HH but on repeat evaluation, PT/OT recommended SNF. For more information, please see previous progress/consult notes.   Discharge Diagnoses:  Principal Problem:   DKA (diabetic ketoacidosis) ([place]) Active Problems:   Breast cancer ([place])   Hyperlipemia   Hypothyroidism   Type 1 diabetes mellitus without complication (HCC)   AKI (acute kidney injury) ([place])   HTN (hypertension)   Acute metabolic encephalopathy   SIRS (systemic inflammatory response syndrome) (HCC)   Hyperkalemia   Nausea & vomiting   NSVT (nonsustained ventricular tachycardia) (HCC)   NSTEMI (non-ST elevated  myocardial infarction) ([place]) SIRS: meets criteria w/ leukocytosis, tachycardic, tachypnea, & elevated lactic acid & no source of infection identified. Sepsis r/o.  UA is neg. CXR is neg. Urinalysis negative. Procalcitonin 0.58. Blood cxs NGTD. Urine cx shows no growth  NSTEMI: d/c IV heparin as per cardio. Not currently a candidate for cardiac cath as per cardio. Continue  on tele. Cardio is following and recs apprec  Hyperkalemia: resolved  Hypokalemia: potassium given   Thrombocytopenia: resolved   Acute metabolic encephalopathy: close to baseline. Likely secondary to DKA vs possible infection. Continue w/ neuro checks. CT head shows no acute intracranial findings   Leukocytosis: resolved  Macrocytic anemia: likely secondary to folic acid deficiency. Started on folic acid supplement   History of breast cancer: s/p of right mastectomy. Continue on home dose of tamoxifen   HLD: continue on statin    Hypothyroidism: continue on home dose of levothyroxine   DM1: poorly controlled w/ HbA1c 8.1. D/c insulin drip as anion gap has closed. Continue on lantus, SSI w/ accuchecks   AKI: resolved    HTN: restart home dose of ACE-I at d/c    Discharge Instructions  Discharge Instructions    Diet - low sodium heart healthy   Complete by: As directed    Diet Carb Modified   Complete by: As directed    Discharge instructions   Complete by: As directed    F/u w/ PCP in 1-2 weeks. F/u w/ cardio, Dr. Ubaldo Glassing, in 1-2 weeks   Increase activity slowly   Complete by: As directed      Allergies as of 03/14/2021      Reactions   Lisinopril Rash, Other (See Comments)   Hyponatremia  Hyponatremia  Hyponatremia    Sulfa Antibiotics Other (See Comments)   possible possible Patient doesn't know    Sulfasalazine       Medication List    TAKE these medications   aspirin 81 MG EC tablet Take 1 tablet (81 mg total) by mouth daily. Swallow whole.   Calcium Carbonate-Vitamin D 500-125 MG-UNIT Tabs Take by mouth.   denosumab 60 MG/ML Soln injection Commonly known as: PROLIA Inject 60 mg into the skin every 6 (six) months.   enalapril 20 MG tablet Commonly known as: VASOTEC TAKE 1 TABLET TWICE A DAY   folic acid 1 MG tablet Commonly known as: FOLVITE Take 1 tablet (1 mg total) by mouth daily.   insulin aspart 100 UNIT/ML injection Commonly  known as: novoLOG Inject 2-5 units up to three times daily as directed   insulin glargine 100 UNIT/ML injection Commonly known as: LANTUS Inject 0.04 mLs (4 Units total) into the skin daily. Start taking on: March 15, 2021 What changed: how much to take   levothyroxine 25 MCG tablet Commonly known as: SYNTHROID Take by mouth.   lovastatin 20 MG tablet Commonly known as: MEVACOR TAKE 1 TABLET DAILY   metoprolol tartrate 25 MG tablet Commonly known as: LOPRESSOR Take 0.5 tablets (12.5 mg total) by mouth 2 (two) times daily.   OneTouch Verio test strip Generic drug: glucose blood Use 3 (three) times daily. ONE TOUCH VERIO TEST STRIPS. E10.9   tamoxifen 20 MG tablet Commonly known as: NOLVADEX TAKE 1 TABLET DAILY            Durable Medical Equipment  (From admission, onward)         Start     Ordered   03/11/21 0931  For home use only DME Walker rolling  Once       Question Answer Comment  Walker: With [place]   Patient needs a walker to treat with the following condition Generalized weakness      03/11/21 0930   03/11/21 0931  For home use only DME 3 n 1  Once        03/11/21 0930          Contact information for follow-up providers    Teodoro Spray, MD Follow up in 2 week(s).   Specialty: Cardiology Why: f/u in 1-2 weeks  Contact information: [place] [address] [phone]        Tracie Harrier, MD Follow up in 1 week(s).   Specialty: Internal Medicine Contact information: [facility] [place] [address] [phone]            Contact information for after-discharge care    [facility] .   Service: Skilled Nursing Contact information: [place] [phone]                 Allergies  Allergen Reactions  .  Lisinopril Rash and Other (See Comments)    Hyponatremia  Hyponatremia  Hyponatremia   . Sulfa Antibiotics Other (See Comments)    possible possible Patient doesn't know   . Sulfasalazine     Consultations:  Cardio  Palliative care    Procedures/Studies: CT HEAD WO CONTRAST  Result Date: 03/08/2021 CLINICAL DATA:  Mental status changes.  Diabetes and hypertension. EXAM: CT HEAD WITHOUT CONTRAST TECHNIQUE: Contiguous axial images were obtained from the base of the skull through the vertex without intravenous contrast. COMPARISON:  MRI 11/14/2017.  CT 06/18/2016. FINDINGS: Brain: Age related atrophy. Chronic small-vessel ischemic changes of the hemispheric white matter. No sign of acute infarction, mass lesion, hemorrhage, hydrocephalus or extra-axial collection. Vascular: There is atherosclerotic calcification of the major vessels at the base of the brain. Skull: Negative Sinuses/Orbits: Clear/normal Other: None IMPRESSION: No acute finding by CT. Age related atrophy and chronic small-vessel ischemic changes of the white matter. Electronically Signed   By: Nelson Chimes M.D.   On: 03/08/2021 15:49   US RENAL  Result Date: 03/08/2021 CLINICAL DATA:  Acute kidney injury EXAM: RENAL / URINARY TRACT ULTRASOUND COMPLETE COMPARISON:  None. FINDINGS: Right Kidney: Renal measurements: 9.9 x 4.5 x 3.3 cm = volume: 76.1 mL. Echogenicity within normal limits. No mass or hydronephrosis visualized. Left Kidney: Renal measurements: 9.4 x 4.2 x 4.2 cm = volume: 85.9 mL. Echogenicity within normal limits. No mass or hydronephrosis visualized. Bladder: Appears normal for degree of bladder distention. Other: None. IMPRESSION: Negative renal ultrasound Electronically Signed   By: Donavan Foil M.D.   On: 03/08/2021 18:26   DG Chest Port 1 View  Result Date: 03/08/2021 CLINICAL DATA:  Questionable sepsis. EXAM: PORTABLE CHEST 1 VIEW COMPARISON:  11/15/2017. FINDINGS: Patient rotated to the right. Mild  prominence of the right upper mediastinum most likely related to patient rotation and prominent great vessels. Heart size normal. No focal infiltrate. No pleural effusion or pneumothorax. Stable deformity right scapula. Old right rib fractures again noted. Surgical clips right chest. IMPRESSION: No acute cardiopulmonary disease. Electronically Signed   By: Marcello Moores  Register   On: 03/08/2021 08:31   ECHOCARDIOGRAM COMPLETE  Result Date: 03/11/2021    ECHOCARDIOGRAM REPORT   Patient Name:   Nolan  D Hennes Date of Exam: 03/09/2021 Medical Rec #:  [id number]    Height:       64.0 in Accession #:    [id number]   Weight:       120.0 lb Date of Birth:  12-Jan-1941    BSA:          1.575 m Patient Age:    80 years     BP:           101/53 mmHg Patient Gender: F            HR:           78 bpm. Exam Location:  [facility] Procedure: 2D Echo, Cardiac Doppler and Color Doppler Indications:     NSTEMI I21.4  History:         Patient has no prior history of Echocardiogram examinations.                  Risk Factors:Hypertension and Diabetes.  Sonographer:     Sherrie Sport RDCS (AE) Referring Phys:  [place] Diagnosing Phys: Yolonda Kida MD  Sonographer Comments: Suboptimal parasternal window. IMPRESSIONS  1. Borderline low EF 50-55%.  2. Inferior hypo.  3. Left ventricular ejection fraction, by estimation, is 50 to 55%. The left ventricle has low normal function. The left ventricle demonstrates regional wall motion abnormalities (see scoring diagram/findings for description). Left ventricular diastolic  parameters were normal.  4. Right ventricular systolic function is normal. The right ventricular size is normal.  5. The mitral valve is normal in structure. Mild to moderate mitral valve regurgitation.  6. The aortic valve is normal in structure. Aortic valve regurgitation is trivial. FINDINGS  Left Ventricle: Inferior hypo. Left ventricular ejection fraction, by estimation, is 50 to 55%. The left ventricle has low normal  function. The left ventricle demonstrates regional wall motion abnormalities. The left ventricular internal cavity size was  normal in size. There is no left ventricular hypertrophy. Left ventricular diastolic parameters were normal. Right Ventricle: The right ventricular size is normal. No increase in right ventricular wall thickness. Right ventricular systolic function is normal. Left Atrium: Left atrial size was normal in size. Right Atrium: Right atrial size was normal in size. Pericardium: There is no evidence of pericardial effusion. Mitral Valve: The mitral valve is normal in structure. Mild to moderate mitral valve regurgitation. Tricuspid Valve: The tricuspid valve is normal in structure. Tricuspid valve regurgitation is trivial. Aortic Valve: The aortic valve is normal in structure. Aortic valve regurgitation is trivial. Aortic valve mean gradient measures 4.3 mmHg. Aortic valve peak gradient measures 6.9 mmHg. Aortic valve area, by VTI measures 1.72 cm. Pulmonic Valve: The pulmonic valve was normal in structure. Pulmonic valve regurgitation is trivial. Aorta: The ascending aorta was not well visualized. IAS/Shunts: No atrial level shunt detected by color flow Doppler. Additional Comments: Borderline low EF 50-55%. Inferior hypo.  LEFT VENTRICLE PLAX 2D LVIDd:         2.53 cm  Diastology LVIDs:         1.83 cm  LV e' medial:    3.81 cm/s LV PW:         0.83 cm  LV E/e' medial:  23.2 LV IVS:        0.78 cm  LV e' lateral:   3.05 cm/s LVOT diam:     2.00 cm  LV E/e' lateral: 29.0 LV SV:         43 LV SV Index:  27 LVOT Area:     3.14 cm  RIGHT VENTRICLE RV Basal diam:  3.08 cm RV S prime:     9.36 cm/s TAPSE (M-mode): 2.9 cm LEFT ATRIUM           Index       RIGHT ATRIUM           Index LA diam:      3.40 cm 2.16 cm/m  RA Area:     12.20 cm LA Vol (A2C): 26.2 ml 16.64 ml/m RA Volume:   26.90 ml  17.08 ml/m LA Vol (A4C): 28.3 ml 17.97 ml/m  AORTIC VALVE                   PULMONIC VALVE AV Area (Vmax):     1.81 cm    PV Vmax:        0.61 m/s AV Area (Vmean):   1.57 cm    PV Peak grad:   1.5 mmHg AV Area (VTI):     1.72 cm    RVOT Peak grad: 2 mmHg AV Vmax:           131.33 cm/s AV Vmean:          96.533 cm/s AV VTI:            0.251 m AV Peak Grad:      6.9 mmHg AV Mean Grad:      4.3 mmHg LVOT Vmax:         75.70 cm/s LVOT Vmean:        48.100 cm/s LVOT VTI:          0.137 m LVOT/AV VTI ratio: 0.55  AORTA Ao Root diam: 2.55 cm MITRAL VALVE               TRICUSPID VALVE MV Area (PHT): 4.24 cm    TR Peak grad:   23.6 mmHg MV Decel Time: 179 msec    TR Vmax:        243.00 cm/s MV E velocity: 88.30 cm/s MV A velocity: 77.60 cm/s  SHUNTS MV E/A ratio:  1.14        Systemic VTI:  0.14 m                            Systemic Diam: 2.00 cm Yolonda Kida MD Electronically signed by Yolonda Kida MD Signature Date/Time: 03/11/2021/12:02:43 PM    Final       Subjective: Pt c/o fatigue    Discharge Exam: Vitals:   03/14/21 0421 03/14/21 0709  BP: 125/64 (!) 136/59  Pulse: 77 75  Resp: 18 16  Temp: 98.2 F (36.8 C) 98.1 F (36.7 C)  SpO2: 97% 98%   Vitals:   03/13/21 2038 03/13/21 2313 03/14/21 0421 03/14/21 0709  BP: (!) 112/49 (!) 119/54 125/64 (!) 136/59  Pulse: 81 82 77 75  Resp: 20 18 18 16   Temp: 98 F (36.7 C) 98.1 F (36.7 C) 98.2 F (36.8 C) 98.1 F (36.7 C)  TempSrc:  Oral Oral Oral  SpO2: 97% 99% 97% 98%  Weight:      Height:        General: Pt is alert, awake, not in acute distress Cardiovascular: S1/S2 +, no rubs, no gallops Respiratory: CTA bilaterally, no wheezing, no rhonchi Abdominal: Soft, NT, ND, bowel sounds + Extremities: no edema, no cyanosis    The results of significant diagnostics from this hospitalization (  including imaging, microbiology, ancillary and laboratory) are listed below for reference.     Microbiology: Recent Results (from the past 240 hour(s))  Blood Culture (routine x 2)     Status: None   Collection Time: 03/08/21  8:13 AM    Specimen: BLOOD  Result Value Ref Range Status   Specimen Description BLOOD BLOOD RIGHT FOREARM  Final   Special Requests   Final    BOTTLES DRAWN AEROBIC AND ANAEROBIC Blood Culture adequate volume   Culture   Final    NO GROWTH 5 DAYS Performed at [facility], [address] [place], [place] [address]    Report Status 03/13/2021 FINAL  Final  Blood Culture (routine x 2)     Status: None   Collection Time: 03/08/21  8:13 AM   Specimen: BLOOD  Result Value Ref Range Status   Specimen Description BLOOD BLOOD RIGHT HAND  Final   Special Requests   Final    BOTTLES DRAWN AEROBIC AND ANAEROBIC Blood Culture adequate volume   Culture   Final    NO GROWTH 5 DAYS Performed at [facility], [place] [place], [place] [address]    Report Status 03/13/2021 FINAL  Final  Resp Panel by RT-PCR (Flu A&B, Covid) Nasopharyngeal Swab     Status: None   Collection Time: 03/08/21  8:13 AM   Specimen: Nasopharyngeal Swab; Nasopharyngeal(NP) swabs in vial transport medium  Result Value Ref Range Status   SARS Coronavirus 2 by RT PCR NEGATIVE NEGATIVE Final    Comment: (NOTE) SARS-CoV-2 target nucleic acids are NOT DETECTED.  The SARS-CoV-2 RNA is generally detectable in upper respiratory specimens during the acute phase of infection. The lowest concentration of SARS-CoV-2 viral copies this assay can detect is 138 copies/mL. A negative result does not preclude SARS-Cov-2 infection and should not be used as the sole basis for treatment or other patient management decisions. A negative result may occur with  improper specimen collection/handling, submission of specimen other than nasopharyngeal swab, presence of viral mutation(s) within the areas targeted by this assay, and inadequate number of viral copies(<138 copies/mL). A negative result must be combined with clinical observations, patient history, and epidemiological information. The expected result is  Negative.  Fact Sheet for Patients:  EntrepreneurPulse.com.au  Fact Sheet for Healthcare Providers:  IncredibleEmployment.be  This test is no t yet approved or cleared by the [place] FDA and  has been authorized for detection and/or diagnosis of SARS-CoV-2 by FDA under an Emergency Use Authorization (EUA). This EUA will remain  in effect (meaning this test can be used) for the duration of the COVID-19 declaration under Section 564(b)(1) of the Act, 21 U.S.C.section 360bbb-3(b)(1), unless the authorization is terminated  or revoked sooner.       Influenza A by PCR NEGATIVE NEGATIVE Final   Influenza B by PCR NEGATIVE NEGATIVE Final    Comment: (NOTE) The Xpert Xpress SARS-CoV-2/FLU/RSV plus assay is intended as an aid in the diagnosis of influenza from Nasopharyngeal swab specimens and should not be used as a sole basis for treatment. Nasal washings and aspirates are unacceptable for Xpert Xpress SARS-CoV-2/FLU/RSV testing.  Fact Sheet for Patients: EntrepreneurPulse.com.au  Fact Sheet for Healthcare Providers: IncredibleEmployment.be  This test is not yet approved or cleared by the [place] FDA and has been authorized for detection and/or diagnosis of SARS-CoV-2 by FDA under an Emergency Use Authorization (EUA). This EUA will remain in effect (meaning this test can be used) for the duration  of the COVID-19 declaration under Section 564(b)(1) of the Act, 21 U.S.C. section 360bbb-3(b)(1), unless the authorization is terminated or revoked.  Performed at [facility], [address] [place], [place] [address]   Urine culture     Status: None   Collection Time: 03/08/21 11:00 AM   Specimen: In/Out Cath Urine  Result Value Ref Range Status   Specimen Description   Final    IN/OUT CATH URINE Performed at [facility], [address] [place], [place] [address]    Special  Requests   Final    NONE Performed at [facility], [address], [place], [place] [address]    Culture   Final    NO GROWTH Performed at [facility], [place] [address], [place], [place] [address]    Report Status 03/09/2021 FINAL  Final  MRSA PCR Screening     Status: None   Collection Time: 03/08/21 12:05 PM   Specimen: Nasopharyngeal  Result Value Ref Range Status   MRSA by PCR NEGATIVE NEGATIVE Final    Comment:        The GeneXpert MRSA Assay (FDA approved for NASAL specimens only), is one component of a comprehensive MRSA colonization surveillance program. It is not intended to diagnose MRSA infection nor to guide or monitor treatment for MRSA infections. Performed at [facility], [place], [place] [address]   SARS CORONAVIRUS 2 (TAT 6-24 HRS) Nasopharyngeal Nasopharyngeal Swab     Status: None   Collection Time: 03/13/21  3:50 PM   Specimen: Nasopharyngeal Swab  Result Value Ref Range Status   SARS Coronavirus 2 NEGATIVE NEGATIVE Final    Comment: (NOTE) SARS-CoV-2 target nucleic acids are NOT DETECTED.  The SARS-CoV-2 RNA is generally detectable in upper and lower respiratory specimens during the acute phase of infection. Negative results do not preclude SARS-CoV-2 infection, do not rule out co-infections with other pathogens, and should not be used as the sole basis for treatment or other patient management decisions. Negative results must be combined with clinical observations, patient history, and epidemiological information. The expected result is Negative.  Fact Sheet for Patients: SugarRoll.be  Fact Sheet for Healthcare Providers: https://www.woods-mathews.com/  This test is not yet approved or cleared by the [place] FDA and  has been authorized for detection and/or diagnosis of SARS-CoV-2 by FDA under an Emergency Use Authorization (EUA). This EUA will remain  in  effect (meaning this test can be used) for the duration of the COVID-19 declaration under Se ction 564(b)(1) of the Act, 21 U.S.C. section 360bbb-3(b)(1), unless the authorization is terminated or revoked sooner.  Performed at [facility], [place] [address], [place], [place] [address]      Labs: BNP (last 3 results) Recent Labs    03/08/21 0813  BNP [id number]*   Basic Metabolic Panel: Recent Labs  Lab 03/08/21 1432 03/08/21 1839 03/10/21 0519 03/11/21 0350 03/12/21 0350 03/13/21 0318 03/14/21 0534  NA 135   < > 137 139 133* 133* 132*  K 4.2   < > 3.4* 3.2* 4.5 3.7 4.1  CL 102   < > 107 106 99 97* 97*  CO2 11*   < > 21* 25 25 28 26   GLUCOSE 561*   < > 252* 39* 344* 78 247*  BUN 36*   < > 25* 23 18 21 17   CREATININE 2.10*   < > 0.94 0.74 0.70 0.74 0.68  CALCIUM 8.0*   < >  8.1* 8.3* 8.1* 8.1* 7.8*  MG 2.0  --   --   --   --   --   --    < > = values in this interval not displayed.   Liver Function Tests: Recent Labs  Lab 03/08/21 0813  AST 36  ALT 17  ALKPHOS 40  BILITOT 2.1*  PROT 6.3*  ALBUMIN 4.0   Recent Labs  Lab 03/08/21 1055  LIPASE 74*   No results for input(s): AMMONIA in the last 168 hours. CBC: Recent Labs  Lab 03/08/21 0813 03/09/21 0242 03/10/21 0519 03/11/21 0350 03/12/21 0350 03/13/21 0318 03/14/21 0534  WBC 22.3*   < > 12.2* 9.7 9.3 10.0 9.8  NEUTROABS 17.1*  --   --   --   --   --   --   HGB 10.8*   < > 10.6* 11.7* 12.6 13.4 12.7  HCT 35.1*   < > 31.9* 34.5* 36.6 39.1 36.9  MCV 108.7*   < > 97.9 96.6 95.8 96.1 95.3  PLT 241   < > 145* 166 150 161 194   < > = values in this interval not displayed.   Cardiac Enzymes: No results for input(s): CKTOTAL, CKMB, CKMBINDEX, TROPONINI in the last 168 hours. BNP: Invalid input(s): POCBNP CBG: Recent Labs  Lab 03/13/21 1125 03/13/21 1649 03/13/21 2143 03/13/21 2217 03/14/21 0746  GLUCAP 254* 137* 69* 138* 277*   D-Dimer No results for input(s): DDIMER in the last 72 hours. Hgb  A1c No results for input(s): HGBA1C in the last 72 hours. Lipid Profile No results for input(s): CHOL, HDL, LDLCALC, TRIG, CHOLHDL, LDLDIRECT in the last 72 hours. Thyroid function studies No results for input(s): TSH, T4TOTAL, T3FREE, THYROIDAB in the last 72 hours.  Invalid input(s): FREET3 Anemia work up No results for input(s): VITAMINB12, FOLATE, FERRITIN, TIBC, IRON, RETICCTPCT in the last 72 hours. Urinalysis    Component Value Date/Time   COLORURINE YELLOW (A) 03/08/2021 1100   APPEARANCEUR HAZY (A) 03/08/2021 1100   LABSPEC 1.018 03/08/2021 1100   PHURINE 5.0 03/08/2021 1100   GLUCOSEU >=500 (A) 03/08/2021 1100   HGBUR MODERATE (A) 03/08/2021 1100   BILIRUBINUR NEGATIVE 03/08/2021 1100   KETONESUR 80 (A) 03/08/2021 1100   PROTEINUR 30 (A) 03/08/2021 1100   NITRITE NEGATIVE 03/08/2021 1100   LEUKOCYTESUR NEGATIVE 03/08/2021 1100   Sepsis Labs Invalid input(s): PROCALCITONIN,  WBC,  LACTICIDVEN Microbiology Recent Results (from the past 240 hour(s))  Blood Culture (routine x 2)     Status: None   Collection Time: 03/08/21  8:13 AM   Specimen: BLOOD  Result Value Ref Range Status   Specimen Description BLOOD BLOOD RIGHT FOREARM  Final   Special Requests   Final    BOTTLES DRAWN AEROBIC AND ANAEROBIC Blood Culture adequate volume   Culture   Final    NO GROWTH 5 DAYS Performed at [facility], [address] [place], [place] [address]    Report Status 03/13/2021 FINAL  Final  Blood Culture (routine x 2)     Status: None   Collection Time: 03/08/21  8:13 AM   Specimen: BLOOD  Result Value Ref Range Status   Specimen Description BLOOD BLOOD RIGHT HAND  Final   Special Requests   Final    BOTTLES DRAWN AEROBIC AND ANAEROBIC Blood Culture adequate volume   Culture   Final    NO GROWTH 5 DAYS Performed at [facility], [address], [place], [place] [address]  Report Status 03/13/2021 FINAL  Final  Resp Panel by RT-PCR (Flu A&B, Covid)  Nasopharyngeal Swab     Status: None   Collection Time: 03/08/21  8:13 AM   Specimen: Nasopharyngeal Swab; Nasopharyngeal(NP) swabs in vial transport medium  Result Value Ref Range Status   SARS Coronavirus 2 by RT PCR NEGATIVE NEGATIVE Final    Comment: (NOTE) SARS-CoV-2 target nucleic acids are NOT DETECTED.  The SARS-CoV-2 RNA is generally detectable in upper respiratory specimens during the acute phase of infection. The lowest concentration of SARS-CoV-2 viral copies this assay can detect is 138 copies/mL. A negative result does not preclude SARS-Cov-2 infection and should not be used as the sole basis for treatment or other patient management decisions. A negative result may occur with  improper specimen collection/handling, submission of specimen other than nasopharyngeal swab, presence of viral mutation(s) within the areas targeted by this assay, and inadequate number of viral copies(<138 copies/mL). A negative result must be combined with clinical observations, patient history, and epidemiological information. The expected result is Negative.  Fact Sheet for Patients:  EntrepreneurPulse.com.au  Fact Sheet for Healthcare Providers:  IncredibleEmployment.be  This test is no t yet approved or cleared by the [place] FDA and  has been authorized for detection and/or diagnosis of SARS-CoV-2 by FDA under an Emergency Use Authorization (EUA). This EUA will remain  in effect (meaning this test can be used) for the duration of the COVID-19 declaration under Section 564(b)(1) of the Act, 21 U.S.C.section 360bbb-3(b)(1), unless the authorization is terminated  or revoked sooner.       Influenza A by PCR NEGATIVE NEGATIVE Final   Influenza B by PCR NEGATIVE NEGATIVE Final    Comment: (NOTE) The Xpert Xpress SARS-CoV-2/FLU/RSV plus assay is intended as an aid in the diagnosis of influenza from Nasopharyngeal swab specimens and should not be  used as a sole basis for treatment. Nasal washings and aspirates are unacceptable for Xpert Xpress SARS-CoV-2/FLU/RSV testing.  Fact Sheet for Patients: EntrepreneurPulse.com.au  Fact Sheet for Healthcare Providers: IncredibleEmployment.be  This test is not yet approved or cleared by the [place] FDA and has been authorized for detection and/or diagnosis of SARS-CoV-2 by FDA under an Emergency Use Authorization (EUA). This EUA will remain in effect (meaning this test can be used) for the duration of the COVID-19 declaration under Section 564(b)(1) of the Act, 21 U.S.C. section 360bbb-3(b)(1), unless the authorization is terminated or revoked.  Performed at [facility], [address], [place], [place] [address]   Urine culture     Status: None   Collection Time: 03/08/21 11:00 AM   Specimen: In/Out Cath Urine  Result Value Ref Range Status   Specimen Description   Final    IN/OUT CATH URINE Performed at [facility], [address], [place], [place] [address]    Special Requests   Final    NONE Performed at [facility], [address] [place], [place] [address]    Culture   Final    NO GROWTH Performed at [facility], [place] [address], [place], [place] [address]    Report Status 03/09/2021 FINAL  Final  MRSA PCR Screening     Status: None   Collection Time: 03/08/21 12:05 PM   Specimen: Nasopharyngeal  Result Value Ref Range Status   MRSA by PCR NEGATIVE NEGATIVE Final    Comment:        The GeneXpert MRSA Assay (FDA approved for NASAL specimens only), is  one component of a comprehensive MRSA colonization surveillance program. It is not intended to diagnose MRSA infection nor to guide or monitor treatment for MRSA infections. Performed at [facility], [place], [place] [address]   SARS CORONAVIRUS 2 (TAT 6-24 HRS) Nasopharyngeal Nasopharyngeal Swab     Status:  None   Collection Time: 03/13/21  3:50 PM   Specimen: Nasopharyngeal Swab  Result Value Ref Range Status   SARS Coronavirus 2 NEGATIVE NEGATIVE Final    Comment: (NOTE) SARS-CoV-2 target nucleic acids are NOT DETECTED.  The SARS-CoV-2 RNA is generally detectable in upper and lower respiratory specimens during the acute phase of infection. Negative results do not preclude SARS-CoV-2 infection, do not rule out co-infections with other pathogens, and should not be used as the sole basis for treatment or other patient management decisions. Negative results must be combined with clinical observations, patient history, and epidemiological information. The expected result is Negative.  Fact Sheet for Patients: SugarRoll.be  Fact Sheet for Healthcare Providers: https://www.woods-mathews.com/  This test is not yet approved or cleared by the [place] FDA and  has been authorized for detection and/or diagnosis of SARS-CoV-2 by FDA under an Emergency Use Authorization (EUA). This EUA will remain  in effect (meaning this test can be used) for the duration of the COVID-19 declaration under Se ction 564(b)(1) of the Act, 21 U.S.C. section 360bbb-3(b)(1), unless the authorization is terminated or revoked sooner.  Performed at [facility], [place] [address], [place], [place] [address]      Time coordinating discharge: Over 30 minutes  SIGNED:   Wyvonnia Dusky, MD  [facility] 03/14/2021, 9:19 AM Pager   If 7PM-7AM, please contact night-coverage

## 2021-03-11 NOTE — TOC Initial Note (Addendum)
Transition of Care [facility]) - Initial/Assessment Note    Patient Details  Name: [name] [name] MRN: [id number] Date of Birth: 1941-05-09  Transition of Care [facility]) CM/SW Contact:    Magnus Ivan, LCSW Phone Number: 03/11/2021, 12:29 PM  Clinical Narrative:                CSW spoke with patient's spouse. Patient to discharge home today. Spouse to transport. Patient lives with spouse. PCP is Dr. Ginette Pitman. Pharmacy is [facility]. No DME/HH/SNF. Patient's husband is agreeable to recommendations for Outpatient Palliative Care, HHPT/OT/RN, 3 in 1, and RW. Referrals made to [place] with [facility], Brenton Grills with [facility], and [place] with [facility] (accepted).  3:30- DC delayed to 4/24. Notified [place] with [facility]. Expected Discharge Plan: [place] Barriers to Discharge: Barriers Resolved   Patient Goals and CMS Choice Patient states their goals for this hospitalization and ongoing recovery are:: home with home health CMS Medicare.gov Compare Post Acute Care list provided to:: Patient Represenative (must comment) Choice offered to / list presented to : Spouse  Expected Discharge Plan and Services Expected Discharge Plan: [place]       Living arrangements for the past 2 months: Single Family Home Expected Discharge Date: 03/11/21               DME Arranged: Gilford Rile rolling,3-N-1 DME Agency:  [place] Aver) Date DME Agency Contacted: 03/11/21   Representative spoke with at DME Agency: [place]: PT,OT,RN [place] Agency: [place] ([place]) Date [place]: 03/11/21   Representative spoke with at [place]: [place]ene Flock  Prior Living Arrangements/Services Living arrangements for the past 2 months: Single Family Home Lives with:: Spouse Patient language and need for interpreter reviewed:: Yes Do you feel safe going back to the place where you live?: Yes      Need for Family  Participation in Patient Care: Yes (Comment) Care giver support system in place?: Yes (comment)   Criminal Activity/Legal Involvement Pertinent to Current Situation/Hospitalization: No - Comment as needed  Activities of Daily Living Home Assistive Devices/Equipment: Eyeglasses,CBG Meter (insulin supplies) ADL Screening (condition at time of admission) Patient's cognitive ability adequate to safely complete daily activities?: Yes Is the patient deaf or have difficulty hearing?: Yes Does the patient have difficulty seeing, even when wearing glasses/contacts?: No Does the patient have difficulty concentrating, remembering, or making decisions?: Yes Patient able to express need for assistance with ADLs?: Yes Does the patient have difficulty dressing or bathing?: No Independently performs ADLs?: Yes (appropriate for developmental age) Does the patient have difficulty walking or climbing stairs?: Yes Weakness of Legs: Both Weakness of Arms/Hands: Both  Permission Sought/Granted Permission sought to share information with : Chartered certified accountant granted to share information with : Yes, Verbal Permission Granted     Permission granted to share info w AGENCY: [place], DME agencies        Emotional Assessment       Orientation: : Fluctuating Orientation (Suspected and/or reported Sundowners) Alcohol / Substance Use: Not Applicable Psych Involvement: No (comment)  Admission diagnosis:  DKA (diabetic ketoacidosis) ([place]) [E11.10] Hyperglycemia [R73.9] Altered mental status, unspecified altered mental status type [R41.82] Patient Active Problem List   Diagnosis Date Noted  . DKA (diabetic ketoacidosis) ([place]) 03/08/2021  . HTN (hypertension) 03/08/2021  . Acute metabolic encephalopathy 40/98/1191  . SIRS (systemic inflammatory response syndrome) ([place]) 03/08/2021  . Hyperkalemia 03/08/2021  . Lactic  acid acidosis 03/08/2021  . Nausea & vomiting 03/08/2021  . NSVT  (nonsustained ventricular tachycardia) ([place]) 03/08/2021  . NSTEMI (non-ST elevated myocardial infarction) ([place]) 03/08/2021  . Erroneous encounter - disregard 10/09/2019  . Pain due to onychomycosis of toenails of both feet 05/14/2019  . Porokeratosis 05/14/2019  . Encephalopathy acute 11/22/2017  . Pneumonia, community acquired 11/22/2017  . Pseudogout 11/22/2017  . Protein-calorie malnutrition, severe 11/14/2017  . AKI (acute kidney injury) ([place]) 11/13/2017  . DKA (diabetic ketoacidoses) 07/31/2017  . Hyponatremia 06/18/2016  . Osteoporosis 04/23/2016  . Breast cancer ([place]) 03/30/2016  . Hyperlipemia 03/30/2016  . BP (high blood pressure) 03/30/2016  . Hypothyroidism 03/30/2016  . Type 1 diabetes mellitus with hypoglycemia and without coma ([place]) 02/10/2016  . Type 1 diabetes mellitus without complication ([place]) 62/56/3893  . Dermatophytosis of nail 08/24/2013  . Pain in lower limb 08/24/2013   PCP:  Tracie Harrier, MD Pharmacy:   [facility] [address] [place] [address] Phone: [phone] Fax: [phone]  [facility], [place] [address] Phone: [phone] Fax: [phone]  [facility] [place], [place] [address] [place] [address] Phone: [phone] Fax: [phone]     Social Determinants of Health (SDOH) Interventions    Readmission Risk Interventions No flowsheet data found.

## 2021-03-11 NOTE — Discharge Summary (Signed)
Addendum to discharge summary Diagnosis non-STEMI As part of discharge for cardiology we have added beta-blocker metoprolol 12.5 twice a day Discharge from is already dictated by hospitalist I discussed the case with the patient and daughter For cardiology recommend aspirin statin beta-blocker ACE inhibitor Follow-up with cardiology 1 to 2 weeks upon discharge [facility]

## 2021-03-11 NOTE — Progress Notes (Signed)
[facility] Cardiology    SUBJECTIVE: Patient states been reasonably well she is in the room with her daughter Case was discussed about her non-STEMI as well as DKA is not clear to me whether the patient started with an MI or started with DKA but clearly had both patient is asymptomatic no chest pain or shortness of breath and is prepared for discharge no previous cardiac history   Vitals:   03/10/21 1147 03/10/21 2106 03/11/21 0546 03/11/21 1206  BP: 122/64 136/68 (!) 148/78 134/72  Pulse: 77 85 79 89  Resp: 16 16 14 16   Temp: 98.4 F (36.9 C) 99 F (37.2 C) (!) 97.5 F (36.4 C) 98.1 F (36.7 C)  TempSrc: Oral Oral  Oral  SpO2: 97% 96% 100% 97%  Weight:      Height:         Intake/Output Summary (Last 24 hours) at 03/11/2021 1350 Last data filed at 03/11/2021 0900 Gross per 24 hour  Intake 120 ml  Output 800 ml  Net -680 ml      PHYSICAL EXAM Frail-appearing patient General: Well developed, well nourished, in no acute distress HEENT:  Normocephalic and atramatic Neck:  No JVD.  Lungs: Clear bilaterally to auscultation and percussion. Heart: HRRR . Normal S1 and S2 without gallops or murmurs.  Abdomen: Bowel sounds are positive, abdomen soft and non-tender  Msk:  Back normal, normal gait. Normal strength and tone for age. Extremities: No clubbing, cyanosis or edema.   Neuro: Alert and oriented X 3. Psych:  Good affect, responds appropriately   LABS: Basic Metabolic Panel: Recent Labs    03/08/21 1432 03/08/21 1839 03/10/21 0519 03/11/21 0350  NA 135   < > 137 139  K 4.2   < > 3.4* 3.2*  CL 102   < > 107 106  CO2 11*   < > 21* 25  GLUCOSE 561*   < > 252* 39*  BUN 36*   < > 25* 23  CREATININE 2.10*   < > 0.94 0.74  CALCIUM 8.0*   < > 8.1* 8.3*  MG 2.0  --   --   --    < > = values in this interval not displayed.   Liver Function Tests: No results for input(s): AST, ALT, ALKPHOS, BILITOT, PROT, ALBUMIN in the last 72 hours. No results for input(s): LIPASE,  AMYLASE in the last 72 hours. CBC: Recent Labs    03/10/21 0519 03/11/21 0350  WBC 12.2* 9.7  HGB 10.6* 11.7*  HCT 31.9* 34.5*  MCV 97.9 96.6  PLT 145* 166   Cardiac Enzymes: No results for input(s): CKTOTAL, CKMB, CKMBINDEX, TROPONINI in the last 72 hours. BNP: Invalid input(s): POCBNP D-Dimer: No results for input(s): DDIMER in the last 72 hours. Hemoglobin A1C: Recent Labs    03/09/21 0242  HGBA1C 9.0*   Fasting Lipid Panel: Recent Labs    03/09/21 0242  CHOL 137  HDL 76  LDLCALC 48  TRIG 64  CHOLHDL 1.8   Thyroid Function Tests: No results for input(s): TSH, T4TOTAL, T3FREE, THYROIDAB in the last 72 hours.  Invalid input(s): FREET3 Anemia Panel: Recent Labs    03/11/21 0350  VITAMINB12 312  FOLATE 5.0*    No results found.   Echo borderline low LV function EF of around 50 to 55% with inferior hypo-  TELEMETRY: Sinus rhythm at around 85 interventricular conduction delay  ASSESSMENT AND PLAN:  Principal Problem:   DKA (diabetic ketoacidosis) ([place]) Active Problems:   Breast  cancer ([place])   Hyperlipemia   Hypothyroidism   Type 1 diabetes mellitus without complication (HCC)   AKI (acute kidney injury) ([place])   HTN (hypertension)   Acute metabolic encephalopathy   SIRS (systemic inflammatory response syndrome) (HCC)   Hyperkalemia   Nausea & vomiting   NSVT (nonsustained ventricular tachycardia) (HCC)   NSTEMI (non-ST elevated myocardial infarction) ([place])    Plan Recommend medical therapy for non-STEMI Maintain patient on aspirin statin beta-blocker ACE inhibitor Defer cardiac cath at this point Continue aggressive medical therapy from a cardiac standpoint Echocardiogram suggests relatively preserved left ventricular function patient is not having chest pain medical therapy I think is warranted Patient should maintain adequate diabetes management and control Try to avoid nephrotoxic drugs Follow-up with cardiology 1 to 2 weeks   Yolonda Kida, MD 03/11/2021 1:50 PM

## 2021-03-12 LAB — BASIC METABOLIC PANEL
Anion gap: 9 (ref 5–15)
BUN: 18 mg/dL (ref 8–23)
CO2: 25 mmol/L (ref 22–32)
Calcium: 8.1 mg/dL — ABNORMAL LOW (ref 8.9–10.3)
Chloride: 99 mmol/L (ref 98–111)
Creatinine, Ser: 0.7 mg/dL (ref 0.44–1.00)
GFR, Estimated: >60 mL/min (ref >60)
Glucose, Bld: 344 mg/dL — ABNORMAL HIGH (ref 70–99)
Potassium: 4.5 mmol/L (ref 3.5–5.1)
Sodium: 133 mmol/L — ABNORMAL LOW (ref 135–145)

## 2021-03-12 LAB — GLUCOSE, CAPILLARY
Glucose-Capillary: 386 mg/dL — ABNORMAL HIGH (ref 70–99)
Glucose-Capillary: 444 mg/dL — ABNORMAL HIGH (ref 70–99)
Glucose-Capillary: 101 mg/dL — ABNORMAL HIGH (ref 70–99)
Glucose-Capillary: 270 mg/dL — ABNORMAL HIGH (ref 70–99)

## 2021-03-12 LAB — CBC
HCT: 36.6 % (ref 36.0–46.0)
Hemoglobin: 12.6 g/dL (ref 12.0–15.0)
MCH: 33 pg (ref 26.0–34.0)
MCHC: 34.4 g/dL (ref 30.0–36.0)
MCV: 95.8 fL (ref 80.0–100.0)
Platelets: 150 10*3/uL (ref 150–400)
RBC: 3.82 MIL/uL — ABNORMAL LOW (ref 3.87–5.11)
RDW: 13.7 % (ref 11.5–15.5)
WBC: 9.3 10*3/uL (ref 4.0–10.5)
nRBC: 0 % (ref 0.0–0.2)

## 2021-03-12 NOTE — Plan of Care (Signed)
Continuing with plan of care. 

## 2021-03-12 NOTE — Progress Notes (Signed)
PROGRESS NOTE    [name] [name]  KVQ:[id number] DOB: [birth year] DOA: 03/08/2021 PCP: Tracie Harrier, MD    Assessment & Plan:   Principal Problem:   DKA (diabetic ketoacidosis) ([place]) Active Problems:   Breast cancer ([place])   Hyperlipemia   Hypothyroidism   Type 1 diabetes mellitus without complication ([place])   AKI (acute kidney injury) ([place])   HTN (hypertension)   Acute metabolic encephalopathy   SIRS (systemic inflammatory response syndrome) ([facility])   Hyperkalemia   Nausea & vomiting   NSVT (nonsustained ventricular tachycardia) ([facility])   NSTEMI (non-ST elevated myocardial infarction) ([place])  SIRS: meets criteria w/ leukocytosis, tachycardic, tachypnea, & elevated lactic acid & no source of infection identified.  UA is neg. CXR is neg. Urinalysis negative. Procalcitonin 0.58. Blood cxs NGTD. Urine cx shows no growth. Resolved   NSTEMI: d/c IV heparin as per cardio. Not currently a candidate for cardiac cath as per cardio. Continue on tele. Cardio is following and recs apprec  Hyperkalemia: resolved  Hypokalemia: WNL today   Thrombocytopenia: resolved   Acute metabolic encephalopathy: labile. Likely secondary to DKA. CT head shows no acute intracranial findings   Leukocytosis: resolved   Macrocytic anemia: likely secondary to folic acid deficiency. Continue on folic acid supplement   History of breast cancer: s/p of right mastectomy. Continue on home dose of tamoxifen   HLD: continue on statin   Hypothyroidism: continue on home dose of levothyroxine   DM1: w/ DKA on admission which has resolved. Poorly controlled w/ HbA1c 8.1. Continue on lantus, SSI w/ accuchecks   AKI: resolved    HTN: continue to hold ACE-I for low normal BP   DVT prophylaxis: lovenox  Code Status: full  Family Communication:  Disposition Plan: d/c to SNF, CM is working on this  Level of care: Med-Surg   Status is: Inpatient  Remains inpatient appropriate because:Ongoing diagnostic  testing needed not appropriate for outpatient work up, Unsafe d/c plan, IV treatments appropriate due to intensity of illness or inability to take PO and Inpatient level of care appropriate due to severity of illness, therapy is now recommending SNF, CM is working on this    Dispo: The patient is from: Home              Anticipated d/c is to: SNF              Patient currently: is medically stable    Difficult to place patient: unclear   Consultants:   Cardio    Procedures:   Antimicrobials:    Subjective: Pt c/o fatigue    Objective: Vitals:   03/11/21 2111 03/12/21 0003 03/12/21 0433 03/12/21 1126  BP: 139/75 134/69 (!) 147/73 (!) 110/58  Pulse: 93 77 76 86  Resp: 16 19 14 16   Temp: 97.8 F (36.6 C) 98.1 F (36.7 C) 98.7 F (37.1 C) 98.2 F (36.8 C)  TempSrc: Oral Oral Oral Oral  SpO2: 98% 99% 96% 97%  Weight:      Height:        Intake/Output Summary (Last 24 hours) at 03/12/2021 1140 Last data filed at 03/12/2021 0920 Gross per 24 hour  Intake 480 ml  Output 1400 ml  Net -920 ml   Filed Weights   03/08/21 0805  Weight: 54.4 kg    Examination:  General exam: Appears calm & comfortable  Respiratory system: Clear breath sounds b/l  Cardiovascular system: S1 & S2+. No rubs or clicks  Gastrointestinal system: Abd is soft, NT, ND &  normal bowel sounds  Central nervous system: Alert and awake. Moves all extremities Psychiatry: Judgement and insight appear abnormal. Flat mood and affect    Data Reviewed: I have personally reviewed following labs and imaging studies  CBC: Recent Labs  Lab 03/08/21 0813 03/09/21 0242 03/10/21 0519 03/11/21 0350 03/12/21 0350  WBC 22.3* 20.8* 12.2* 9.7 9.3  NEUTROABS 17.1*  --   --   --   --   HGB 10.8* 10.5* 10.6* 11.7* 12.6  HCT 35.1* 30.4* 31.9* 34.5* 36.6  MCV 108.7* 95.9 97.9 96.6 95.8  PLT 241 192 145* 166 [id number]   Basic Metabolic Panel: Recent Labs  Lab 03/08/21 1432 03/08/21 1839 03/08/21 2257  03/09/21 0242 03/10/21 0519 03/11/21 0350 03/12/21 0350  NA 135   < > 136 136 137 139 133*  K 4.2   < > 3.7 3.5 3.4* 3.2* 4.5  CL 102   < > 106 107 107 106 99  CO2 11*   < > 20* 22 21* 25 25  GLUCOSE 561*   < > 198* 156* 252* 39* 344*  BUN 36*   < > 33* 33* 25* 23 18  CREATININE 2.10*   < > 1.56* 1.42* 0.94 0.74 0.70  CALCIUM 8.0*   < > 8.3* 8.1* 8.1* 8.3* 8.1*  MG 2.0  --   --   --   --   --   --    < > = values in this interval not displayed.   GFR: Estimated Creatinine Clearance: 46.6 mL/min (by C-G formula based on SCr of 0.7 mg/dL). Liver Function Tests: Recent Labs  Lab 03/08/21 0813  AST 36  ALT 17  ALKPHOS 40  BILITOT 2.1*  PROT 6.3*  ALBUMIN 4.0   Recent Labs  Lab 03/08/21 1055  LIPASE 74*   No results for input(s): AMMONIA in the last 168 hours. Coagulation Profile: No results for input(s): INR, PROTIME in the last 168 hours. Cardiac Enzymes: No results for input(s): CKTOTAL, CKMB, CKMBINDEX, TROPONINI in the last 168 hours. BNP (last 3 results) No results for input(s): PROBNP in the last 8760 hours. HbA1C: No results for input(s): HGBA1C in the last 72 hours. CBG: Recent Labs  Lab 03/11/21 2115 03/11/21 2142 03/11/21 2223 03/12/21 0743 03/12/21 1128  GLUCAP 59* 87 163* 386* 444*   Lipid Profile: No results for input(s): CHOL, HDL, LDLCALC, TRIG, CHOLHDL, LDLDIRECT in the last 72 hours. Thyroid Function Tests: No results for input(s): TSH, T4TOTAL, FREET4, T3FREE, THYROIDAB in the last 72 hours. Anemia Panel: Recent Labs    03/11/21 0350  VITAMINB12 312  FOLATE 5.0*   Sepsis Labs: Recent Labs  Lab 03/08/21 1055 03/08/21 1205 03/08/21 1432 03/08/21 2003 03/08/21 2257  PROCALCITON 5.86  --   --   --   --   LATICACIDVEN  --  7.1* 6.4* 3.2* 2.4*    Recent Results (from the past 240 hour(s))  Blood Culture (routine x 2)     Status: None (Preliminary result)   Collection Time: 03/08/21  8:13 AM   Specimen: BLOOD  Result Value Ref  Range Status   Specimen Description BLOOD BLOOD RIGHT FOREARM  Final   Special Requests   Final    BOTTLES DRAWN AEROBIC AND ANAEROBIC Blood Culture adequate volume   Culture   Final    NO GROWTH 4 DAYS Performed at [facility], [address] [place], [place] [address]    Report Status PENDING  Incomplete  Blood Culture (routine x  2)     Status: None (Preliminary result)   Collection Time: 03/08/21  8:13 AM   Specimen: BLOOD  Result Value Ref Range Status   Specimen Description BLOOD BLOOD RIGHT HAND  Final   Special Requests   Final    BOTTLES DRAWN AEROBIC AND ANAEROBIC Blood Culture adequate volume   Culture   Final    NO GROWTH 4 DAYS Performed at [facility], [address], [place], [place] [address]    Report Status PENDING  Incomplete  Resp Panel by RT-PCR (Flu A&B, Covid) Nasopharyngeal Swab     Status: None   Collection Time: 03/08/21  8:13 AM   Specimen: Nasopharyngeal Swab; Nasopharyngeal(NP) swabs in vial transport medium  Result Value Ref Range Status   SARS Coronavirus 2 by RT PCR NEGATIVE NEGATIVE Final    Comment: (NOTE) SARS-CoV-2 target nucleic acids are NOT DETECTED.  The SARS-CoV-2 RNA is generally detectable in upper respiratory specimens during the acute phase of infection. The lowest concentration of SARS-CoV-2 viral copies this assay can detect is 138 copies/mL. A negative result does not preclude SARS-Cov-2 infection and should not be used as the sole basis for treatment or other patient management decisions. A negative result may occur with  improper specimen collection/handling, submission of specimen other than nasopharyngeal swab, presence of viral mutation(s) within the areas targeted by this assay, and inadequate number of viral copies(<138 copies/mL). A negative result must be combined with clinical observations, patient history, and epidemiological information. The expected result is Negative.  Fact Sheet for  Patients:  EntrepreneurPulse.com.au  Fact Sheet for Healthcare Providers:  IncredibleEmployment.be  This test is no t yet approved or cleared by the [place] FDA and  has been authorized for detection and/or diagnosis of SARS-CoV-2 by FDA under an Emergency Use Authorization (EUA). This EUA will remain  in effect (meaning this test can be used) for the duration of the COVID-19 declaration under Section 564(b)(1) of the Act, 21 U.S.C.section 360bbb-3(b)(1), unless the authorization is terminated  or revoked sooner.       Influenza A by PCR NEGATIVE NEGATIVE Final   Influenza B by PCR NEGATIVE NEGATIVE Final    Comment: (NOTE) The Xpert Xpress SARS-CoV-2/FLU/RSV plus assay is intended as an aid in the diagnosis of influenza from Nasopharyngeal swab specimens and should not be used as a sole basis for treatment. Nasal washings and aspirates are unacceptable for Xpert Xpress SARS-CoV-2/FLU/RSV testing.  Fact Sheet for Patients: EntrepreneurPulse.com.au  Fact Sheet for Healthcare Providers: IncredibleEmployment.be  This test is not yet approved or cleared by the [place] FDA and has been authorized for detection and/or diagnosis of SARS-CoV-2 by FDA under an Emergency Use Authorization (EUA). This EUA will remain in effect (meaning this test can be used) for the duration of the COVID-19 declaration under Section 564(b)(1) of the Act, 21 U.S.C. section 360bbb-3(b)(1), unless the authorization is terminated or revoked.  Performed at [facility], [address] [place], [place] [address]   Urine culture     Status: None   Collection Time: 03/08/21 11:00 AM   Specimen: In/Out Cath Urine  Result Value Ref Range Status   Specimen Description   Final    IN/OUT CATH URINE Performed at [facility], [address], [place], [place] [address]    Special Requests   Final     NONE Performed at [facility], [address] [place], [place] [address]    Culture   Final  NO GROWTH Performed at [facility], [place] [address], [place], [place] [address]    Report Status 03/09/2021 FINAL  Final  MRSA PCR Screening     Status: None   Collection Time: 03/08/21 12:05 PM   Specimen: Nasopharyngeal  Result Value Ref Range Status   MRSA by PCR NEGATIVE NEGATIVE Final    Comment:        The GeneXpert MRSA Assay (FDA approved for NASAL specimens only), is one component of a comprehensive MRSA colonization surveillance program. It is not intended to diagnose MRSA infection nor to guide or monitor treatment for MRSA infections. Performed at [facility], [address] [place], [place] [address]          Radiology Studies: No results found.      Scheduled Meds: . aspirin EC  81 mg Oral Daily  . atorvastatin  80 mg Oral Daily  . calcium-vitamin D  1 tablet Oral Daily  . Chlorhexidine Gluconate Cloth  6 each Topical Daily  . dextrose  25 mL Intravenous Once  . enoxaparin (LOVENOX) injection  40 mg Subcutaneous Q24H  . folic acid  1 mg Oral Daily  . insulin aspart  0-15 Units Subcutaneous TID AC & HS  . insulin glargine  10 Units Subcutaneous QHS  . levothyroxine  25 mcg Oral Q0600  . metoprolol tartrate  12.5 mg Oral BID  . tamoxifen  20 mg Oral Daily   Continuous Infusions:    LOS: 4 days    Time spent: 33 mins     Wyvonnia Dusky, MD [facility] Pager [phone]  If 7PM-7AM, please contact night-coverage 03/12/2021, 11:40 AM

## 2021-03-12 NOTE — TOC Progression Note (Addendum)
Transition of Care [facility]) - Progression Note    Patient Details  Name: [name] [name] MRN: [id number] Date of Birth: [birth year]  Transition of Care [facility]) CM/SW [place], LCSW Phone Number: 03/12/2021, 9:18 AM  Clinical Narrative:   CSW called and spoke to patient's daughter [name] [name]. Informed her patient is medically ready for DC and discussed conversation with patient's spouse yesterday who was agreeable to home with [facility]. Terri reported she and patient's spouse are concerned about patient returning home and feel patient needs short term rehab. She reported she is concerned about patient's spouse being able to meet her needs until she is stronger and eating better. Explained that PT must recommend SNF for insurance to cover it. She verbalized understanding and asked if PT can reassess. Updated MD and RN.  10:45- PT recommends SNF. CSW starting work up. Updated [place] with [facility]. Will need to start SNF/EMS auth when SNF is chosen.  Expected Discharge Plan: [place] Barriers to Discharge: Barriers Resolved  Expected Discharge Plan and Services Expected Discharge Plan: [place] arrangements for the past 2 months: Single Family Home Expected Discharge Date: 03/11/21               DME Arranged: Gilford Rile rolling,3-N-1 DME Agency:  [facility] Aver) Date DME Agency Contacted: 03/11/21   Representative spoke with at DME Agency: Brenton Grills HH Arranged: PT,OT,RN [place]: [place] ([place]) Date [place]: 03/11/21   Representative spoke with at [place]: Floydene Flock   Social Determinants of Health (SDOH) Interventions    Readmission Risk Interventions No flowsheet data found.

## 2021-03-12 NOTE — Plan of Care (Signed)
Patient is alert and orientedx3. Has episodes of confusion. V/S stable. Denies any pain. Fall precautions kept in place.

## 2021-03-12 NOTE — Progress Notes (Signed)
PT addendum  Filed PT note 10:20am.  Forgot to change D/C recs to SNF officially in flow file.  Updated to reflect SNF recommendation.

## 2021-03-12 NOTE — Progress Notes (Signed)
[facility] Cardiology    SUBJECTIVE: Patient feels reasonably well denies any chest pain has more energy still is unable to ambulate much but no worsening shortness of breath complains of generalized weakness   Vitals:   03/11/21 2111 03/12/21 0003 03/12/21 0433 03/12/21 1126  BP: 139/75 134/69 (!) 147/73 (!) 110/58  Pulse: 93 77 76 86  Resp: 16 19 14 16   Temp: 97.8 F (36.6 C) 98.1 F (36.7 C) 98.7 F (37.1 C) 98.2 F (36.8 C)  TempSrc: Oral Oral Oral Oral  SpO2: 98% 99% 96% 97%  Weight:      Height:         Intake/Output Summary (Last 24 hours) at 03/12/2021 1207 Last data filed at 03/12/2021 0920 Gross per 24 hour  Intake 480 ml  Output 1400 ml  Net -920 ml      PHYSICAL EXAM  General: Well developed, well nourished, in no acute distress HEENT:  Normocephalic and atramatic Neck:  No JVD.  Lungs: Clear bilaterally to auscultation and percussion. Heart: HRRR . Normal S1 and S2 without gallops or murmurs.  Abdomen: Bowel sounds are positive, abdomen soft and non-tender  Msk:  Back normal, normal gait. Normal strength and tone for age. Extremities: No clubbing, cyanosis or edema.   Neuro: Alert and oriented X 3. Psych:  Good affect, responds appropriately   LABS: Basic Metabolic Panel: Recent Labs    03/11/21 0350 03/12/21 0350  NA 139 133*  K 3.2* 4.5  CL 106 99  CO2 25 25  GLUCOSE 39* 344*  BUN 23 18  CREATININE 0.74 0.70  CALCIUM 8.3* 8.1*   Liver Function Tests: No results for input(s): AST, ALT, ALKPHOS, BILITOT, PROT, ALBUMIN in the last 72 hours. No results for input(s): LIPASE, AMYLASE in the last 72 hours. CBC: Recent Labs    03/11/21 0350 03/12/21 0350  WBC 9.7 9.3  HGB 11.7* 12.6  HCT 34.5* 36.6  MCV 96.6 95.8  PLT 166 150   Cardiac Enzymes: No results for input(s): CKTOTAL, CKMB, CKMBINDEX, TROPONINI in the last 72 hours. BNP: Invalid input(s): POCBNP D-Dimer: No results for input(s): DDIMER in the last 72 hours. Hemoglobin A1C: No  results for input(s): HGBA1C in the last 72 hours. Fasting Lipid Panel: No results for input(s): CHOL, HDL, LDLCALC, TRIG, CHOLHDL, LDLDIRECT in the last 72 hours. Thyroid Function Tests: No results for input(s): TSH, T4TOTAL, T3FREE, THYROIDAB in the last 72 hours.  Invalid input(s): FREET3 Anemia Panel: Recent Labs    03/11/21 0350  VITAMINB12 312  FOLATE 5.0*    No results found.   Echo mild depressed left ventricular function ejection fraction around 45%  TELEMETRY: Normal sinus rhythm rate of 90 nonspecific finding  ASSESSMENT AND PLAN:  Principal Problem:   DKA (diabetic ketoacidosis) ([place]) Active Problems:   Breast cancer ([place])   Hyperlipemia   Hypothyroidism   Type 1 diabetes mellitus without complication (H[facility])   AKI (acute kidney injury) ([place])   HTN (hypertension)   Acute metabolic encephalopathy   SIRS (systemic inflammatory response syndrome) (H[facility])   Hyperkalemia   Nausea & vomiting   NSVT (nonsustained ventricular tachycardia) (HCC)   NSTEMI (non-ST elevated myocardial infarction) ([place])    Plan Agree with medical therapy for non-STEMI Continue antibiotic therapy for sepsis Treat empirically for coronary disease on non-STEMI Continue diabetes management DKA with insulin therapy dehydration Continue current therapy for coronary artery disease non-STEMI beta-blocker aspirin statin Hyperlipidemia continue statin therapy for lipid management Do not recommend invasive strategy at  this point since patient relatively asymptomatic We will have the patient follow-up with cardiology in 2 to 4 weeks Okay to discharge to rehab for now    Yolonda Kida, MD 03/12/2021 12:07 PM

## 2021-03-12 NOTE — Progress Notes (Signed)
Physical Therapy Treatment Patient Details Name: [name] [name] MRN: [id number] DOB: 1941/09/14 Today's Date: 03/12/2021    History of Present Illness [name] [name] is a 80 y.o. female with medical history significant of type 1 diabetes, DKA, hypertension, hyperlipidemia, breast cancer (s/p right mastectomy), hypothyroidism, who presents with altered mental status, nausea, vomiting, elevated blood sugar.    PT Comments    Called my MD to revisit pt this am prior to hoped discharge.  Family voicing concerns over safety of discharge.    Pt is able to get to EOB with mni a x 1.  Struggles getting hips to EOB.  Steady in sitting.  She stands pulling up on walker and once standing is unsteady and falls quickly back onto bed guided by Probation officer to safety.  She is cues to push from bed to stand and again pulls on walker.  Assist to steady pt to allow for gait.  She has significant difficulty managing walker in tighter spaces and for turns needing min assist for balance and walker positioning.  Short shuffling steps.  She is able to walk 100' in hallway with visible fatigue but does not initiate return to room without cues.  Again needs cues for navigating in room and does not reach back before sitting on bed when given time to chose on her own.  Returns to supine with min guard.  After session today, agree with family that SNF would be beneficial.  She has not progressed towards initially set goals and seemed to be generally weaker today with more difficulty.  She has been to rehab at [facility] in the past and has a good experience.  During discussion with family, husband has been helping pt walk at home - did not use AD prior and safety concerns are for both pt and husband as a fall for her would likely injure both of them.  SNF will allow for increased strength, balance and safety and to get a more appropriate long term plan in place for both of their needs.  MD, TOC, RN all aware.   Follow Up  Recommendations  Home health PT;Supervision for mobility/OOB     Equipment Recommendations  Rolling walker with 5" wheels;3in1 (PT)    Recommendations for Other Services       Precautions / Restrictions Precautions Precautions: Fall Restrictions Weight Bearing Restrictions: No    Mobility  Bed Mobility Overal bed mobility: Needs Assistance Bed Mobility: Supine to Sit;Sit to Supine     Supine to sit: Min assist Sit to supine: Min guard        Transfers Overall transfer level: Needs assistance Equipment used: Rolling walker (2 wheeled) Transfers: Sit to/from Stand Sit to Stand: Min assist         General transfer comment: max cues for hand placements on each transition  Ambulation/Gait Ambulation/Gait assistance: Min assist Gait Distance (Feet): 100 Feet Assistive device: Rolling walker (2 wheeled)   Gait velocity: decreased   General Gait Details: reliant on RW with hevy cues for position, navigating objects and overall safety   Stairs             Wheelchair Mobility    Modified Rankin (Stroke Patients Only)       Balance Overall balance assessment: Needs assistance Sitting-balance support: Feet supported Sitting balance-Leahy Scale: Good     Standing balance support: Bilateral upper extremity supported;During functional activity Standing balance-Leahy Scale: Poor Standing balance comment: Reliant on B UE support, min assist for safety  Cognition Arousal/Alertness: Awake/alert Behavior During Therapy: WFL for tasks assessed/performed Overall Cognitive Status: Within Functional Limits for tasks assessed                                 General Comments: family reports prior cognition deficits      Exercises      General Comments        Pertinent Vitals/Pain Pain Assessment: No/denies pain    Home Living                      Prior Function            PT Goals  (current goals can now be found in the care plan section) Progress towards PT goals: Not progressing toward goals - comment    Frequency    Min 2X/week      PT Plan Current plan remains appropriate    Co-evaluation              AM-PAC PT "6 Clicks" Mobility   Outcome Measure  Help needed turning from your back to your side while in a flat bed without using bedrails?: A Little Help needed moving from lying on your back to sitting on the side of a flat bed without using bedrails?: A Little Help needed moving to and from a bed to a chair (including a wheelchair)?: A Little Help needed standing up from a chair using your arms (e.g., wheelchair or bedside chair)?: A Little Help needed to walk in hospital room?: A Little Help needed climbing 3-5 steps with a railing? : A Lot 6 Click Score: 17    End of Session Equipment Utilized During Treatment: Gait belt Activity Tolerance: Patient tolerated treatment well Patient left: in bed;with call bell/phone within reach;with bed alarm set;with family/visitor present Nurse Communication: Mobility status PT Visit Diagnosis: Unsteadiness on feet (R26.81);Other abnormalities of gait and mobility (R26.89);Muscle weakness (generalized) (M62.81);Difficulty in walking, not elsewhere classified (R26.2)     Time: 7510-2585 PT Time Calculation (min) (ACUTE ONLY): 23 min  Charges:  $Gait Training: 23-37 mins                    Chesley Noon, PTA 03/12/21, 10:54 AM

## 2021-03-12 NOTE — Progress Notes (Signed)
Patient's blood sugar is 444, Dr. Jimmye Norman notified and ordered to move forward with 15 units of Novolog.  Will continue to monitor.

## 2021-03-12 NOTE — NC FL2 (Signed)
[place] LEVEL OF CARE SCREENING TOOL     IDENTIFICATION  Patient Name: [name] [name] Birthdate: September 24, 1941 Sex: female Admission Date (Current Location): 03/08/2021  [place] and [place] Number:  Engineering geologist and Address:  [facility], [address], [place], [place] [address]      Provider Number: [id number]  Attending Physician Name and Address:  Wyvonnia Dusky, MD  Relative Name and Phone Number:  Karna Christmas- [phone]    Current Level of Care: Hospital Recommended Level of Care: [place] Prior Approval Number:    Date Approved/Denied:   PASRR Number: [id number]  Discharge Plan:      Current Diagnoses: Patient Active Problem List   Diagnosis Date Noted  . DKA (diabetic ketoacidosis) ([place]) 03/08/2021  . HTN (hypertension) 03/08/2021  . Acute metabolic encephalopathy 85/63/1497  . SIRS (systemic inflammatory response syndrome) ([place]) 03/08/2021  . Hyperkalemia 03/08/2021  . Lactic acid acidosis 03/08/2021  . Nausea & vomiting 03/08/2021  . NSVT (nonsustained ventricular tachycardia) ([place]) 03/08/2021  . NSTEMI (non-ST elevated myocardial infarction) ([place]) 03/08/2021  . Erroneous encounter - disregard 10/09/2019  . Pain due to onychomycosis of toenails of both feet 05/14/2019  . Porokeratosis 05/14/2019  . Encephalopathy acute 11/22/2017  . Pneumonia, community acquired 11/22/2017  . Pseudogout 11/22/2017  . Protein-calorie malnutrition, severe 11/14/2017  . AKI (acute kidney injury) ([place]) 11/13/2017  . DKA (diabetic ketoacidoses) 07/31/2017  . Hyponatremia 06/18/2016  . Osteoporosis 04/23/2016  . Breast cancer ([place]) 03/30/2016  . Hyperlipemia 03/30/2016  . BP (high blood pressure) 03/30/2016  . Hypothyroidism 03/30/2016  . Type 1 diabetes mellitus with hypoglycemia and without coma ([place]) 02/10/2016  . Type 1 diabetes mellitus without complication ([place]) 02/63/7858  . Dermatophytosis of nail  08/24/2013  . Pain in lower limb 08/24/2013    Orientation RESPIRATION BLADDER Height & Weight     Self,Time,Situation  Normal Incontinent,External catheter Weight: 120 lb (54.4 kg) Height:  5\' 4"  (162.6 cm)  BEHAVIORAL SYMPTOMS/MOOD NEUROLOGICAL BOWEL NUTRITION STATUS      Continent Diet (heart healthy/carb modified; thin liquids)  AMBULATORY STATUS COMMUNICATION OF NEEDS Skin   Limited Assist Verbally Normal                       Personal Care Assistance Level of Assistance  Bathing,Feeding,Dressing Bathing Assistance: Limited assistance Feeding assistance: Independent Dressing Assistance: Limited assistance     Functional Limitations Info             SPECIAL CARE FACTORS FREQUENCY  PT (By licensed PT),OT (By licensed OT)     PT Frequency: 5 x/week OT Frequency: 5 x/week            Contractures      Additional Factors Info  Code Status,Allergies Code Status Info: DNR Allergies Info: lisinopril, sulfa antibiotics, sulfasalazine           Current Medications (03/12/2021):  This is the current hospital active medication list Current Facility-Administered Medications  Medication Dose Route Frequency Provider Last Rate Last Admin  . acetaminophen (TYLENOL) suppository 650 mg  650 mg Rectal Q6H PRN Ivor Costa, MD      . acetaminophen (TYLENOL) tablet 650 mg  650 mg Oral Q6H PRN Ivor Costa, MD      . aspirin EC tablet 81 mg  81 mg Oral Daily Ivor Costa, MD   81 mg at 03/12/21 1020  . atorvastatin (LIPITOR) tablet 80 mg  80 mg Oral Daily Niu,  Soledad Gerlach, MD   80 mg at 03/12/21 1020  . calcium-vitamin D (OSCAL WITH D) 500-200 MG-UNIT per tablet 1 tablet  1 tablet Oral Daily Ivor Costa, MD   1 tablet at 03/12/21 1020  . Chlorhexidine Gluconate Cloth 2 % PADS 6 each  6 each Topical Daily Ivor Costa, MD   6 each at 03/12/21 1022  . dextrose 50 % solution 0-50 mL  0-50 mL Intravenous PRN Nance Pear, MD      . dextrose 50 % solution 25 mL  25 mL Intravenous Once  Sharion Settler, NP      . enoxaparin (LOVENOX) injection 40 mg  40 mg Subcutaneous Q24H Benita Gutter, RPH   40 mg at 03/11/21 2108  . folic acid (FOLVITE) tablet 1 mg  1 mg Oral Daily Wyvonnia Dusky, MD   1 mg at 03/12/21 1020  . insulin aspart (novoLOG) injection 0-15 Units  0-15 Units Subcutaneous TID AC & HS Sharion Settler, NP   5 Units at 03/12/21 1022  . insulin glargine (LANTUS) injection 10 Units  10 Units Subcutaneous QHS Sharion Settler, NP   10 Units at 03/10/21 2234  . levothyroxine (SYNTHROID) tablet 25 mcg  25 mcg Oral Q0600 Ivor Costa, MD   25 mcg at 03/12/21 0511  . metoprolol tartrate (LOPRESSOR) tablet 12.5 mg  12.5 mg Oral BID Callwood, Dwayne D, MD   12.5 mg at 03/12/21 1020  . ondansetron (ZOFRAN) injection 4 mg  4 mg Intravenous Q8H PRN Ivor Costa, MD   4 mg at 03/08/21 1545  . tamoxifen (NOLVADEX) tablet 20 mg  20 mg Oral Daily Ivor Costa, MD   20 mg at 03/12/21 1021   Facility-Administered Medications Ordered in Other Encounters  Medication Dose Route Frequency Provider Last Rate Last Admin  . denosumab (PROLIA) injection 60 mg  60 mg Subcutaneous Q6 months Sindy Guadeloupe, MD   60 mg at 02/21/21 1111     Discharge Medications: Please see discharge summary for a list of discharge medications.  Relevant Imaging Results:  Relevant Lab Results:   Additional Information SS#: [place], LCSW

## 2021-03-13 LAB — BASIC METABOLIC PANEL
Anion gap: 8 (ref 5–15)
BUN: 21 mg/dL (ref 8–23)
CO2: 28 mmol/L (ref 22–32)
Calcium: 8.1 mg/dL — ABNORMAL LOW (ref 8.9–10.3)
Chloride: 97 mmol/L — ABNORMAL LOW (ref 98–111)
Creatinine, Ser: 0.74 mg/dL (ref 0.44–1.00)
GFR, Estimated: >60 mL/min (ref >60)
Glucose, Bld: 78 mg/dL (ref 70–99)
Potassium: 3.7 mmol/L (ref 3.5–5.1)
Sodium: 133 mmol/L — ABNORMAL LOW (ref 135–145)

## 2021-03-13 LAB — CBC
HCT: 39.1 % (ref 36.0–46.0)
Hemoglobin: 13.4 g/dL (ref 12.0–15.0)
MCH: 32.9 pg (ref 26.0–34.0)
MCHC: 34.3 g/dL (ref 30.0–36.0)
MCV: 96.1 fL (ref 80.0–100.0)
Platelets: 161 10*3/uL (ref 150–400)
RBC: 4.07 MIL/uL (ref 3.87–5.11)
RDW: 13.6 % (ref 11.5–15.5)
WBC: 10 10*3/uL (ref 4.0–10.5)
nRBC: 0 % (ref 0.0–0.2)

## 2021-03-13 LAB — CULTURE, BLOOD (ROUTINE X 2)
Culture: NO GROWTH
Culture: NO GROWTH
Special Requests: ADEQUATE
Special Requests: ADEQUATE

## 2021-03-13 LAB — GLUCOSE, CAPILLARY
Glucose-Capillary: 54 mg/dL — ABNORMAL LOW (ref 70–99)
Glucose-Capillary: 120 mg/dL — ABNORMAL HIGH (ref 70–99)
Glucose-Capillary: 254 mg/dL — ABNORMAL HIGH (ref 70–99)
Glucose-Capillary: 137 mg/dL — ABNORMAL HIGH (ref 70–99)
Glucose-Capillary: 138 mg/dL — ABNORMAL HIGH (ref 70–99)
Glucose-Capillary: 69 mg/dL — ABNORMAL LOW (ref 70–99)

## 2021-03-13 MED ORDER — INSULIN ASPART 100 UNIT/ML ~~LOC~~ SOLN: 0.0000 [IU] | Freq: Every day | SUBCUTANEOUS | Status: DC

## 2021-03-13 MED ORDER — DEXTROSE 50 % IV SOLN
INTRAVENOUS | Status: AC
  Filled 2021-03-13: qty 50

## 2021-03-13 MED ORDER — ENALAPRIL MALEATE 20 MG PO TABS
20.0000 mg | ORAL_TABLET | Freq: Every day | ORAL | Status: DC
  Administered 2021-03-13 – 2021-03-14 (×2): 20 mg via ORAL
  Filled 2021-03-13 (×2): qty 1

## 2021-03-13 MED ORDER — INSULIN ASPART 100 UNIT/ML ~~LOC~~ SOLN
0.0000 [IU] | Freq: Three times a day (TID) | SUBCUTANEOUS | Status: DC
  Administered 2021-03-13: 5 [IU] via SUBCUTANEOUS
  Administered 2021-03-13: 1 [IU] via SUBCUTANEOUS
  Administered 2021-03-14: 5 [IU] via SUBCUTANEOUS
  Filled 2021-03-13 (×3): qty 1

## 2021-03-13 MED ORDER — INSULIN GLARGINE 100 UNIT/ML ~~LOC~~ SOLN
6.0000 [IU] | Freq: Every day | SUBCUTANEOUS | Status: DC
  Filled 2021-03-13 (×2): qty 0.06

## 2021-03-13 MED ORDER — INSULIN ASPART 100 UNIT/ML ~~LOC~~ SOLN
2.0000 [IU] | Freq: Three times a day (TID) | SUBCUTANEOUS | Status: DC
  Administered 2021-03-13 – 2021-03-14 (×3): 2 [IU] via SUBCUTANEOUS
  Filled 2021-03-13 (×4): qty 1

## 2021-03-13 NOTE — Progress Notes (Signed)
PROGRESS NOTE    [name] [name]  QIO:[id number] DOB: 08/29/1941 DOA: 03/08/2021 PCP: Tracie Harrier, MD    Assessment & Plan:   Principal Problem:   DKA (diabetic ketoacidosis) ([place]) Active Problems:   Breast cancer ([place])   Hyperlipemia   Hypothyroidism   Type 1 diabetes mellitus without complication ([place])   AKI (acute kidney injury) ([place])   HTN (hypertension)   Acute metabolic encephalopathy   SIRS (systemic inflammatory response syndrome) ([facility])   Hyperkalemia   Nausea & vomiting   NSVT (nonsustained ventricular tachycardia) ([facility])   NSTEMI (non-ST elevated myocardial infarction) ([place])  SIRS: meets criteria w/ leukocytosis, tachycardic, tachypnea, & elevated lactic acid & no source of infection identified.  UA is neg. CXR is neg. Urinalysis negative. Procalcitonin 0.58. Blood cxs NGTD. Urine cx shows no growth. Resolved   NSTEMI: d/c IV heparin as per cardio. Not currently a candidate for cardiac cath as per cardio. Continue on tele. Cardio is following and recs apprec  Hyperkalemia: resolved  Hypokalemia: within normal limits today   Thrombocytopenia: resolved   Acute metabolic encephalopathy: labile. Likely secondary to DKA. CT head shows no acute intracranial findings   Leukocytosis: resolved   Macrocytic anemia: likely secondary to folic acid deficiency. Continue on folic acid supplement   History of breast cancer: continue on home dose of tamoxifen. S/p right mastectomy   HLD: continue on statin   Hypothyroidism: continue on home dose of synthroid   DM1: w/ DKA on admission which has resolved. Poorly controlled w/ HbA1c 8.1. Continue on lantus, SSI w/ accuchecks   AKI: resolved    HTN: will restart home dose of enalapril   DVT prophylaxis: lovenox  Code Status: full  Family Communication:  Disposition Plan: d/c to SNF, CM is working on this still  Level of care: Med-Surg   Status is: Inpatient  Remains inpatient appropriate because:Ongoing  diagnostic testing needed not appropriate for outpatient work up, Unsafe d/c plan, IV treatments appropriate due to intensity of illness or inability to take PO and Inpatient level of care appropriate due to severity of illness, therapy is now recommending SNF, CM is working on this    Dispo: The patient is from: Home              Anticipated d/c is to: SNF              Patient currently: is medically stable    Difficult to place patient: unclear   Consultants:   Cardio    Procedures:   Antimicrobials:    Subjective: Pt c/o fatigue   Objective: Vitals:   03/12/21 1556 03/12/21 2149 03/12/21 2349 03/13/21 0425  BP: (!) 113/56 131/68 126/73 138/75  Pulse: 75 80 83 76  Resp: 16 16 20 16   Temp: 98.4 F (36.9 C) 98.6 F (37 C) 98.3 F (36.8 C) 97.7 F (36.5 C)  TempSrc: Oral Oral Oral   SpO2: 100% 96% 97% 99%  Weight:      Height:        Intake/Output Summary (Last 24 hours) at 03/13/2021 0728 Last data filed at 03/12/2021 2149 Gross per 24 hour  Intake 720 ml  Output 400 ml  Net 320 ml   Filed Weights   03/08/21 0805  Weight: 54.4 kg    Examination:  General exam: Appears comfortable  Respiratory system: clear breath sounds b/l  Cardiovascular system: S1/S2+. No clicks or rubs  Gastrointestinal system: Abd is soft, NT,ND & hypoactive bowel sounds  Central nervous  system: Alert and awake. Moves all extremities  Psychiatry: Judgement and insight appear abnormal. Flat mood and affect    Data Reviewed: I have personally reviewed following labs and imaging studies  CBC: Recent Labs  Lab 03/08/21 0813 03/09/21 0242 03/10/21 0519 03/11/21 0350 03/12/21 0350 03/13/21 0318  WBC 22.3* 20.8* 12.2* 9.7 9.3 10.0  NEUTROABS 17.1*  --   --   --   --   --   HGB 10.8* 10.5* 10.6* 11.7* 12.6 13.4  HCT 35.1* 30.4* 31.9* 34.5* 36.6 39.1  MCV 108.7* 95.9 97.9 96.6 95.8 96.1  PLT 241 192 145* 166 150 948   Basic Metabolic Panel: Recent Labs  Lab 03/08/21 1432  03/08/21 1839 03/09/21 0242 03/10/21 0519 03/11/21 0350 03/12/21 0350 03/13/21 0318  NA 135   < > 136 137 139 133* 133*  K 4.2   < > 3.5 3.4* 3.2* 4.5 3.7  CL 102   < > 107 107 106 99 97*  CO2 11*   < > 22 21* 25 25 28   GLUCOSE 561*   < > 156* 252* 39* 344* 78  BUN 36*   < > 33* 25* 23 18 21   CREATININE 2.10*   < > 1.42* 0.94 0.74 0.70 0.74  CALCIUM 8.0*   < > 8.1* 8.1* 8.3* 8.1* 8.1*  MG 2.0  --   --   --   --   --   --    < > = values in this interval not displayed.   GFR: Estimated Creatinine Clearance: 46.6 mL/min (by C-G formula based on SCr of 0.74 mg/dL). Liver Function Tests: Recent Labs  Lab 03/08/21 0813  AST 36  ALT 17  ALKPHOS 40  BILITOT 2.1*  PROT 6.3*  ALBUMIN 4.0   Recent Labs  Lab 03/08/21 1055  LIPASE 74*   No results for input(s): AMMONIA in the last 168 hours. Coagulation Profile: No results for input(s): INR, PROTIME in the last 168 hours. Cardiac Enzymes: No results for input(s): CKTOTAL, CKMB, CKMBINDEX, TROPONINI in the last 168 hours. BNP (last 3 results) No results for input(s): PROBNP in the last 8760 hours. HbA1C: No results for input(s): HGBA1C in the last 72 hours. CBG: Recent Labs  Lab 03/11/21 2223 03/12/21 0743 03/12/21 1128 03/12/21 1656 03/12/21 2150  GLUCAP 163* 386* 444* 101* 270*   Lipid Profile: No results for input(s): CHOL, HDL, LDLCALC, TRIG, CHOLHDL, LDLDIRECT in the last 72 hours. Thyroid Function Tests: No results for input(s): TSH, T4TOTAL, FREET4, T3FREE, THYROIDAB in the last 72 hours. Anemia Panel: Recent Labs    03/11/21 0350  VITAMINB12 312  FOLATE 5.0*   Sepsis Labs: Recent Labs  Lab 03/08/21 1055 03/08/21 1205 03/08/21 1432 03/08/21 2003 03/08/21 2257  PROCALCITON 5.86  --   --   --   --   LATICACIDVEN  --  7.1* 6.4* 3.2* 2.4*    Recent Results (from the past 240 hour(s))  Blood Culture (routine x 2)     Status: None   Collection Time: 03/08/21  8:13 AM   Specimen: BLOOD  Result  Value Ref Range Status   Specimen Description BLOOD BLOOD RIGHT FOREARM  Final   Special Requests   Final    BOTTLES DRAWN AEROBIC AND ANAEROBIC Blood Culture adequate volume   Culture   Final    NO GROWTH 5 DAYS Performed at [facility], [address], [place], [place] [address]    Report Status 03/13/2021 FINAL  Final  Blood Culture (routine x 2)     Status: None   Collection Time: 03/08/21  8:13 AM   Specimen: BLOOD  Result Value Ref Range Status   Specimen Description BLOOD BLOOD RIGHT HAND  Final   Special Requests   Final    BOTTLES DRAWN AEROBIC AND ANAEROBIC Blood Culture adequate volume   Culture   Final    NO GROWTH 5 DAYS Performed at [facility], [address] [place], [place] [address]    Report Status 03/13/2021 FINAL  Final  Resp Panel by RT-PCR (Flu A&B, Covid) Nasopharyngeal Swab     Status: None   Collection Time: 03/08/21  8:13 AM   Specimen: Nasopharyngeal Swab; Nasopharyngeal(NP) swabs in vial transport medium  Result Value Ref Range Status   SARS Coronavirus 2 by RT PCR NEGATIVE NEGATIVE Final    Comment: (NOTE) SARS-CoV-2 target nucleic acids are NOT DETECTED.  The SARS-CoV-2 RNA is generally detectable in upper respiratory specimens during the acute phase of infection. The lowest concentration of SARS-CoV-2 viral copies this assay can detect is 138 copies/mL. A negative result does not preclude SARS-Cov-2 infection and should not be used as the sole basis for treatment or other patient management decisions. A negative result may occur with  improper specimen collection/handling, submission of specimen other than nasopharyngeal swab, presence of viral mutation(s) within the areas targeted by this assay, and inadequate number of viral copies(<138 copies/mL). A negative result must be combined with clinical observations, patient history, and epidemiological information. The expected result is Negative.  Fact Sheet for Patients:   EntrepreneurPulse.com.au  Fact Sheet for Healthcare Providers:  IncredibleEmployment.be  This test is no t yet approved or cleared by the [place] FDA and  has been authorized for detection and/or diagnosis of SARS-CoV-2 by FDA under an Emergency Use Authorization (EUA). This EUA will remain  in effect (meaning this test can be used) for the duration of the COVID-19 declaration under Section 564(b)(1) of the Act, 21 U.S.C.section 360bbb-3(b)(1), unless the authorization is terminated  or revoked sooner.       Influenza A by PCR NEGATIVE NEGATIVE Final   Influenza B by PCR NEGATIVE NEGATIVE Final    Comment: (NOTE) The Xpert Xpress SARS-CoV-2/FLU/RSV plus assay is intended as an aid in the diagnosis of influenza from Nasopharyngeal swab specimens and should not be used as a sole basis for treatment. Nasal washings and aspirates are unacceptable for Xpert Xpress SARS-CoV-2/FLU/RSV testing.  Fact Sheet for Patients: EntrepreneurPulse.com.au  Fact Sheet for Healthcare Providers: IncredibleEmployment.be  This test is not yet approved or cleared by the [place] FDA and has been authorized for detection and/or diagnosis of SARS-CoV-2 by FDA under an Emergency Use Authorization (EUA). This EUA will remain in effect (meaning this test can be used) for the duration of the COVID-19 declaration under Section 564(b)(1) of the Act, 21 U.S.C. section 360bbb-3(b)(1), unless the authorization is terminated or revoked.  Performed at [facility], [address], [place], [place] [address]   Urine culture     Status: None   Collection Time: 03/08/21 11:00 AM   Specimen: In/Out Cath Urine  Result Value Ref Range Status   Specimen Description   Final    IN/OUT CATH URINE Performed at [facility], [address], [place], [place] [address]    Special Requests   Final    NONE Performed  at [facility], [address] [place], [place] [address]    Culture  Final    NO GROWTH Performed at [facility], [place] [address], [place], [place] [address]    Report Status 03/09/2021 FINAL  Final  MRSA PCR Screening     Status: None   Collection Time: 03/08/21 12:05 PM   Specimen: Nasopharyngeal  Result Value Ref Range Status   MRSA by PCR NEGATIVE NEGATIVE Final    Comment:        The GeneXpert MRSA Assay (FDA approved for NASAL specimens only), is one component of a comprehensive MRSA colonization surveillance program. It is not intended to diagnose MRSA infection nor to guide or monitor treatment for MRSA infections. Performed at [facility], [address] [place], [place] [address]          Radiology Studies: No results found.      Scheduled Meds: . aspirin EC  81 mg Oral Daily  . atorvastatin  80 mg Oral Daily  . calcium-vitamin D  1 tablet Oral Daily  . Chlorhexidine Gluconate Cloth  6 each Topical Daily  . dextrose  25 mL Intravenous Once  . enoxaparin (LOVENOX) injection  40 mg Subcutaneous Q24H  . folic acid  1 mg Oral Daily  . insulin aspart  0-15 Units Subcutaneous TID AC & HS  . insulin glargine  10 Units Subcutaneous QHS  . levothyroxine  25 mcg Oral Q0600  . metoprolol tartrate  12.5 mg Oral BID  . tamoxifen  20 mg Oral Daily   Continuous Infusions:    LOS: 5 days    Time spent: 30 mins     Wyvonnia Dusky, MD [facility] Pager [phone]  If 7PM-7AM, please contact night-coverage 03/13/2021, 7:28 AM

## 2021-03-13 NOTE — Progress Notes (Signed)
Inpatient Diabetes Program Recommendations  AACE/ADA: New Consensus Statement on Inpatient Glycemic Control   Target Ranges:  Prepandial:   less than 140 mg/dL      Peak postprandial:   less than 180 mg/dL (1-2 hours)      Critically ill patients:  140 - 180 mg/dL   Results for [name] [name], [name] [name] (MRN [id number]) as of 03/13/2021 10:20  Ref. Range 03/12/2021 07:43 03/12/2021 11:28 03/12/2021 16:56 03/12/2021 21:50 03/13/2021 07:53 03/13/2021 08:36  Glucose-Capillary Latest Ref Range: 70 - 99 mg/dL 386 (H) 444 (H) 101 (H) 270 (H) 54 (L) 120 (H)  Results for [name] [name], [name] [name] (MRN [id number]) as of 03/13/2021 10:20  Ref. Range 03/11/2021 06:12 03/11/2021 06:36 03/11/2021 07:11 03/11/2021 08:13 03/11/2021 12:03 03/11/2021 16:36 03/11/2021 20:57 03/11/2021 21:15 03/11/2021 21:42 03/11/2021 22:23  Glucose-Capillary Latest Ref Range: 70 - 99 mg/dL 51 (L) 70 93 154 (H) 269 (H) 144 (H) 61 (L) 59 (L) 87 163 (H)   Review of Glycemic Control  Diabetes history: DM1  Outpatient Diabetes medications: Lantus 10 units daily, Novolog 2-5 units TID with meals Current orders for Inpatient glycemic control: Lantus 10 units QHS, Novolog 0-15 units AC&HS  Inpatient Diabetes Program Recommendations:    Insulin: Please consider decreasing Lantus to 6 units daily, ordering Novolog 2 units TID with meals if patient eats at least 50% of meals, and decreasing Novolog correction to Novolog 0-9 units TID with meals and Novolog 0-5 units QHS.  NOTE: Noted patient did not receive any Lantus on 03/11/21 due to hypoglycemia at bedtime. As a result of not getting any basal insulin, glucose 386 mg/dl at 7:43 am on 03/12/21. Noted patient received Lantus 10 units last night and fasting glucose 54 mg/dl today. Would recommend decreasing Lantus to 6 units daily, Novolog correction to 0-9 units TID with meals, Novolog 0-5 units QHS, and Novolog 2 units TID with meals if patient is eating at least 50% of meals.  Thanks, Barnie Alderman, RN, MSN, CDE Diabetes  Coordinator Inpatient Diabetes Program [phone] (Team Pager from 8am to 5pm)

## 2021-03-13 NOTE — Progress Notes (Signed)
[facility] Cardiology  Patient Description: Mrs. Wiemann is a 80 year old female with PMH significant for hypertension, hyperlipidemia, DM type I, h/o DKA, breast cancer s/p right mastectomy and hypothyroidism who was admitted due to altered mental status and a subsequent diagnosis of DKA.  Cardiology was consulted due to elevated troponin.  SUBJECTIVE: The patient reports to be doing well on today and she denies having any cardiac symptoms at this time. Her husband is at the bedside and states that they are awaiting facility placement for discharge.   03/10/2021: The patient states to be doing well on today and she denies having any chest pain, dyspnea, peripheral edema, palpitations or dizziness at this time. She states that she is feeling well and repeatedly expresses gratitude for her husband being at the beside with her. The husband expresses concerns that the patient's speech is "slower" than usual.   OBJECTIVE: The patient appears well on today and is sitting comfortably in bed eating lunch. She continues to deny having any chest pain, dyspnea, palpitations, peripheral edema or syncope at this time. The patient is AOx4 on today. The patient's echocardiogram results and plan for cardiac care was explained to both the patient and her husband and they each verbalized understanding of all information.  The patient's vital signs are stable and she is in no apparent distress.  03/10/2021: The patient appears fairly well on today, she is AOx3, her vss and she is in no apparent distress. The patient's bedside telemetry monitor reveals sinus rhythm with occasional PVCs. The patient does not appear to have any unilateral weakness, slurred speech, facial drooping or any other strokelike symptoms at this time.   Vitals:   03/12/21 2349 03/13/21 0425 03/13/21 1100 03/13/21 1619  BP: 126/73 138/75 128/67 128/68  Pulse: 83 76 74 73  Resp: 20 16 16 16   Temp: 98.3 F (36.8 C) 97.7 F (36.5 C) 97.6 F (36.4 C) 97.6 F  (36.4 C)  TempSrc: Oral  Oral Oral  SpO2: 97% 99% 99% 99%  Weight:      Height:         Intake/Output Summary (Last 24 hours) at 03/13/2021 1716 Last data filed at 03/13/2021 1448 Gross per 24 hour  Intake 480 ml  Output 700 ml  Net -220 ml      PHYSICAL EXAM  General: Well developed, well nourished, in no acute distress HEENT:  Normocephalic and atraumatic, PERRL Neck:  No JVD.  Lungs: Clear bilaterally to auscultation, normal effort of breathing, chest expansion symmetrical, negative for wheezes, rales or rhonchi. Heart: HRRR . Normal S1 and S2 without gallops or murmurs.  Abdomen: Bowel sounds are positive, abdomen soft and non-tender  Msk:  Normal strength and tone for age. Extremities:  No clubbing, cyanosis or edema. Arthritic deformities to phalanges bilaterally.  Neuro: Alert and oriented X 3. Psych:  Good affect, responds appropriately   LABS: Basic Metabolic Panel: Recent Labs    03/12/21 0350 03/13/21 0318  NA 133* 133*  K 4.5 3.7  CL 99 97*  CO2 25 28  GLUCOSE 344* 78  BUN 18 21  CREATININE 0.70 0.74  CALCIUM 8.1* 8.1*   Liver Function Tests: No results for input(s): AST, ALT, ALKPHOS, BILITOT, PROT, ALBUMIN in the last 72 hours. No results for input(s): LIPASE, AMYLASE in the last 72 hours. CBC: Recent Labs    03/12/21 0350 03/13/21 0318  WBC 9.3 10.0  HGB 12.6 13.4  HCT 36.6 39.1  MCV 95.8 96.1  PLT 150 161  Cardiac Enzymes: No results for input(s): CKTOTAL, CKMB, CKMBINDEX, TROPONINI in the last 72 hours. BNP: Invalid input(s): POCBNP D-Dimer: No results for input(s): DDIMER in the last 72 hours. Hemoglobin A1C: No results for input(s): HGBA1C in the last 72 hours. Fasting Lipid Panel: No results for input(s): CHOL, HDL, LDLCALC, TRIG, CHOLHDL, LDLDIRECT in the last 72 hours. Thyroid Function Tests: No results for input(s): TSH, T4TOTAL, T3FREE, THYROIDAB in the last 72 hours.  Invalid input(s): FREET3 Anemia Panel: Recent  Labs    03/11/21 0350  VITAMINB12 312  FOLATE 5.0*    No results found.   Echo: pending   TELEMETRY: SR with occasional PVC's   ASSESSMENT AND PLAN:  Principal Problem:   DKA (diabetic ketoacidosis) ([place]) Active Problems:   Breast cancer ([place])   Hyperlipemia   Hypothyroidism   Type 1 diabetes mellitus without complication ([place])   AKI (acute kidney injury) ([place])   HTN (hypertension)   Acute metabolic encephalopathy   SIRS (systemic inflammatory response syndrome) (HCC)   Hyperkalemia   Nausea & vomiting   NSVT (nonsustained ventricular tachycardia) (HCC)   NSTEMI (non-ST elevated myocardial infarction) ([place])    1. Elevated troponin at peak of >27,000, likely due to demand ischemia in the presence of AKI, DKA and sepsis, the patient is not symptomatic and does not have any chest pain or ECG changes consistent with ischemia  -Patient is not a candidate for urgent cardiac catheterization. However, this can be considered as an outpatient once the patient is hemodynamically stable as this current elevated troponin does not appears to be related to ACS.  -Recommending correcting underlying electrolyte imbalances.   -Echocardiogram reveals borderline low EF estimated at 50-55%, inferior hypokinesis and mild to moderate mitral valve regurgitation.  -Recommend continuing metoprolol therapy.   -Agree with aspirin therapy at this time.    2. DKA in the presence of DM type I, progressively improving, blood glucose on today was 252  -Agree with current management plan.  3. Sepsis, fairly stable, patient has been transferred from the ICU to the stepdown unit  -Agree with empiric antibiotic therapy.  5. HLD  -Agree with statin therapy.  -Recommend a heart healthy diet.  6. Hypotension in the presence of sepsis, fairly stable, patient is normotensive at this time  -Recommend oral hydration.    -- Cardiology will sign off at this point. Please re-consult if needed.--     Gladstone Pih, ACNPC-AG  03/13/2021 5:16 PM

## 2021-03-13 NOTE — TOC Progression Note (Addendum)
Transition of Care [facility]) - Progression Note    Patient Details  Name: [name] [name] MRN: [id number] Date of Birth: 08/23/1941  Transition of Care [facility]) CM/SW Jeannette, LCSW Phone Number: 03/13/2021, 12:02 PM  Clinical Narrative: Called daughter. First preference SNF is [facility], second is [facility]. Both bed offer decisions are still pending. Tiffany at [facility] will review referral and follow up with decision.    1:30 pm: [facility] is able to offer a bed. Called [employer] and started insurance authorization. Daughter is aware.  3:34 pm: SNF auth approved: [id number]. HTA does not think EMS authorization will be approved. Left message for SNF admissions coordinator to see if daughter could transport if she felt comfortable doing so.   4:16 pm: [facility] okay with family transporting and she can get out of the car and into the wheelchair on her own. Per RN, patient will need assistance. Discussed with daughter. She is okay with transporting and will try to bring someone to assist her.  Expected Discharge Plan: [place] Barriers to Discharge: Barriers Resolved  Expected Discharge Plan and Services Expected Discharge Plan: [place] arrangements for the past 2 months: Single Family Home Expected Discharge Date: 03/11/21               DME Arranged: Gilford Rile rolling,3-N-1 DME Agency:  Celesta Aver) Date DME Agency Contacted: 03/11/21   Representative spoke with at DME Agency: Brenton Grills HH Arranged: PT,OT,RN [place]: [place] ([place]) Date [place]: 03/11/21   Representative spoke with at [place]: Floydene Flock   Social Determinants of Health (SDOH) Interventions    Readmission Risk Interventions No flowsheet data found.

## 2021-03-13 NOTE — Progress Notes (Signed)
Hypoglycemic Event  CBG: 54  Treatment: 1 container of applejuice  Symptoms: clammy  Follow-up CBG: [id number] CBG Result:120  Possible Reasons for Event: patient had not eaten since yesterday  Comments/MD notified:protocol followed    Haynes Dage

## 2021-03-13 NOTE — Progress Notes (Signed)
Patient assisted to the chair. 1 assist required to ambulate the patient from the bed to the chair. Patient was weak but able to carry her own weight

## 2021-03-13 NOTE — Care Management Important Message (Signed)
Important Message  Patient Details  Name: [name] [name] MRN: [id number] Date of Birth: 1941/09/02   Medicare Important Message Given:  Yes     Dannette Barbara 03/13/2021, 11:09 AM

## 2021-03-14 DIAGNOSIS — E109 Type 1 diabetes mellitus without complications: Secondary | ICD-10-CM | POA: Diagnosis not present

## 2021-03-14 DIAGNOSIS — Z7982 Long term (current) use of aspirin: Secondary | ICD-10-CM | POA: Diagnosis not present

## 2021-03-14 DIAGNOSIS — M109 Gout, unspecified: Secondary | ICD-10-CM | POA: Diagnosis not present

## 2021-03-14 DIAGNOSIS — E785 Hyperlipidemia, unspecified: Secondary | ICD-10-CM | POA: Diagnosis not present

## 2021-03-14 DIAGNOSIS — E875 Hyperkalemia: Secondary | ICD-10-CM | POA: Diagnosis not present

## 2021-03-14 DIAGNOSIS — I472 Ventricular tachycardia: Secondary | ICD-10-CM | POA: Diagnosis not present

## 2021-03-14 DIAGNOSIS — I482 Chronic atrial fibrillation, unspecified: Secondary | ICD-10-CM | POA: Diagnosis not present

## 2021-03-14 DIAGNOSIS — E1010 Type 1 diabetes mellitus with ketoacidosis without coma: Secondary | ICD-10-CM | POA: Diagnosis not present

## 2021-03-14 DIAGNOSIS — Z7401 Bed confinement status: Secondary | ICD-10-CM | POA: Diagnosis not present

## 2021-03-14 DIAGNOSIS — R030 Elevated blood-pressure reading, without diagnosis of hypertension: Secondary | ICD-10-CM | POA: Diagnosis not present

## 2021-03-14 DIAGNOSIS — E10649 Type 1 diabetes mellitus with hypoglycemia without coma: Secondary | ICD-10-CM | POA: Diagnosis not present

## 2021-03-14 DIAGNOSIS — E039 Hypothyroidism, unspecified: Secondary | ICD-10-CM | POA: Diagnosis not present

## 2021-03-14 DIAGNOSIS — I214 Non-ST elevation (NSTEMI) myocardial infarction: Secondary | ICD-10-CM | POA: Diagnosis not present

## 2021-03-14 DIAGNOSIS — Z794 Long term (current) use of insulin: Secondary | ICD-10-CM | POA: Diagnosis not present

## 2021-03-14 DIAGNOSIS — E871 Hypo-osmolality and hyponatremia: Secondary | ICD-10-CM | POA: Diagnosis not present

## 2021-03-14 DIAGNOSIS — R404 Transient alteration of awareness: Secondary | ICD-10-CM | POA: Diagnosis not present

## 2021-03-14 DIAGNOSIS — Z853 Personal history of malignant neoplasm of breast: Secondary | ICD-10-CM | POA: Diagnosis not present

## 2021-03-14 DIAGNOSIS — E559 Vitamin D deficiency, unspecified: Secondary | ICD-10-CM | POA: Diagnosis not present

## 2021-03-14 DIAGNOSIS — R739 Hyperglycemia, unspecified: Secondary | ICD-10-CM | POA: Diagnosis not present

## 2021-03-14 DIAGNOSIS — R410 Disorientation, unspecified: Secondary | ICD-10-CM | POA: Diagnosis not present

## 2021-03-14 DIAGNOSIS — E43 Unspecified severe protein-calorie malnutrition: Secondary | ICD-10-CM | POA: Diagnosis not present

## 2021-03-14 DIAGNOSIS — M810 Age-related osteoporosis without current pathological fracture: Secondary | ICD-10-CM | POA: Diagnosis not present

## 2021-03-14 DIAGNOSIS — Z9011 Acquired absence of right breast and nipple: Secondary | ICD-10-CM | POA: Diagnosis not present

## 2021-03-14 DIAGNOSIS — I25118 Atherosclerotic heart disease of native coronary artery with other forms of angina pectoris: Secondary | ICD-10-CM | POA: Diagnosis not present

## 2021-03-14 DIAGNOSIS — R6510 Systemic inflammatory response syndrome (SIRS) of non-infectious origin without acute organ dysfunction: Secondary | ICD-10-CM | POA: Diagnosis not present

## 2021-03-14 DIAGNOSIS — M2550 Pain in unspecified joint: Secondary | ICD-10-CM | POA: Diagnosis not present

## 2021-03-14 DIAGNOSIS — G9341 Metabolic encephalopathy: Secondary | ICD-10-CM | POA: Diagnosis not present

## 2021-03-14 DIAGNOSIS — E861 Hypovolemia: Secondary | ICD-10-CM | POA: Diagnosis not present

## 2021-03-14 DIAGNOSIS — I10 Essential (primary) hypertension: Secondary | ICD-10-CM | POA: Diagnosis not present

## 2021-03-14 DIAGNOSIS — E440 Moderate protein-calorie malnutrition: Secondary | ICD-10-CM | POA: Diagnosis not present

## 2021-03-14 DIAGNOSIS — D539 Nutritional anemia, unspecified: Secondary | ICD-10-CM | POA: Diagnosis not present

## 2021-03-14 LAB — BASIC METABOLIC PANEL
Anion gap: 9 (ref 5–15)
BUN: 17 mg/dL (ref 8–23)
CO2: 26 mmol/L (ref 22–32)
Calcium: 7.8 mg/dL — ABNORMAL LOW (ref 8.9–10.3)
Chloride: 97 mmol/L — ABNORMAL LOW (ref 98–111)
Creatinine, Ser: 0.68 mg/dL (ref 0.44–1.00)
GFR, Estimated: >60 mL/min (ref >60)
Glucose, Bld: 247 mg/dL — ABNORMAL HIGH (ref 70–99)
Potassium: 4.1 mmol/L (ref 3.5–5.1)
Sodium: 132 mmol/L — ABNORMAL LOW (ref 135–145)

## 2021-03-14 LAB — CBC
HCT: 36.9 % (ref 36.0–46.0)
Hemoglobin: 12.7 g/dL (ref 12.0–15.0)
MCH: 32.8 pg (ref 26.0–34.0)
MCHC: 34.4 g/dL (ref 30.0–36.0)
MCV: 95.3 fL (ref 80.0–100.0)
Platelets: 194 10*3/uL (ref 150–400)
RBC: 3.87 MIL/uL (ref 3.87–5.11)
RDW: 13.7 % (ref 11.5–15.5)
WBC: 9.8 10*3/uL (ref 4.0–10.5)
nRBC: 0 % (ref 0.0–0.2)

## 2021-03-14 LAB — SARS CORONAVIRUS 2 (TAT 6-24 HRS): SARS Coronavirus 2: NEGATIVE

## 2021-03-14 LAB — GLUCOSE, CAPILLARY
Glucose-Capillary: 277 mg/dL — ABNORMAL HIGH (ref 70–99)
Glucose-Capillary: 209 mg/dL — ABNORMAL HIGH (ref 70–99)

## 2021-03-14 MED ORDER — INSULIN GLARGINE 100 UNIT/ML ~~LOC~~ SOLN: 4.0000 [IU] | Freq: Every day | SUBCUTANEOUS | 0 refills | Status: AC

## 2021-03-14 MED ORDER — POLYETHYLENE GLYCOL 3350 17 G PO PACK
17.0000 g | PACK | Freq: Every day | ORAL | Status: DC
  Administered 2021-03-14: 17 g via ORAL
  Filled 2021-03-14: qty 1

## 2021-03-14 MED ORDER — WITCH HAZEL-GLYCERIN EX PADS
MEDICATED_PAD | CUTANEOUS | Status: DC | PRN
  Filled 2021-03-14 (×2): qty 100

## 2021-03-14 MED ORDER — INSULIN GLARGINE 100 UNIT/ML ~~LOC~~ SOLN
4.0000 [IU] | Freq: Every day | SUBCUTANEOUS | Status: DC
  Administered 2021-03-14: 4 [IU] via SUBCUTANEOUS
  Filled 2021-03-14 (×2): qty 0.04

## 2021-03-14 MED ORDER — INSULIN GLARGINE 100 UNIT/ML ~~LOC~~ SOLN: 6.0000 [IU] | Freq: Every day | SUBCUTANEOUS | 0 refills | Status: DC

## 2021-03-14 NOTE — TOC Transition Note (Addendum)
Transition of Care [facility]) - CM/SW Discharge Note   Patient Details  Name: [name] [name] MRN: [id number] Date of Birth: 1941-01-01  Transition of Care [facility]) CM/SW Contact:  Candie Chroman, LCSW Phone Number: 03/14/2021, 10:03 AM   Clinical Narrative:  Patient has orders to discharge to [facility] today. RN will call report to [phone]. Daughter will be here around 12:30/12:45 to transport her to the facility. Patient and husband are aware. No further concerns. CSW signing off.   1:56 pm: Family does not think they will be able to get her in the car. EMS transport has been arranged. She is 2nd on the list. [employer] will restart auth for EMS transport. Daughter aware there are no guarantees that it will be covered.  Final next level of care: Skilled Nursing Facility Barriers to Discharge: Barriers Resolved   Patient Goals and CMS Choice Patient states their goals for this hospitalization and ongoing recovery are:: home with home health CMS Medicare.gov Compare Post Acute Care list provided to:: Patient Represenative (must comment) Choice offered to / list presented to : [place]  Discharge Placement   Existing PASRR number confirmed : 03/12/21          Patient chooses bed at: [facility] Patient to be transferred to facility by: Daughter will transport Name of family member notified: [name] [name], [name] [name] Patient and family notified of of transfer: 03/14/21  Discharge Plan and Services                DME Arranged: Gilford Rile rolling,3-N-1 DME Agency:  Celesta Aver) Date DME Agency Contacted: 03/11/21   Representative spoke with at DME Agency: Brenton Grills HH Arranged: PT,OT,RN [place] Agency: [place] ([place]) Date [place]: 03/11/21   Representative spoke with at [place]: [place] ([place]) Interventions     Readmission Risk Interventions No flowsheet data  found.

## 2021-03-14 NOTE — Progress Notes (Signed)
Patient complained that she was having pain in rectal area.  Noted large hemorrhoids and very soft stool.  Patient out of bed to bedside commode but stated she did not have to go she just was in pain.

## 2021-03-14 NOTE — Plan of Care (Signed)
  Problem: Education: Goal: Ability to describe self-care measures that may prevent or decrease complications (Diabetes Survival Skills Education) will improve Outcome: Progressing Goal: Individualized Educational Video(s) Outcome: Progressing   Problem: Cardiac: Goal: Ability to maintain an adequate cardiac output will improve Outcome: Progressing   Problem: Health Behavior/Discharge Planning: Goal: Ability to identify and utilize available resources and services will improve Outcome: Progressing Goal: Ability to manage health-related needs will improve Outcome: Progressing   Problem: Fluid Volume: Goal: Ability to achieve a balanced intake and output will improve Outcome: Progressing   Problem: Metabolic: Goal: Ability to maintain appropriate glucose levels will improve Outcome: Progressing   Problem: Nutritional: Goal: Maintenance of adequate nutrition will improve Outcome: Progressing Goal: Maintenance of adequate weight for body size and type will improve Outcome: Progressing   Problem: Respiratory: Goal: Will regain and/or maintain adequate ventilation Outcome: Progressing   Problem: Urinary Elimination: Goal: Ability to achieve and maintain adequate renal perfusion and functioning will improve Outcome: Progressing   Problem: Education: Goal: Knowledge of General Education information will improve Description: Including pain rating scale, medication(s)/side effects and non-pharmacologic comfort measures Outcome: Progressing   Problem: Health Behavior/Discharge Planning: Goal: Ability to manage health-related needs will improve Outcome: Progressing   Problem: Clinical Measurements: Goal: Ability to maintain clinical measurements within normal limits will improve Outcome: Progressing Goal: Will remain free from infection Outcome: Progressing Goal: Diagnostic test results will improve Outcome: Progressing Goal: Respiratory complications will improve Outcome:  Progressing Goal: Cardiovascular complication will be avoided Outcome: Progressing   Problem: Activity: Goal: Risk for activity intolerance will decrease Outcome: Progressing   Problem: Nutrition: Goal: Adequate nutrition will be maintained Outcome: Progressing   Problem: Coping: Goal: Level of anxiety will decrease Outcome: Progressing   Problem: Elimination: Goal: Will not experience complications related to bowel motility Outcome: Progressing Goal: Will not experience complications related to urinary retention Outcome: Progressing   Problem: Pain Managment: Goal: General experience of comfort will improve Outcome: Progressing   Problem: Safety: Goal: Ability to remain free from injury will improve Outcome: Progressing   Problem: Skin Integrity: Goal: Risk for impaired skin integrity will decrease Outcome: Progressing   

## 2021-03-14 NOTE — Progress Notes (Signed)
Inpatient Diabetes Program Recommendations  AACE/ADA: New Consensus Statement on Inpatient Glycemic Control   Target Ranges:  Prepandial:   less than 140 mg/dL      Peak postprandial:   less than 180 mg/dL (1-2 hours)      Critically ill patients:  140 - 180 mg/dL   Results for YARIELYS, BEED (MRN [id number]) as of 03/14/2021 07:50  Ref. Range 03/13/2021 07:53 03/13/2021 08:36 03/13/2021 11:25 03/13/2021 16:49 03/13/2021 21:43 03/13/2021 22:17 03/14/2021 07:46  Glucose-Capillary Latest Ref Range: 70 - 99 mg/dL 54 (L) 120 (H) 254 (H)  Novolog 7 units 137 (H)  Novolog 3 units 69 (L) 138 (H) 277 (H)   Review of Glycemic Control  Diabetes history: DM1  Outpatient Diabetes medications: Lantus 10 units daily, Novolog 2-5 units TID with meals Current orders for Inpatient glycemic control: Lantus 6 units QHS, Novolog 0-9 units  TID with meals, Novolog 0-5 units QHS, Novolog 2 units TID with meals  Inpatient Diabetes Program Recommendations:    Insulin: Noted Lantus was NOT GIVEN last night. Fasting glucose 277 mg/dl today.  Please consider decreasing Lantus to 4 units and change to daily to start now.  Thanks, Barnie Alderman, RN, MSN, CDE Diabetes Coordinator Inpatient Diabetes Program [phone] (Team Pager from 8am to 5pm)

## 2021-03-14 NOTE — Plan of Care (Addendum)
Axox4. Calm and cooperative and able to voice her needs. BG level 69 last evening. Tech stated pt had a brief loss consciousness. Upon entering in the room pt was awake.  D50 IV adm and juice adm and rechecked BG 138. NP Randol Kern notified. Novolog and Lantus held. Further crackers and Milk offered later on but pt refused. She drank some water and stated "she felt much better". Pt vitals being monitored carefully. Safety measures in place. Will continue to monitor.  Problem: Education: Goal: Ability to describe self-care measures that may prevent or decrease complications (Diabetes Survival Skills Education) will improve Outcome: Progressing Goal: Individualized Educational Video(s) Outcome: Progressing   Problem: Cardiac: Goal: Ability to maintain an adequate cardiac output will improve Outcome: Progressing   Problem: Health Behavior/Discharge Planning: Goal: Ability to identify and utilize available resources and services will improve Outcome: Progressing Goal: Ability to manage health-related needs will improve Outcome: Progressing   Problem: Fluid Volume: Goal: Ability to achieve a balanced intake and output will improve Outcome: Progressing   Problem: Metabolic: Goal: Ability to maintain appropriate glucose levels will improve Outcome: Progressing   Problem: Nutritional: Goal: Maintenance of adequate nutrition will improve Outcome: Progressing Goal: Maintenance of adequate weight for body size and type will improve Outcome: Progressing   Problem: Health Behavior/Discharge Planning: Goal: Ability to manage health-related needs will improve Outcome: Progressing   Problem: Clinical Measurements: Goal: Ability to maintain clinical measurements within normal limits will improve Outcome: Progressing Goal: Will remain free from infection Outcome: Progressing Goal: Diagnostic test results will improve Outcome: Progressing Goal: Respiratory complications will improve Outcome:  Progressing Goal: Cardiovascular complication will be avoided Outcome: Progressing   Problem: Activity: Goal: Risk for activity intolerance will decrease Outcome: Progressing   Problem: Nutrition: Goal: Adequate nutrition will be maintained Outcome: Progressing   Problem: Coping: Goal: Level of anxiety will decrease Outcome: Progressing   Problem: Elimination: Goal: Will not experience complications related to bowel motility Outcome: Progressing Goal: Will not experience complications related to urinary retention Outcome: Progressing   Problem: Pain Managment: Goal: General experience of comfort will improve Outcome: Progressing   Problem: Safety: Goal: Ability to remain free from injury will improve Outcome: Progressing   Problem: Skin Integrity: Goal: Risk for impaired skin integrity will decrease Outcome: Progressing

## 2021-03-14 NOTE — Plan of Care (Signed)
Problem: Education: Goal: Ability to describe self-care measures that may prevent or decrease complications (Diabetes Survival Skills Education) will improve 03/14/2021 1118 by Tristan Proto, Winifred Olive, RN Outcome: Adequate for Discharge 03/14/2021 0745 by Vivien Rota, RN Outcome: Progressing Goal: Individualized Educational Video(s) 03/14/2021 1118 by Vivien Rota, RN Outcome: Adequate for Discharge 03/14/2021 0745 by Vivien Rota, RN Outcome: Progressing   Problem: Cardiac: Goal: Ability to maintain an adequate cardiac output will improve 03/14/2021 1118 by Vivien Rota, RN Outcome: Adequate for Discharge 03/14/2021 0745 by Vivien Rota, RN Outcome: Progressing   Problem: Health Behavior/Discharge Planning: Goal: Ability to identify and utilize available resources and services will improve 03/14/2021 1118 by Styles Fambro, Winifred Olive, RN Outcome: Adequate for Discharge 03/14/2021 0745 by Vivien Rota, RN Outcome: Progressing Goal: Ability to manage health-related needs will improve 03/14/2021 1118 by Ahtziry Saathoff, Winifred Olive, RN Outcome: Adequate for Discharge 03/14/2021 0745 by Vivien Rota, RN Outcome: Progressing   Problem: Fluid Volume: Goal: Ability to achieve a balanced intake and output will improve 03/14/2021 1118 by Vivien Rota, RN Outcome: Adequate for Discharge 03/14/2021 0745 by Vivien Rota, RN Outcome: Progressing   Problem: Metabolic: Goal: Ability to maintain appropriate glucose levels will improve 03/14/2021 1118 by Vivien Rota, RN Outcome: Adequate for Discharge 03/14/2021 0745 by Vivien Rota, RN Outcome: Progressing   Problem: Nutritional: Goal: Maintenance of adequate nutrition will improve 03/14/2021 1118 by Vivien Rota, RN Outcome: Adequate for Discharge 03/14/2021 0745 by Vivien Rota, RN Outcome: Progressing Goal: Maintenance of adequate weight for body size  and type will improve 03/14/2021 1118 by Vivien Rota, RN Outcome: Adequate for Discharge 03/14/2021 0745 by Vivien Rota, RN Outcome: Progressing   Problem: Respiratory: Goal: Will regain and/or maintain adequate ventilation 03/14/2021 1118 by Vivien Rota, RN Outcome: Adequate for Discharge 03/14/2021 0745 by Vivien Rota, RN Outcome: Progressing   Problem: Urinary Elimination: Goal: Ability to achieve and maintain adequate renal perfusion and functioning will improve 03/14/2021 1118 by Vivien Rota, RN Outcome: Adequate for Discharge 03/14/2021 0745 by Vivien Rota, RN Outcome: Progressing   Problem: Education: Goal: Knowledge of General Education information will improve Description: Including pain rating scale, medication(s)/side effects and non-pharmacologic comfort measures 03/14/2021 1118 by Vivien Rota, RN Outcome: Adequate for Discharge 03/14/2021 0745 by Vivien Rota, RN Outcome: Progressing   Problem: Health Behavior/Discharge Planning: Goal: Ability to manage health-related needs will improve 03/14/2021 1118 by Vivien Rota, RN Outcome: Adequate for Discharge 03/14/2021 0745 by Vivien Rota, RN Outcome: Progressing   Problem: Clinical Measurements: Goal: Ability to maintain clinical measurements within normal limits will improve 03/14/2021 1118 by Vivien Rota, RN Outcome: Adequate for Discharge 03/14/2021 0745 by Vivien Rota, RN Outcome: Progressing Goal: Will remain free from infection 03/14/2021 1118 by Vivien Rota, RN Outcome: Adequate for Discharge 03/14/2021 0745 by Vivien Rota, RN Outcome: Progressing Goal: Diagnostic test results will improve 03/14/2021 1118 by Vivien Rota, RN Outcome: Adequate for Discharge 03/14/2021 0745 by Vivien Rota, RN Outcome: Progressing Goal: Respiratory complications will improve 03/14/2021 1118 by  Vivien Rota, RN Outcome: Adequate for Discharge 03/14/2021 0745 by Vivien Rota, RN Outcome: Progressing Goal: Cardiovascular complication will be avoided 03/14/2021 1118 by Vivien Rota, RN Outcome: Adequate for Discharge 03/14/2021 0745 by Vivien Rota, RN Outcome: Progressing   Problem: Activity: Goal: Risk for activity intolerance will decrease 03/14/2021 1118 by Vibhav Waddill, Winifred Olive,  RN Outcome: Adequate for Discharge 03/14/2021 0745 by Vivien Rota, RN Outcome: Progressing   Problem: Nutrition: Goal: Adequate nutrition will be maintained 03/14/2021 1118 by Vivien Rota, RN Outcome: Adequate for Discharge 03/14/2021 0745 by Vivien Rota, RN Outcome: Progressing   Problem: Coping: Goal: Level of anxiety will decrease 03/14/2021 1118 by Vivien Rota, RN Outcome: Adequate for Discharge 03/14/2021 0745 by Vivien Rota, RN Outcome: Progressing   Problem: Elimination: Goal: Will not experience complications related to bowel motility 03/14/2021 1118 by Vivien Rota, RN Outcome: Adequate for Discharge 03/14/2021 0745 by Vivien Rota, RN Outcome: Progressing Goal: Will not experience complications related to urinary retention 03/14/2021 1118 by Vivien Rota, RN Outcome: Adequate for Discharge 03/14/2021 0745 by Vivien Rota, RN Outcome: Progressing   Problem: Pain Managment: Goal: General experience of comfort will improve 03/14/2021 1118 by Vivien Rota, RN Outcome: Adequate for Discharge 03/14/2021 0745 by Vivien Rota, RN Outcome: Progressing   Problem: Safety: Goal: Ability to remain free from injury will improve 03/14/2021 1118 by Vivien Rota, RN Outcome: Adequate for Discharge 03/14/2021 0745 by Vivien Rota, RN Outcome: Progressing   Problem: Skin Integrity: Goal: Risk for impaired skin integrity will decrease 03/14/2021 1118 by  Vivien Rota, RN Outcome: Adequate for Discharge 03/14/2021 0745 by Vivien Rota, RN Outcome: Progressing

## 2021-03-15 DIAGNOSIS — I25118 Atherosclerotic heart disease of native coronary artery with other forms of angina pectoris: Secondary | ICD-10-CM | POA: Diagnosis not present

## 2021-03-15 DIAGNOSIS — I2510 Atherosclerotic heart disease of native coronary artery without angina pectoris: Secondary | ICD-10-CM | POA: Insufficient documentation

## 2021-03-15 DIAGNOSIS — E10649 Type 1 diabetes mellitus with hypoglycemia without coma: Secondary | ICD-10-CM | POA: Diagnosis not present

## 2021-03-15 DIAGNOSIS — E440 Moderate protein-calorie malnutrition: Secondary | ICD-10-CM | POA: Diagnosis not present

## 2021-04-04 DIAGNOSIS — R300 Dysuria: Secondary | ICD-10-CM | POA: Diagnosis not present

## 2021-04-04 DIAGNOSIS — E10649 Type 1 diabetes mellitus with hypoglycemia without coma: Secondary | ICD-10-CM | POA: Diagnosis not present

## 2021-04-04 DIAGNOSIS — Z794 Long term (current) use of insulin: Secondary | ICD-10-CM | POA: Diagnosis not present

## 2021-04-06 DIAGNOSIS — E10649 Type 1 diabetes mellitus with hypoglycemia without coma: Secondary | ICD-10-CM | POA: Diagnosis not present

## 2021-04-06 DIAGNOSIS — Z794 Long term (current) use of insulin: Secondary | ICD-10-CM | POA: Diagnosis not present

## 2021-04-06 DIAGNOSIS — R300 Dysuria: Secondary | ICD-10-CM | POA: Diagnosis not present

## 2021-04-18 DIAGNOSIS — E1065 Type 1 diabetes mellitus with hyperglycemia: Secondary | ICD-10-CM | POA: Diagnosis not present

## 2021-04-18 DIAGNOSIS — C50919 Malignant neoplasm of unspecified site of unspecified female breast: Secondary | ICD-10-CM | POA: Diagnosis not present

## 2021-04-18 DIAGNOSIS — Z0000 Encounter for general adult medical examination without abnormal findings: Secondary | ICD-10-CM | POA: Diagnosis not present

## 2021-04-18 DIAGNOSIS — E039 Hypothyroidism, unspecified: Secondary | ICD-10-CM | POA: Diagnosis not present

## 2021-04-18 DIAGNOSIS — I25118 Atherosclerotic heart disease of native coronary artery with other forms of angina pectoris: Secondary | ICD-10-CM | POA: Diagnosis not present

## 2021-04-18 DIAGNOSIS — F0390 Unspecified dementia without behavioral disturbance: Secondary | ICD-10-CM | POA: Diagnosis not present

## 2021-04-18 DIAGNOSIS — E10649 Type 1 diabetes mellitus with hypoglycemia without coma: Secondary | ICD-10-CM | POA: Diagnosis not present

## 2021-04-18 DIAGNOSIS — Z794 Long term (current) use of insulin: Secondary | ICD-10-CM | POA: Diagnosis not present

## 2021-04-18 DIAGNOSIS — I10 Essential (primary) hypertension: Secondary | ICD-10-CM | POA: Diagnosis not present

## 2021-04-18 DIAGNOSIS — E440 Moderate protein-calorie malnutrition: Secondary | ICD-10-CM | POA: Diagnosis not present

## 2021-05-18 ENCOUNTER — Other Ambulatory Visit: Payer: Self-pay

## 2021-05-18 ENCOUNTER — Ambulatory Visit: Payer: PPO | Admitting: Podiatry

## 2021-05-18 ENCOUNTER — Encounter: Payer: Self-pay | Admitting: Podiatry

## 2021-05-18 DIAGNOSIS — B351 Tinea unguium: Secondary | ICD-10-CM

## 2021-05-18 DIAGNOSIS — M79674 Pain in right toe(s): Secondary | ICD-10-CM

## 2021-05-18 DIAGNOSIS — Q828 Other specified congenital malformations of skin: Secondary | ICD-10-CM

## 2021-05-18 DIAGNOSIS — M79675 Pain in left toe(s): Secondary | ICD-10-CM

## 2021-05-18 DIAGNOSIS — E109 Type 1 diabetes mellitus without complications: Secondary | ICD-10-CM

## 2021-05-18 DIAGNOSIS — N179 Acute kidney failure, unspecified: Secondary | ICD-10-CM

## 2021-05-18 NOTE — Progress Notes (Signed)
This patient returns to my office for at risk foot care.  This patient requires this care by a professional since this patient will be at risk due to having  Diabetes.    This patient is unable to cut nails herself since the patient cannot reach her nails.These nails are painful walking and wearing shoes.  This patient presents for at risk foot care today.  Patient has multiple calluses both feet.  General Appearance  Alert, conversant and in no acute stress.  Vascular  Dorsalis pedis and posterior tibial  pulses are  weakly palpable  bilaterally.  Capillary return is within normal limits  bilaterally.  Cold feet  bilaterally.Absent digital hair.  Neurologic  Senn-Weinstein monofilament wire test within normal limits  bilaterally. Muscle power within normal limits bilaterally.  Nails Thick disfigured discolored nails with subungual debris  from hallux to fifth toes bilaterally. No evidence of bacterial infection or drainage bilaterally.  Orthopedic  No limitations of motion  feet .  No crepitus or effusions noted.  No bony pathology or digital deformities noted.  Skin  normotropic skin with no porokeratosis noted bilaterally.  No signs of infections or ulcers noted.  Porokeratosis sub 2  B/L, sub 4 left   and sub 4,5 right foot.   Onychomycosis  Pain in right toes  Pain in left toes  Porokeratosis  B/L.  Consent was obtained for treatment procedures.   Mechanical debridement of nails 1-5  bilaterally performed with a nail nipper.  Filed with dremel without incident. No infection or ulcer.  Debridement of callus  B/L with # 15 blade.   Return office visit    3 months                  Told patient to return for periodic foot care and evaluation due to potential at risk complications.   Gardiner Barefoot DPM

## 2021-06-16 DIAGNOSIS — E10649 Type 1 diabetes mellitus with hypoglycemia without coma: Secondary | ICD-10-CM | POA: Diagnosis not present

## 2021-06-23 DIAGNOSIS — E10649 Type 1 diabetes mellitus with hypoglycemia without coma: Secondary | ICD-10-CM | POA: Diagnosis not present

## 2021-06-23 DIAGNOSIS — Z794 Long term (current) use of insulin: Secondary | ICD-10-CM | POA: Diagnosis not present

## 2021-08-23 DIAGNOSIS — R000 Tachycardia, unspecified: Secondary | ICD-10-CM | POA: Diagnosis not present

## 2021-08-23 DIAGNOSIS — R404 Transient alteration of awareness: Secondary | ICD-10-CM | POA: Diagnosis not present

## 2021-08-23 DIAGNOSIS — E162 Hypoglycemia, unspecified: Secondary | ICD-10-CM | POA: Diagnosis not present

## 2021-08-23 DIAGNOSIS — I10 Essential (primary) hypertension: Secondary | ICD-10-CM | POA: Diagnosis not present

## 2021-08-23 DIAGNOSIS — E161 Other hypoglycemia: Secondary | ICD-10-CM | POA: Diagnosis not present

## 2021-08-24 ENCOUNTER — Ambulatory Visit (INDEPENDENT_AMBULATORY_CARE_PROVIDER_SITE_OTHER): Payer: PPO | Admitting: Podiatry

## 2021-08-24 ENCOUNTER — Other Ambulatory Visit: Payer: PPO

## 2021-08-24 ENCOUNTER — Other Ambulatory Visit: Payer: Self-pay

## 2021-08-24 ENCOUNTER — Encounter: Payer: Self-pay | Admitting: Podiatry

## 2021-08-24 ENCOUNTER — Ambulatory Visit: Payer: PPO

## 2021-08-24 ENCOUNTER — Ambulatory Visit: Payer: PPO | Admitting: Oncology

## 2021-08-24 ENCOUNTER — Encounter (INDEPENDENT_AMBULATORY_CARE_PROVIDER_SITE_OTHER): Payer: Self-pay

## 2021-08-24 DIAGNOSIS — I25118 Atherosclerotic heart disease of native coronary artery with other forms of angina pectoris: Secondary | ICD-10-CM | POA: Diagnosis not present

## 2021-08-24 DIAGNOSIS — M79674 Pain in right toe(s): Secondary | ICD-10-CM

## 2021-08-24 DIAGNOSIS — I10 Essential (primary) hypertension: Secondary | ICD-10-CM | POA: Diagnosis not present

## 2021-08-24 DIAGNOSIS — E109 Type 1 diabetes mellitus without complications: Secondary | ICD-10-CM

## 2021-08-24 DIAGNOSIS — N179 Acute kidney failure, unspecified: Secondary | ICD-10-CM

## 2021-08-24 DIAGNOSIS — E440 Moderate protein-calorie malnutrition: Secondary | ICD-10-CM | POA: Diagnosis not present

## 2021-08-24 DIAGNOSIS — D649 Anemia, unspecified: Secondary | ICD-10-CM | POA: Diagnosis not present

## 2021-08-24 DIAGNOSIS — B351 Tinea unguium: Secondary | ICD-10-CM | POA: Diagnosis not present

## 2021-08-24 DIAGNOSIS — E039 Hypothyroidism, unspecified: Secondary | ICD-10-CM | POA: Diagnosis not present

## 2021-08-24 DIAGNOSIS — E1065 Type 1 diabetes mellitus with hyperglycemia: Secondary | ICD-10-CM | POA: Diagnosis not present

## 2021-08-24 DIAGNOSIS — M79675 Pain in left toe(s): Secondary | ICD-10-CM | POA: Diagnosis not present

## 2021-08-24 DIAGNOSIS — C50919 Malignant neoplasm of unspecified site of unspecified female breast: Secondary | ICD-10-CM | POA: Diagnosis not present

## 2021-08-24 NOTE — Progress Notes (Signed)
This patient returns to my office for at risk foot care.  This patient requires this care by a professional since this patient will be at risk due to having  Diabetes.    This patient is unable to cut nails herself since the patient cannot reach her nails.These nails are painful walking and wearing shoes.  This patient presents for at risk foot care today.  Patient has multiple calluses both feet.  General Appearance  Alert, conversant and in no acute stress.  Vascular  Dorsalis pedis and posterior tibial  pulses are  weakly palpable  bilaterally.  Capillary return is within normal limits  bilaterally.  Cold feet  bilaterally.Absent digital hair.  Neurologic  Senn-Weinstein monofilament wire test within normal limits  bilaterally. Muscle power within normal limits bilaterally.  Nails Thick disfigured discolored nails with subungual debris  from hallux to fifth toes bilaterally. No evidence of bacterial infection or drainage bilaterally.  Orthopedic  No limitations of motion  feet .  No crepitus or effusions noted.  No bony pathology or digital deformities noted.  Skin  normotropic skin with no porokeratosis noted bilaterally.  No signs of infections or ulcers noted.  Porokeratosis sub 2  B/L, sub 4 left   and sub 4,5 right foot asymptomatic.  Onychomycosis  Pain in right toes  Pain in left toes   Consent was obtained for treatment procedures.   Mechanical debridement of nails 1-5  bilaterally performed with a nail nipper.  Filed with dremel without incident. No infection or ulcer.    Return office visit    3 months                  Told patient to return for periodic foot care and evaluation due to potential at risk complications.   Gardiner Barefoot DPM

## 2021-08-31 DIAGNOSIS — Z0000 Encounter for general adult medical examination without abnormal findings: Secondary | ICD-10-CM | POA: Diagnosis not present

## 2021-08-31 DIAGNOSIS — E10649 Type 1 diabetes mellitus with hypoglycemia without coma: Secondary | ICD-10-CM | POA: Diagnosis not present

## 2021-08-31 DIAGNOSIS — Z23 Encounter for immunization: Secondary | ICD-10-CM | POA: Diagnosis not present

## 2021-08-31 DIAGNOSIS — C50919 Malignant neoplasm of unspecified site of unspecified female breast: Secondary | ICD-10-CM | POA: Diagnosis not present

## 2021-08-31 DIAGNOSIS — D649 Anemia, unspecified: Secondary | ICD-10-CM | POA: Diagnosis not present

## 2021-08-31 DIAGNOSIS — I25118 Atherosclerotic heart disease of native coronary artery with other forms of angina pectoris: Secondary | ICD-10-CM | POA: Diagnosis not present

## 2021-08-31 DIAGNOSIS — E538 Deficiency of other specified B group vitamins: Secondary | ICD-10-CM | POA: Diagnosis not present

## 2021-08-31 DIAGNOSIS — E039 Hypothyroidism, unspecified: Secondary | ICD-10-CM | POA: Diagnosis not present

## 2021-09-04 ENCOUNTER — Ambulatory Visit
Admission: RE | Admit: 2021-09-04 | Discharge: 2021-09-04 | Disposition: A | Discharge status: Home / Self Care | Payer: PPO | Source: Ambulatory Visit | Attending: Oncology | Admitting: Oncology

## 2021-09-04 ENCOUNTER — Other Ambulatory Visit: Payer: Self-pay

## 2021-09-04 DIAGNOSIS — Z853 Personal history of malignant neoplasm of breast: Secondary | ICD-10-CM | POA: Diagnosis not present

## 2021-09-04 DIAGNOSIS — M810 Age-related osteoporosis without current pathological fracture: Secondary | ICD-10-CM | POA: Diagnosis not present

## 2021-09-04 DIAGNOSIS — Z780 Asymptomatic menopausal state: Secondary | ICD-10-CM | POA: Diagnosis not present

## 2021-09-04 DIAGNOSIS — Z7983 Long term (current) use of bisphosphonates: Secondary | ICD-10-CM | POA: Insufficient documentation

## 2021-09-05 ENCOUNTER — Ambulatory Visit: Payer: PPO | Admitting: Oncology

## 2021-09-05 ENCOUNTER — Ambulatory Visit: Payer: PPO

## 2021-09-05 ENCOUNTER — Other Ambulatory Visit: Payer: PPO

## 2021-09-13 ENCOUNTER — Inpatient Hospital Stay: Payer: PPO | Attending: Oncology

## 2021-09-13 ENCOUNTER — Inpatient Hospital Stay (HOSPITAL_BASED_OUTPATIENT_CLINIC_OR_DEPARTMENT_OTHER): Payer: PPO | Admitting: Oncology

## 2021-09-13 ENCOUNTER — Inpatient Hospital Stay: Payer: PPO

## 2021-09-13 ENCOUNTER — Other Ambulatory Visit: Payer: Self-pay

## 2021-09-13 VITALS — BP 138/69 | HR 87 | Temp 97.0°F | Resp 18 | Wt 94.0 lb

## 2021-09-13 DIAGNOSIS — Z853 Personal history of malignant neoplasm of breast: Secondary | ICD-10-CM

## 2021-09-13 DIAGNOSIS — Z170 Estrogen receptor positive status [ER+]: Secondary | ICD-10-CM | POA: Insufficient documentation

## 2021-09-13 DIAGNOSIS — Z7983 Long term (current) use of bisphosphonates: Secondary | ICD-10-CM

## 2021-09-13 DIAGNOSIS — C50911 Malignant neoplasm of unspecified site of right female breast: Secondary | ICD-10-CM | POA: Diagnosis not present

## 2021-09-13 DIAGNOSIS — Z7982 Long term (current) use of aspirin: Secondary | ICD-10-CM | POA: Diagnosis not present

## 2021-09-13 DIAGNOSIS — M810 Age-related osteoporosis without current pathological fracture: Secondary | ICD-10-CM

## 2021-09-13 DIAGNOSIS — E1165 Type 2 diabetes mellitus with hyperglycemia: Secondary | ICD-10-CM | POA: Diagnosis not present

## 2021-09-13 DIAGNOSIS — R7989 Other specified abnormal findings of blood chemistry: Secondary | ICD-10-CM | POA: Insufficient documentation

## 2021-09-13 DIAGNOSIS — R739 Hyperglycemia, unspecified: Secondary | ICD-10-CM | POA: Diagnosis not present

## 2021-09-13 DIAGNOSIS — Z79899 Other long term (current) drug therapy: Secondary | ICD-10-CM | POA: Insufficient documentation

## 2021-09-13 DIAGNOSIS — Z79810 Long term (current) use of selective estrogen receptor modulators (SERMs): Secondary | ICD-10-CM | POA: Insufficient documentation

## 2021-09-13 LAB — COMPREHENSIVE METABOLIC PANEL
ALT: 50 U/L — ABNORMAL HIGH (ref 0–44)
AST: 71 U/L — ABNORMAL HIGH (ref 15–41)
Albumin: 4.1 g/dL (ref 3.5–5.0)
Alkaline Phosphatase: 52 U/L (ref 38–126)
Anion gap: 12 (ref 5–15)
BUN: 22 mg/dL (ref 8–23)
CO2: 24 mmol/L (ref 22–32)
Calcium: 9.2 mg/dL (ref 8.9–10.3)
Chloride: 95 mmol/L — ABNORMAL LOW (ref 98–111)
Creatinine, Ser: 0.93 mg/dL (ref 0.44–1.00)
GFR, Estimated: >60 mL/min (ref >60)
Glucose, Bld: 650 mg/dL (ref 70–99)
Potassium: 4.2 mmol/L (ref 3.5–5.1)
Sodium: 131 mmol/L — ABNORMAL LOW (ref 135–145)
Total Bilirubin: 0.8 mg/dL (ref 0.3–1.2)
Total Protein: 6.8 g/dL (ref 6.5–8.1)

## 2021-09-13 MED ORDER — DENOSUMAB 60 MG/ML ~~LOC~~ SOSY
60.0000 mg | PREFILLED_SYRINGE | SUBCUTANEOUS | Status: DC
  Administered 2021-09-13: 60 mg via SUBCUTANEOUS
  Filled 2021-09-13: qty 1

## 2021-09-13 NOTE — Progress Notes (Signed)
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  Oncotype DX score was 31 but patient did not receive any chemotherapy or radiation.  She started taking tamoxifen in August 2011.  She does have baseline osteoporosis and has been on Prolia every 6 months.  Interval history-patient presents with her husband today.  Reports doing well and denies any new concerns.  Appetite is stable.  Weight is decreased.  She has had an interval bone density scan.  She is here for Prolia.  ECOG PS- 2 Pain scale- 0   Review of systems- Review of Systems  All other systems reviewed and are negative.   Allergies  Allergen Reactions   Lisinopril Rash and Other (See Comments)    Hyponatremia  Hyponatremia  Hyponatremia    Sulfa Antibiotics Other (See Comments)    possible possible Patient doesn't know    Sulfasalazine      Past Medical History:  Diagnosis Date   Breast cancer ([place]) 2011   RT MASTECTOMY   Cancer ([place]) 2011   BREAST CA   diabetes insulin dep    High cholesterol    Hypertension    Hypothyroidism    Osteoporosis    Thyroid disease      Past Surgical History:  Procedure Laterality Date   BREAST BIOPSY Right 2011   positive   CATARACT EXTRACTION W/PHACO Right 06/13/2016   Procedure:  CATARACT EXTRACTION PHACO AND INTRAOCULAR LENS PLACEMENT (IOC);  Surgeon: Estill Cotta, MD;  Location: [facility] ORS;  Service: Ophthalmology;  Laterality: Right;  [place] 03:07 AP% 27.4 CDE 93.21 fluid pack lot # [id number]   MASTECTOMY Right 2011   positive   TONSILLECTOMY     TOTAL ABDOMINAL HYSTERECTOMY      Social History   Socioeconomic History   Marital status: Married    Spouse name: Not on file   Number of children: Not on file   Years of education: Not on file   Highest education level: Not on file  Occupational History   Not on file  Tobacco Use   Smoking status: Never   Smokeless tobacco: Never  Substance and Sexual Activity   Alcohol use: No   Drug use: No   Sexual activity: Not on file  Other Topics Concern   Not on file  Social History Narrative   Not on file   Social Determinants of Health   Financial Resource Strain: Not on file  Food Insecurity: Not on file  Transportation Needs: Not on file  Physical Activity: Not on file  Stress: Not on file  Social Connections: Not on file  Intimate Partner Violence: Not on file    Family History  Problem Relation Age of Onset   Heart disease Father        heart attack   Breast cancer Neg Hx      Current  Outpatient Medications:    aspirin EC 81 MG EC tablet, Take 1 tablet (81 mg total) by mouth daily. Swallow whole., Disp: 30 tablet, Rfl:    Calcium Carbonate-Vitamin D 500-125 MG-UNIT TABS, Take by mouth., Disp: , Rfl:    denosumab (PROLIA) 60 MG/ML SOLN injection, Inject 60 mg into the skin every 6 (six) months., Disp: , Rfl:    enalapril (VASOTEC) 20 MG tablet, TAKE 1 TABLET TWICE A DAY, Disp: , Rfl:    insulin aspart (NOVOLOG) 100 UNIT/ML injection, Inject 2-5 units up to three times daily as directed, Disp: , Rfl:    insulin glargine (LANTUS) 100 UNIT/ML injection, Inject 0.04 mLs (4 Units total) into the skin daily., Disp: 10 mL, Rfl: 0   levothyroxine (SYNTHROID) 25 MCG tablet, Take 1 tablet by mouth daily  before breakfast., Disp: , Rfl:    lovastatin (MEVACOR) 20 MG tablet, TAKE 1 TABLET DAILY, Disp: , Rfl:    tamoxifen (NOLVADEX) 20 MG tablet, TAKE 1 TABLET DAILY, Disp: 90 tablet, Rfl: 4   vitamin B-12 (CYANOCOBALAMIN) 100 MCG tablet, Take by mouth., Disp: , Rfl:    glucose blood (ONETOUCH VERIO) test strip, Use 3 (three) times daily. ONE TOUCH VERIO TEST STRIPS. E10.9 (Patient not taking: Reported on 09/13/2021), Disp: , Rfl:    levothyroxine (SYNTHROID) 25 MCG tablet, Take by mouth. (Patient not taking: Reported on 09/13/2021), Disp: , Rfl:    metoprolol tartrate (LOPRESSOR) 25 MG tablet, Take 0.5 tablets (12.5 mg total) by mouth 2 (two) times daily., Disp: 90 tablet, Rfl: 2 No current facility-administered medications for this visit.  Facility-Administered Medications Ordered in Other Visits:    denosumab (PROLIA) injection 60 mg, 60 mg, Subcutaneous, Q6 months, Sindy Guadeloupe, MD, 60 mg at 02/21/21 1111   denosumab (PROLIA) injection 60 mg, 60 mg, Subcutaneous, Q6 months, Sindy Guadeloupe, MD, 60 mg at 09/13/21 1450  Physical exam:  Vitals:   09/13/21 1348 09/13/21 1351  BP:  138/69  Pulse:  87  Resp:  18  Temp:  (!) 97 F (36.1 C)  TempSrc:  Tympanic  SpO2:  98%  Weight: 94 lb (42.6 kg)    Physical Exam Constitutional:      Appearance: Normal appearance.  HENT:     Head: Normocephalic and atraumatic.  Eyes:     Pupils: Pupils are equal, round, and reactive to light.  Cardiovascular:     Rate and Rhythm: Normal rate and regular rhythm.     Heart sounds: Normal heart sounds. No murmur heard. Pulmonary:     Effort: Pulmonary effort is normal.     Breath sounds: Normal breath sounds. No wheezing.  Abdominal:     General: Bowel sounds are normal. There is no distension.     Palpations: Abdomen is soft.     Tenderness: There is no abdominal tenderness.  Musculoskeletal:        General: Normal range of motion.     Cervical back: Normal range of motion.  Skin:    General:  Skin is warm and dry.     Findings: No rash.  Neurological:     Mental Status: She is alert and oriented to person, place, and time.  Psychiatric:        Judgment: Judgment normal.    CMP Latest Ref Rng & Units 09/13/2021  Glucose 70 - 99 mg/dL 650(HH)  BUN 8 - 23 mg/dL 22  Creatinine 0.44 - 1.00 mg/dL 0.93  Sodium 135 - 145 mmol/L 131(L)  Potassium  3.5 - 5.1 mmol/L 4.2  Chloride 98 - 111 mmol/L 95(L)  CO2 22 - 32 mmol/L 24  Calcium 8.9 - 10.3 mg/dL 9.2  Total Protein 6.5 - 8.1 g/dL 6.8  Total Bilirubin 0.3 - 1.2 mg/dL 0.8  Alkaline Phos 38 - 126 U/L 52  AST 15 - 41 U/L 71(H)  ALT 0 - 44 U/L 50(H)   CBC Latest Ref Rng & Units 03/14/2021  WBC 4.0 - 10.5 K/uL 9.8  Hemoglobin 12.0 - 15.0 g/dL 12.7  Hematocrit 36.0 - 46.0 % 36.9  Platelets 150 - 400 K/uL 194     Assessment and plan- Patient is a 80 y.o. female with stage II right breast cancer in 2011 s/p mastectomy and 10 years of tamoxifen therapy here for routine follow-up.  Clinically, patient appears to be doing well with no concerning signs of recurrence.  Most recent mammogram is from October 2021 and was read as BI-RADS Category 1 negative.  We will repeat next week.  She is no longer taking tamoxifen.  She completed 10 years of treatment in 2021.  Osteoporosis-she is currently on Prolia every 6 months.  Most recent bone density shows a T score of -4.2 which is essentially stable from previous T score of -3.9.  She is on calcium and vitamin D.  Labs from today show calcium level of 9.2.  Proceed with Prolia.  Hyperglycemia-incidentally noted to have a blood sugar of 650.  Patient's husband states she took 2 units of NovoLog and 6 units of Lantus this morning for a elevated blood sugar in the 300s.  States she has not had anything to eat today.  Discussed with Dr. Janese Banks who recommends ED evaluation.  Patient and husband adamantly refused.   Elevated LFTs-AST 71 and ALT is 50.  On chart review she has never had any issues with  her liver enzymes in the past.  We will reach out to her primary care doctor for close follow-up.  Disposition-Prolia today.  Continue to recommend ED evaluation for elevated blood sugar and elevated LFTs.  Patient and husband declined.  They wish to call her primary care doctor or Dr. Gabriel Carina when they get home to discuss her sliding scale insulin and how to correct her blood sugar.  We discussed at length why ED evaluation was needed.   I spent 25 minutes dedicated to the care of this patient (face-to-face and non-face-to-face) on the date of the encounter to include what is described in the assessment and plan.  Visit Diagnosis 1. Osteoporosis without current pathological fracture, unspecified osteoporosis type   2. Hx of breast cancer   3. Hyperglycemia   4. Elevated LFTs    Faythe Casa, NP 09/13/2021 3:42 PM

## 2021-09-25 DIAGNOSIS — Z961 Presence of intraocular lens: Secondary | ICD-10-CM | POA: Diagnosis not present

## 2021-10-20 DIAGNOSIS — E10649 Type 1 diabetes mellitus with hypoglycemia without coma: Secondary | ICD-10-CM | POA: Diagnosis not present

## 2021-10-27 DIAGNOSIS — E10649 Type 1 diabetes mellitus with hypoglycemia without coma: Secondary | ICD-10-CM | POA: Diagnosis not present

## 2021-10-27 DIAGNOSIS — Z794 Long term (current) use of insulin: Secondary | ICD-10-CM | POA: Diagnosis not present

## 2021-11-29 ENCOUNTER — Encounter: Payer: Self-pay | Admitting: Hematology and Oncology

## 2021-11-30 ENCOUNTER — Other Ambulatory Visit: Payer: Self-pay

## 2021-11-30 ENCOUNTER — Ambulatory Visit: Payer: HMO | Admitting: Podiatry

## 2021-11-30 ENCOUNTER — Encounter: Payer: Self-pay | Admitting: Podiatry

## 2021-11-30 DIAGNOSIS — N179 Acute kidney failure, unspecified: Secondary | ICD-10-CM | POA: Diagnosis not present

## 2021-11-30 DIAGNOSIS — Q828 Other specified congenital malformations of skin: Secondary | ICD-10-CM | POA: Diagnosis not present

## 2021-11-30 DIAGNOSIS — B351 Tinea unguium: Secondary | ICD-10-CM | POA: Diagnosis not present

## 2021-11-30 DIAGNOSIS — E109 Type 1 diabetes mellitus without complications: Secondary | ICD-10-CM

## 2021-11-30 DIAGNOSIS — M79675 Pain in left toe(s): Secondary | ICD-10-CM | POA: Diagnosis not present

## 2021-11-30 DIAGNOSIS — M79674 Pain in right toe(s): Secondary | ICD-10-CM

## 2021-11-30 NOTE — Progress Notes (Signed)
This patient returns to my office for at risk foot care.  This patient requires this care by a professional since this patient will be at risk due to having  Diabetes.    This patient is unable to cut nails herself since the patient cannot reach her nails.These nails are painful walking and wearing shoes.  This patient presents for at risk foot care today.  Patient has multiple calluses both feet.  General Appearance  Alert, conversant and in no acute stress.  Vascular  Dorsalis pedis and posterior tibial  pulses are  weakly palpable  bilaterally.  Capillary return is within normal limits  bilaterally.  Cold feet  bilaterally.Absent digital hair.  Neurologic  Senn-Weinstein monofilament wire test within normal limits  bilaterally. Muscle power within normal limits bilaterally.  Nails Thick disfigured discolored nails with subungual debris  from hallux to fifth toes bilaterally. No evidence of bacterial infection or drainage bilaterally.  Orthopedic  No limitations of motion  feet .  No crepitus or effusions noted.  No bony pathology or digital deformities noted.  Skin  normotropic skin with no porokeratosis noted bilaterally.  No signs of infections or ulcers noted.  Porokeratosis sub 2  B/L, sub 4 left   and sub 4,5 right foot   Onychomycosis  Pain in right toes  Pain in left toes Callus forefeet  B/L.  Consent was obtained for treatment procedures.   Mechanical debridement of nails 1-5  bilaterally performed with a nail nipper.  Filed with dremel without incident.  Debridement of porokeratosis with # 15 blade  B/l. No infection or ulcer.    Return office visit    3 months                  Told patient to return for periodic foot care and evaluation due to potential at risk complications.   Gardiner Barefoot DPM

## 2021-12-27 DIAGNOSIS — I25118 Atherosclerotic heart disease of native coronary artery with other forms of angina pectoris: Secondary | ICD-10-CM | POA: Diagnosis not present

## 2021-12-27 DIAGNOSIS — C50919 Malignant neoplasm of unspecified site of unspecified female breast: Secondary | ICD-10-CM | POA: Diagnosis not present

## 2021-12-27 DIAGNOSIS — R8290 Unspecified abnormal findings in urine: Secondary | ICD-10-CM | POA: Diagnosis not present

## 2021-12-27 DIAGNOSIS — E538 Deficiency of other specified B group vitamins: Secondary | ICD-10-CM | POA: Diagnosis not present

## 2021-12-27 DIAGNOSIS — E039 Hypothyroidism, unspecified: Secondary | ICD-10-CM | POA: Diagnosis not present

## 2021-12-27 DIAGNOSIS — D649 Anemia, unspecified: Secondary | ICD-10-CM | POA: Diagnosis not present

## 2021-12-27 DIAGNOSIS — Z0000 Encounter for general adult medical examination without abnormal findings: Secondary | ICD-10-CM | POA: Diagnosis not present

## 2021-12-27 DIAGNOSIS — E10649 Type 1 diabetes mellitus with hypoglycemia without coma: Secondary | ICD-10-CM | POA: Diagnosis not present

## 2022-01-03 DIAGNOSIS — Z0000 Encounter for general adult medical examination without abnormal findings: Secondary | ICD-10-CM | POA: Diagnosis not present

## 2022-01-03 DIAGNOSIS — E440 Moderate protein-calorie malnutrition: Secondary | ICD-10-CM | POA: Diagnosis not present

## 2022-01-03 DIAGNOSIS — F0390 Unspecified dementia without behavioral disturbance: Secondary | ICD-10-CM | POA: Diagnosis not present

## 2022-01-03 DIAGNOSIS — E039 Hypothyroidism, unspecified: Secondary | ICD-10-CM | POA: Diagnosis not present

## 2022-01-03 DIAGNOSIS — I2510 Atherosclerotic heart disease of native coronary artery without angina pectoris: Secondary | ICD-10-CM | POA: Diagnosis not present

## 2022-01-03 DIAGNOSIS — I10 Essential (primary) hypertension: Secondary | ICD-10-CM | POA: Diagnosis not present

## 2022-01-03 DIAGNOSIS — C50919 Malignant neoplasm of unspecified site of unspecified female breast: Secondary | ICD-10-CM | POA: Diagnosis not present

## 2022-01-03 DIAGNOSIS — I25118 Atherosclerotic heart disease of native coronary artery with other forms of angina pectoris: Secondary | ICD-10-CM | POA: Diagnosis not present

## 2022-01-03 DIAGNOSIS — E1065 Type 1 diabetes mellitus with hyperglycemia: Secondary | ICD-10-CM | POA: Diagnosis not present

## 2022-02-22 DIAGNOSIS — E10649 Type 1 diabetes mellitus with hypoglycemia without coma: Secondary | ICD-10-CM | POA: Diagnosis not present

## 2022-02-28 DIAGNOSIS — E10649 Type 1 diabetes mellitus with hypoglycemia without coma: Secondary | ICD-10-CM | POA: Diagnosis not present

## 2022-02-28 DIAGNOSIS — M810 Age-related osteoporosis without current pathological fracture: Secondary | ICD-10-CM | POA: Diagnosis not present

## 2022-02-28 DIAGNOSIS — Z794 Long term (current) use of insulin: Secondary | ICD-10-CM | POA: Diagnosis not present

## 2022-03-12 ENCOUNTER — Encounter: Payer: Self-pay | Admitting: Podiatry

## 2022-03-12 ENCOUNTER — Ambulatory Visit: Payer: HMO | Admitting: Podiatry

## 2022-03-12 DIAGNOSIS — M79675 Pain in left toe(s): Secondary | ICD-10-CM

## 2022-03-12 DIAGNOSIS — E109 Type 1 diabetes mellitus without complications: Secondary | ICD-10-CM

## 2022-03-12 DIAGNOSIS — B351 Tinea unguium: Secondary | ICD-10-CM

## 2022-03-12 DIAGNOSIS — N179 Acute kidney failure, unspecified: Secondary | ICD-10-CM

## 2022-03-12 DIAGNOSIS — M79674 Pain in right toe(s): Secondary | ICD-10-CM

## 2022-03-12 NOTE — Progress Notes (Signed)
This patient returns to my office for at risk foot care.  This patient requires this care by a professional since this patient will be at risk due to having  Diabetes.    This patient is unable to cut nails herself since the patient cannot reach her nails.These nails are painful walking and wearing shoes.  This patient presents for at risk foot care today.  Patient has multiple calluses both feet.  General Appearance  Alert, conversant and in no acute stress.  Vascular  Dorsalis pedis and posterior tibial  pulses are  weakly palpable  bilaterally.  Capillary return is within normal limits  bilaterally.  Cold feet  bilaterally.Absent digital hair.  Neurologic  Senn-Weinstein monofilament wire test within normal limits  bilaterally. Muscle power within normal limits bilaterally.  Nails Thick disfigured discolored nails with subungual debris  from hallux to fifth toes bilaterally. No evidence of bacterial infection or drainage bilaterally.  Orthopedic  No limitations of motion  feet .  No crepitus or effusions noted.  No bony pathology or digital deformities noted.  Skin  normotropic skin with no porokeratosis noted bilaterally.  No signs of infections or ulcers noted.  Porokeratosis sub 2  B/L, sub 4 left   and sub 4,5 right foot  asymptomatic.  Onychomycosis  Pain in right toes  Pain in left toes Callus forefeet  B/L.  Consent was obtained for treatment procedures.   Mechanical debridement of nails 1-5  bilaterally performed with a nail nipper.  Filed with dremel without incident.   Return office visit    3 months                  Told patient to return for periodic foot care and evaluation due to potential at risk complications.   Telina Kleckley DPM  

## 2022-03-14 ENCOUNTER — Inpatient Hospital Stay: Payer: PPO

## 2022-03-14 ENCOUNTER — Encounter: Payer: Self-pay | Admitting: Oncology

## 2022-03-14 ENCOUNTER — Inpatient Hospital Stay: Payer: PPO | Attending: Oncology

## 2022-03-14 ENCOUNTER — Inpatient Hospital Stay (HOSPITAL_BASED_OUTPATIENT_CLINIC_OR_DEPARTMENT_OTHER): Payer: PPO | Admitting: Oncology

## 2022-03-14 VITALS — BP 118/69 | HR 70 | Temp 97.9°F | Resp 16 | Wt 100.0 lb

## 2022-03-14 DIAGNOSIS — Z79899 Other long term (current) drug therapy: Secondary | ICD-10-CM | POA: Diagnosis not present

## 2022-03-14 DIAGNOSIS — Z5181 Encounter for therapeutic drug level monitoring: Secondary | ICD-10-CM | POA: Diagnosis not present

## 2022-03-14 DIAGNOSIS — M810 Age-related osteoporosis without current pathological fracture: Secondary | ICD-10-CM

## 2022-03-14 DIAGNOSIS — Z794 Long term (current) use of insulin: Secondary | ICD-10-CM | POA: Diagnosis not present

## 2022-03-14 DIAGNOSIS — Z9011 Acquired absence of right breast and nipple: Secondary | ICD-10-CM | POA: Insufficient documentation

## 2022-03-14 DIAGNOSIS — Z853 Personal history of malignant neoplasm of breast: Secondary | ICD-10-CM | POA: Diagnosis not present

## 2022-03-14 DIAGNOSIS — R5383 Other fatigue: Secondary | ICD-10-CM | POA: Diagnosis not present

## 2022-03-14 LAB — CBC WITH DIFFERENTIAL/PLATELET
Abs Immature Granulocytes: 0.03 10*3/uL (ref 0.00–0.07)
Basophils Absolute: 0 10*3/uL (ref 0.0–0.1)
Basophils Relative: 0 %
Eosinophils Absolute: 0.2 10*3/uL (ref 0.0–0.5)
Eosinophils Relative: 2 %
HCT: 39.7 % (ref 36.0–46.0)
Hemoglobin: 13 g/dL (ref 12.0–15.0)
Immature Granulocytes: 0 %
Lymphocytes Relative: 44 %
Lymphs Abs: 4 10*3/uL (ref 0.7–4.0)
MCH: 31.8 pg (ref 26.0–34.0)
MCHC: 32.7 g/dL (ref 30.0–36.0)
MCV: 97.1 fL (ref 80.0–100.0)
Monocytes Absolute: 0.8 10*3/uL (ref 0.1–1.0)
Monocytes Relative: 9 %
Neutro Abs: 4.1 10*3/uL (ref 1.7–7.7)
Neutrophils Relative %: 45 %
Platelets: 205 10*3/uL (ref 150–400)
RBC: 4.09 MIL/uL (ref 3.87–5.11)
RDW: 13.1 % (ref 11.5–15.5)
WBC: 9.2 10*3/uL (ref 4.0–10.5)
nRBC: 0 % (ref 0.0–0.2)

## 2022-03-14 LAB — COMPREHENSIVE METABOLIC PANEL
ALT: 22 U/L (ref 0–44)
AST: 32 U/L (ref 15–41)
Albumin: 3.9 g/dL (ref 3.5–5.0)
Alkaline Phosphatase: 32 U/L — ABNORMAL LOW (ref 38–126)
Anion gap: 8 (ref 5–15)
BUN: 14 mg/dL (ref 8–23)
CO2: 27 mmol/L (ref 22–32)
Calcium: 9 mg/dL (ref 8.9–10.3)
Chloride: 99 mmol/L (ref 98–111)
Creatinine, Ser: 0.73 mg/dL (ref 0.44–1.00)
GFR, Estimated: >60 mL/min (ref >60)
Glucose, Bld: 107 mg/dL — ABNORMAL HIGH (ref 70–99)
Potassium: 4.5 mmol/L (ref 3.5–5.1)
Sodium: 134 mmol/L — ABNORMAL LOW (ref 135–145)
Total Bilirubin: 0.6 mg/dL (ref 0.3–1.2)
Total Protein: 6.4 g/dL — ABNORMAL LOW (ref 6.5–8.1)

## 2022-03-14 MED ORDER — SODIUM CHLORIDE 0.9 % IV SOLN
Freq: Once | INTRAVENOUS | Status: AC | PRN
  Filled 2022-03-14: qty 250

## 2022-03-14 MED ORDER — DENOSUMAB 60 MG/ML ~~LOC~~ SOSY
60.0000 mg | PREFILLED_SYRINGE | SUBCUTANEOUS | Status: AC
  Administered 2022-03-14: 60 mg via SUBCUTANEOUS
  Filled 2022-03-14: qty 1

## 2022-03-14 MED ORDER — DIPHENHYDRAMINE HCL 50 MG/ML IJ SOLN: 25.0000 mg | Freq: Once | INTRAMUSCULAR | Status: DC | PRN

## 2022-03-14 MED ORDER — EPINEPHRINE HCL 0.1 MG/ML IJ SOLN: 0.2500 mg | Freq: Once | INTRAMUSCULAR | Status: DC | PRN

## 2022-03-14 MED ORDER — SODIUM CHLORIDE 0.9 % IV SOLN
Freq: Once | INTRAVENOUS | Status: AC
  Filled 2022-03-14: qty 250

## 2022-03-14 MED ORDER — DIPHENHYDRAMINE HCL 50 MG/ML IJ SOLN: 50.0000 mg | Freq: Once | INTRAMUSCULAR | Status: DC | PRN

## 2022-03-14 MED ORDER — METHYLPREDNISOLONE SODIUM SUCC 125 MG IJ SOLR: 125.0000 mg | Freq: Once | INTRAMUSCULAR | Status: DC | PRN

## 2022-03-14 MED ORDER — ALBUTEROL SULFATE (2.5 MG/3ML) 0.083% IN NEBU
2.5000 mg | INHALATION_SOLUTION | Freq: Once | RESPIRATORY_TRACT | Status: AC | PRN
  Filled 2022-03-14: qty 3

## 2022-03-14 NOTE — Progress Notes (Signed)
Pt has no concerns

## 2022-03-15 ENCOUNTER — Encounter: Payer: Self-pay | Admitting: Hematology and Oncology

## 2022-03-15 NOTE — Progress Notes (Signed)
? ? ? ?Hematology/Oncology Consult note ?[place]  ?Telephone:([phone] [phone] ? ?Patient Care Team: ?Tracie Harrier, MD as PCP - General (Internal Medicine) ?Sindy Guadeloupe, MD as Consulting Physician (Hematology and Oncology)  ? ?Name of the patient: [name] [name]  ?[id number]  ?[birth year]  ? ?Date of visit: 03/15/22 ? ?Diagnosis- history of breast cancer in 2011 ? ?Chief complaint/ Reason for visit- routine f/u of breast cancer and to receive prolia for osteoporosis ? 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  Oncotype DX score was 31 but patient did not receive any chemotherapy or radiation.  She started taking tamoxifen in August 2011.  She does have baseline osteoporosis and has been on Prolia every 6 months. ?  ? ?Interval history- patient is here with her husband. She is doing well for her age. Denies any specific complaints at this time other than fatigue. Appetite and weight has been stable. Denies any breast concerns ? ?ECOG PS- 1 ?Pain scale- 0 ? ? ?Review of systems- Review of Systems  ?Constitutional:  Positive for malaise/fatigue. Negative for chills, fever and weight loss.  ?HENT:  Negative for congestion, ear discharge and nosebleeds.   ?Eyes:  Negative for blurred vision.  ?Respiratory:  Negative for cough, hemoptysis, sputum production, shortness of breath and wheezing.   ?Cardiovascular:  Negative for chest pain, palpitations, orthopnea and claudication.  ?Gastrointestinal:  Negative for abdominal pain, blood in stool, constipation, diarrhea, heartburn, melena, nausea and vomiting.  ?Genitourinary:  Negative for dysuria, flank pain, frequency, hematuria and urgency.  ?Musculoskeletal:  Negative for back pain, joint pain and myalgias.  ?Skin:  Negative for rash.   ?Neurological:  Negative for dizziness, tingling, focal weakness, seizures, weakness and headaches.  ?Endo/Heme/Allergies:  Does not bruise/bleed easily.  ?Psychiatric/Behavioral:  Negative for depression and suicidal ideas. The patient does not have insomnia.    ? ? ? ?Allergies  ?Allergen Reactions  ? Lisinopril Rash and Other (See Comments)  ?  Hyponatremia  ?Hyponatremia  ?Hyponatremia   ? Sulfa Antibiotics Other (See Comments)  ?  possible ?possible ?Patient doesn't know   ? Sulfasalazine   ? ? ? ?Past Medical History:  ?Diagnosis Date  ? Breast cancer [facility]) 2011  ? RT MASTECTOMY  ? Cancer [facility]) 2011  ? BREAST CA  ? diabetes insulin dep   ? High cholesterol   ? Hypertension   ? Hypothyroidism   ? Osteoporosis   ? Thyroid disease   ? ? ? ?Past Surgical History:  ?Procedure Laterality Date  ? BREAST BIOPSY Right 2011  ? positive  ? CATARACT EXTRACTION W/PHACO Right 06/13/2016  ? Procedure: CATARACT EXTRACTION PHACO AND INTRAOCULAR LENS PLACEMENT (IOC);  Surgeon: Estill Cotta, MD;  Location: [facility] ORS;  Service: Ophthalmology;  Laterality: Right;  [place] 03:07 ?AP% 27.4 ?CDE 93.21 ?fluid pack lot # [id number]  ? MASTECTOMY Right 2011  ? positive  ? TONSILLECTOMY    ? TOTAL ABDOMINAL HYSTERECTOMY    ? ? ?Social History  ? ?Socioeconomic History  ? Marital status: Married  ?  Spouse name: Not on file  ? Number of children: Not on file  ? Years of education: Not on file  ? Highest education level: Not on file  ?Occupational History  ? Not on file  ?Tobacco Use  ? Smoking  status: Never  ? Smokeless tobacco: Never  ?Substance and Sexual Activity  ? Alcohol use: No  ? Drug use: No  ? Sexual activity: Not Currently  ?Other Topics Concern  ? Not on file  ?Social History Narrative  ? Not on file  ? ?Social Determinants of Health  ? ?Financial Resource Strain: Not on file  ?Food Insecurity: Not on file  ?Transportation Needs: Not on file  ?Physical Activity: Not on file  ?Stress: Not on file  ?Social Connections: Not on file   ?Intimate Partner Violence: Not on file  ? ? ?Family History  ?Problem Relation Age of Onset  ? Heart disease Father   ?     heart attack  ? Breast cancer Neg Hx   ? ? ? ?Current Outpatient Medications:  ?  Calcium Carbonate-Vitamin D 500-125 MG-UNIT TABS, Take by mouth., Disp: , Rfl:  ?  enalapril (VASOTEC) 20 MG tablet, TAKE 1 TABLET TWICE A DAY, Disp: , Rfl:  ?  glucose blood (ONETOUCH VERIO) test strip, , Disp: , Rfl:  ?  insulin aspart (NOVOLOG) 100 UNIT/ML injection, 3-10 Units., Disp: , Rfl:  ?  insulin glargine (LANTUS) 100 UNIT/ML injection, Inject 0.04 mLs (4 Units total) into the skin daily., Disp: 10 mL, Rfl: 0 ?  levothyroxine (SYNTHROID) 25 MCG tablet, Take 1 tablet by mouth daily before breakfast., Disp: , Rfl:  ?  lovastatin (MEVACOR) 20 MG tablet, Take 1 tablet by mouth daily., Disp: , Rfl:  ?  Multiple Vitamins-Minerals ([facility]) TABS, Take 1 tablet by mouth 2 (two) times daily., Disp: , Rfl:  ?  vitamin B-12 (CYANOCOBALAMIN) 100 MCG tablet, Take by mouth., Disp: , Rfl:  ?  denosumab (PROLIA) 60 MG/ML SOLN injection, Inject 60 mg into the skin every 6 (six) months. (Patient not taking: Reported on 03/14/2022), Disp: , Rfl:  ?No current facility-administered medications for this visit. ? ?Facility-Administered Medications Ordered in Other Visits:  ?  0.9 %  sodium chloride infusion, , Intravenous, Once, Corcoran, Melissa C, MD ?  0.9 %  sodium chloride infusion, , Intravenous, Once PRN, Corcoran, Melissa C, MD ?  albuterol (PROVENTIL) (2.5 MG/3ML) 0.083% nebulizer solution 2.5 mg, 2.5 mg, Nebulization, Once PRN, Mike Gip, Melissa C, MD ?  denosumab (PROLIA) injection 60 mg, 60 mg, Subcutaneous, Q6 months, Sindy Guadeloupe, MD, 60 mg at 02/21/21 1111 ?  denosumab (PROLIA) injection 60 mg, 60 mg, Subcutaneous, Q6 months, Sindy Guadeloupe, MD, 60 mg at 03/14/22 1515 ? ?Physical exam:  ?Vitals:  ? 03/14/22 1446  ?BP: 118/69  ?Pulse: 70  ?Resp: 16  ?Temp: 97.9 ?F (36.6 ?C)  ?TempSrc: Oral  ?Weight:  100 lb (45.4 kg)  ? ?Physical Exam ?Constitutional:   ?   General: She is not in acute distress. ?Eyes:  ?   Pupils: Pupils are equal, round, and reactive to light.  ?Cardiovascular:  ?   Rate and Rhythm: Normal rate and regular rhythm.  ?   Heart sounds: Normal heart sounds.  ?Pulmonary:  ?   Effort: Pulmonary effort is normal.  ?   Breath sounds: Normal breath sounds.  ?Abdominal:  ?   General: Bowel sounds are normal.  ?   Palpations: Abdomen is soft.  ?Skin: ?   General: Skin is warm and dry.  ?Neurological:  ?   Mental Status: She is alert and oriented to person, place, and time.  ? Breast exam: she is s/p right mastectomy without reconstruction. No evidence of chest wall recurrence.  No palpable masses in left breast. No palpable left axillary adenopathy ? ? ?  Latest Ref Rng & Units 03/14/2022  ?  1:33 PM  ?CMP  ?Glucose 70 - 99 mg/dL 107    ?BUN 8 - 23 mg/dL 14    ?Creatinine 0.44 - 1.00 mg/dL 0.73    ?Sodium 135 - 145 mmol/L 134    ?Potassium 3.5 - 5.1 mmol/L 4.5    ?Chloride 98 - 111 mmol/L 99    ?CO2 22 - 32 mmol/L 27    ?Calcium 8.9 - 10.3 mg/dL 9.0    ?Total Protein 6.5 - 8.1 g/dL 6.4    ?Total Bilirubin 0.3 - 1.2 mg/dL 0.6    ?Alkaline Phos 38 - 126 U/L 32    ?AST 15 - 41 U/L 32    ?ALT 0 - 44 U/L 22    ? ? ?  Latest Ref Rng & Units 03/14/2022  ?  1:33 PM  ?CBC  ?WBC 4.0 - 10.5 K/uL 9.2    ?Hemoglobin 12.0 - 15.0 g/dL 13.0    ?Hematocrit 36.0 - 46.0 % 39.7    ?Platelets 150 - 400 K/uL 205    ? ? ? ?Assessment and plan- Patient is a 81 y.o. female with prior h/o breast cancer in 2011 here to receive prolia for osteoporosis ? ?H/o breast cancer in 2011- she is >10 years since diagnosis. We discussed if she would like to continue her mammograms at her age. Patient and her husband understand pros and cons and would like to forego mammograms at this time. Clinically no palpable masses in left breast or no palpable adenopathy ? ?H/o osteoporosis: T score <-3. I would therefore like to continue prolia Q6 months  without drug holiday. Also concern for worsening bone health if denosumab is discontinued. She will get prolia today. Cmp and prolia in 6 and 12 months. I will see her in 12 months ?  ?Visit Diagnos

## 2022-04-02 DIAGNOSIS — E119 Type 2 diabetes mellitus without complications: Secondary | ICD-10-CM | POA: Diagnosis not present

## 2022-04-02 DIAGNOSIS — Z961 Presence of intraocular lens: Secondary | ICD-10-CM | POA: Diagnosis not present

## 2022-04-02 DIAGNOSIS — H353120 Nonexudative age-related macular degeneration, left eye, stage unspecified: Secondary | ICD-10-CM | POA: Diagnosis not present

## 2022-04-26 DIAGNOSIS — E039 Hypothyroidism, unspecified: Secondary | ICD-10-CM | POA: Diagnosis not present

## 2022-04-26 DIAGNOSIS — I25118 Atherosclerotic heart disease of native coronary artery with other forms of angina pectoris: Secondary | ICD-10-CM | POA: Diagnosis not present

## 2022-04-26 DIAGNOSIS — I2510 Atherosclerotic heart disease of native coronary artery without angina pectoris: Secondary | ICD-10-CM | POA: Diagnosis not present

## 2022-04-26 DIAGNOSIS — I10 Essential (primary) hypertension: Secondary | ICD-10-CM | POA: Diagnosis not present

## 2022-04-26 DIAGNOSIS — E1065 Type 1 diabetes mellitus with hyperglycemia: Secondary | ICD-10-CM | POA: Diagnosis not present

## 2022-04-26 DIAGNOSIS — F0390 Unspecified dementia without behavioral disturbance: Secondary | ICD-10-CM | POA: Diagnosis not present

## 2022-04-26 DIAGNOSIS — C50919 Malignant neoplasm of unspecified site of unspecified female breast: Secondary | ICD-10-CM | POA: Diagnosis not present

## 2022-05-03 DIAGNOSIS — E1065 Type 1 diabetes mellitus with hyperglycemia: Secondary | ICD-10-CM | POA: Diagnosis not present

## 2022-05-03 DIAGNOSIS — I2510 Atherosclerotic heart disease of native coronary artery without angina pectoris: Secondary | ICD-10-CM | POA: Diagnosis not present

## 2022-05-03 DIAGNOSIS — C50911 Malignant neoplasm of unspecified site of right female breast: Secondary | ICD-10-CM | POA: Diagnosis not present

## 2022-05-03 DIAGNOSIS — M818 Other osteoporosis without current pathological fracture: Secondary | ICD-10-CM | POA: Diagnosis not present

## 2022-05-03 DIAGNOSIS — I10 Essential (primary) hypertension: Secondary | ICD-10-CM | POA: Diagnosis not present

## 2022-05-03 DIAGNOSIS — Z170 Estrogen receptor positive status [ER+]: Secondary | ICD-10-CM | POA: Diagnosis not present

## 2022-05-03 DIAGNOSIS — I4729 Other ventricular tachycardia: Secondary | ICD-10-CM | POA: Diagnosis not present

## 2022-05-11 IMAGING — MG MM DIGITAL DIAGNOSTIC UNILAT*L* W/ TOMO W/ CAD
6 series · 6 of 18 positions shown · non-contrast
Comparison: Previous exam(s).

CLINICAL DATA: Callback for LEFT breast asymmetry. History of RIGHT
breast mastectomy.

EXAM:
DIGITAL DIAGNOSTIC UNILATERAL LEFT MAMMOGRAM WITH TOMO AND CAD

[L XCCL synth-2D]
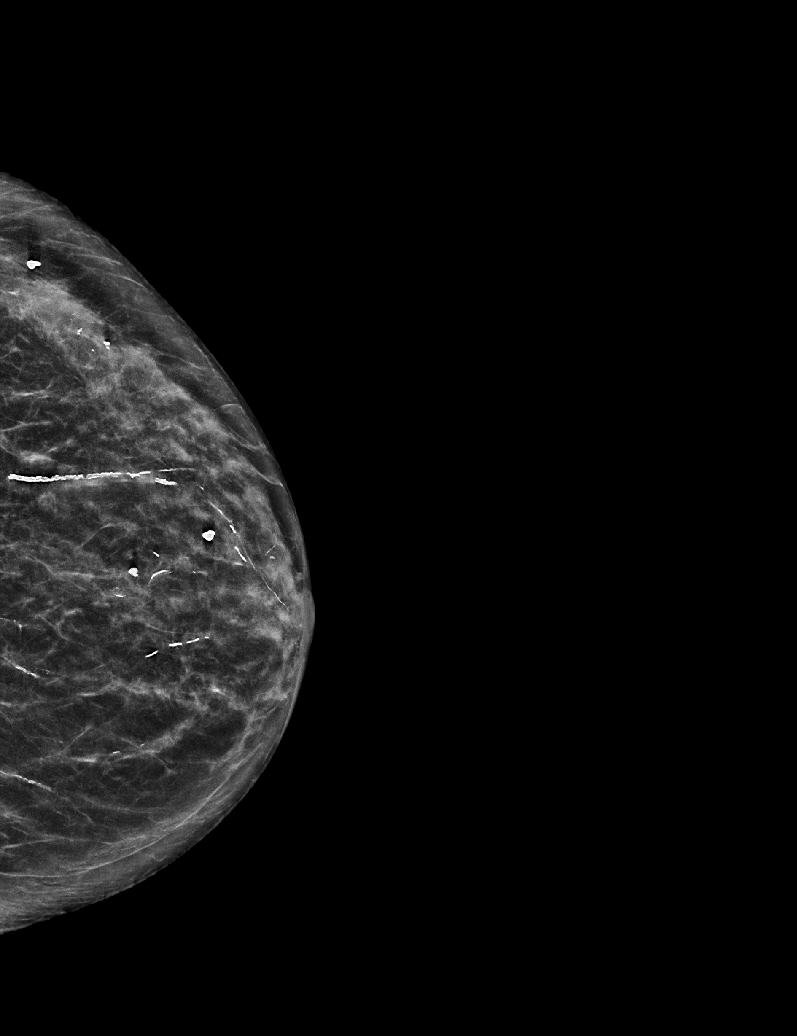

[L MLO synth-2D]
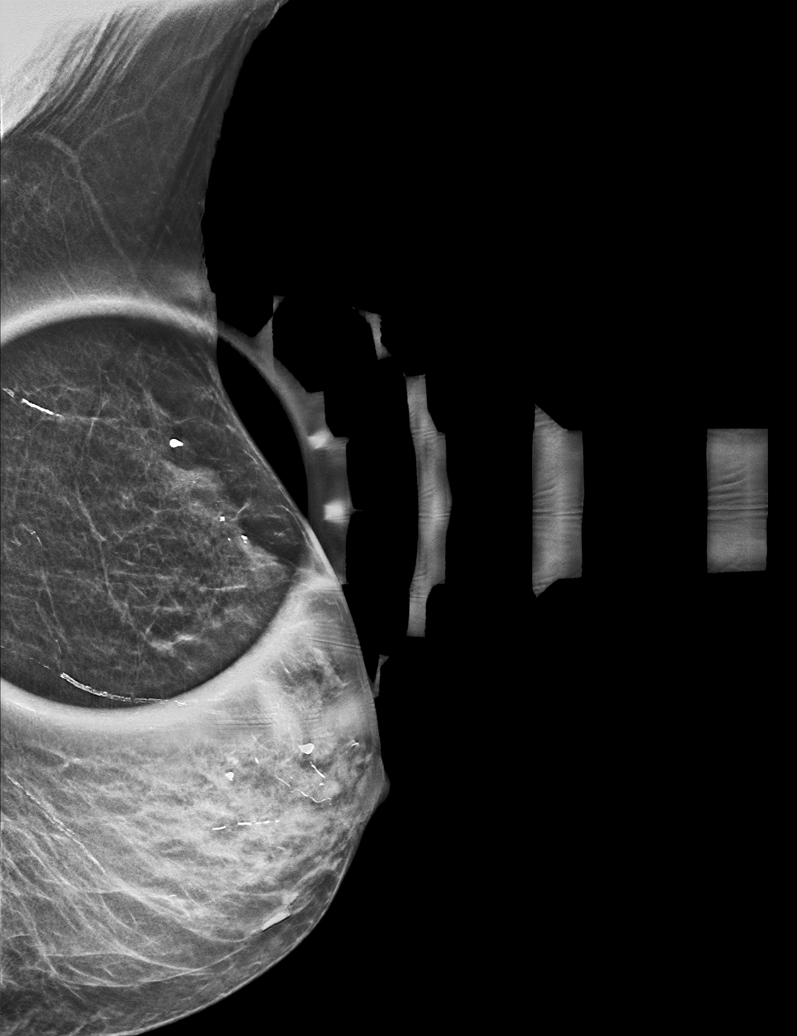

[L ML synth-2D]
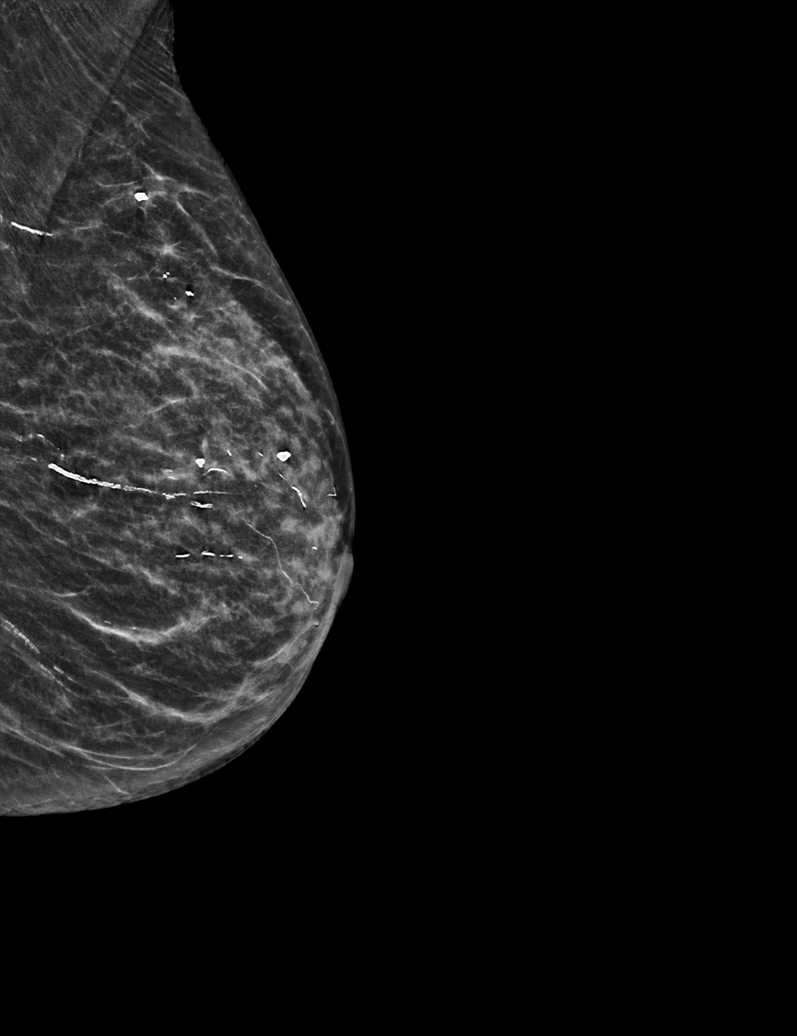

[L ML tomo · tomo slice 25/48.0]
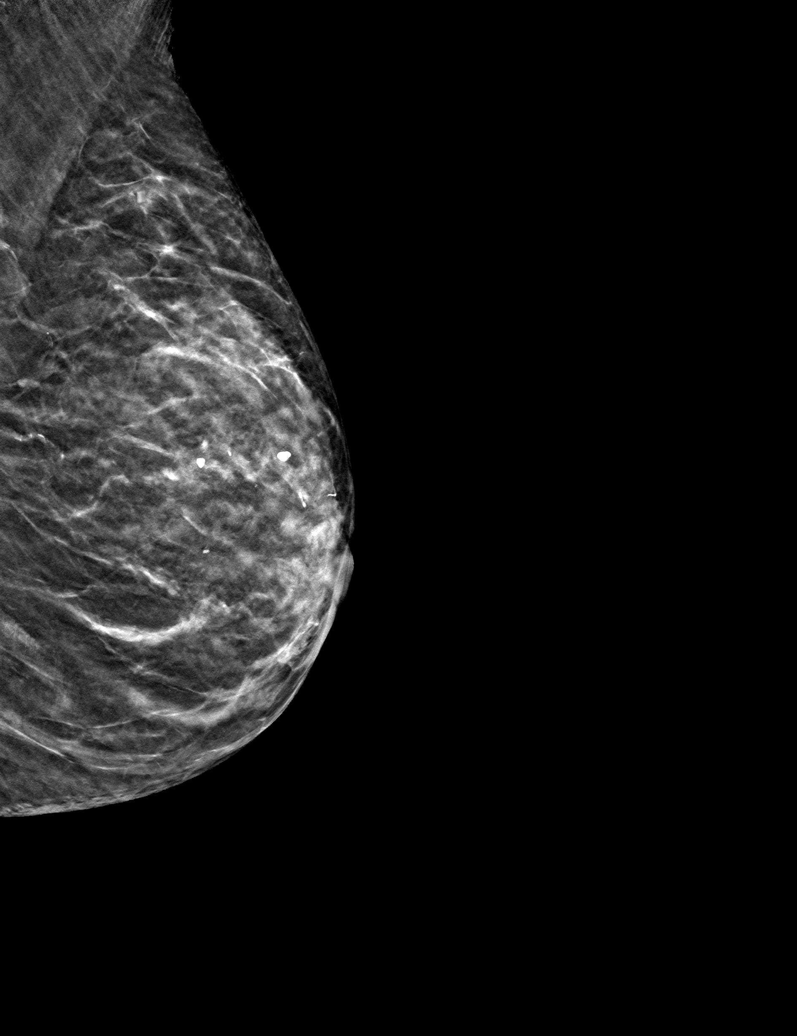

[L MLO tomo · tomo slice 19/36.0]
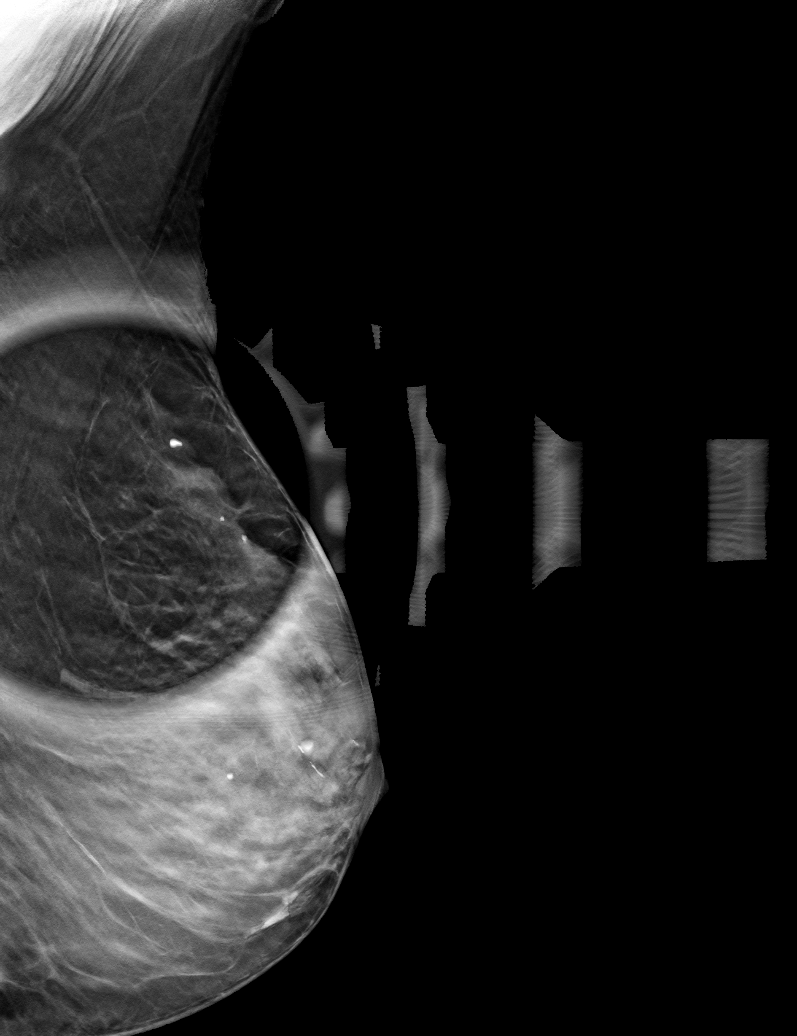

[L XCCL tomo · tomo slice 27/54.0]
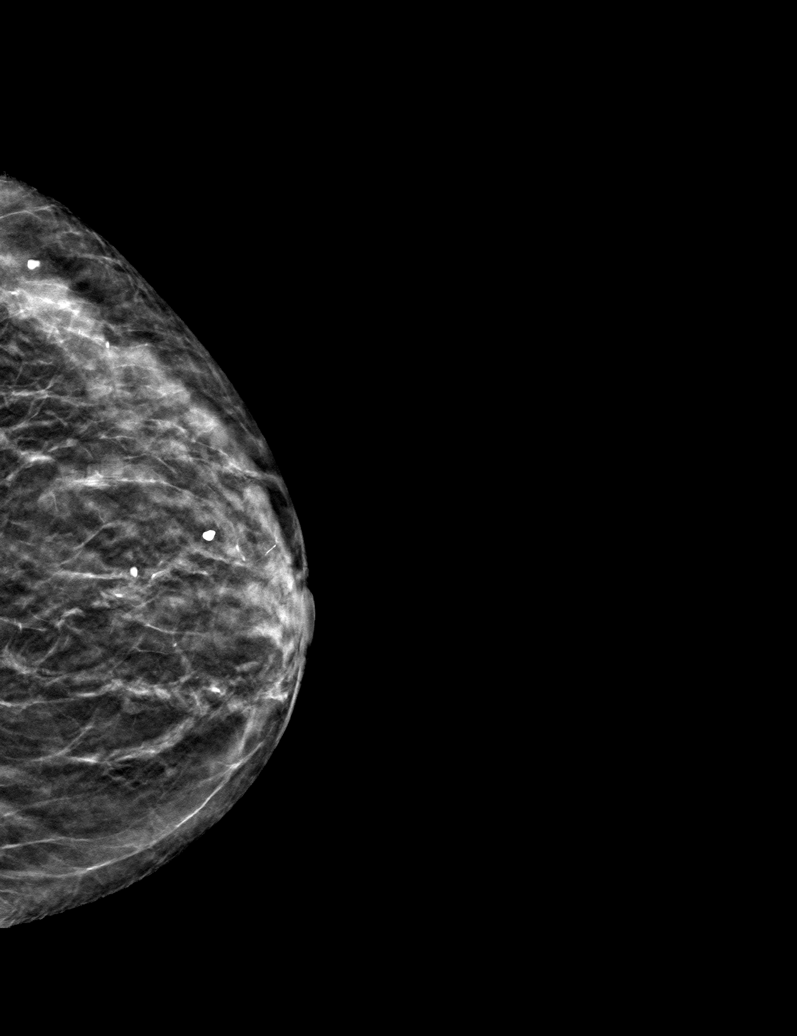

[6 of 18 positions shown; findings below may reference images not displayed]

ACR Breast Density Category c: The breast tissue is heterogeneously
dense, which may obscure small masses.
FINDINGS: The previously described finding does not persist with additional
views, consistent with superimposed fibroglandular tissue. No
suspicious mass, microcalcification, or other finding is identified.
The fibroglandular tissue assumes a configuration compatible with
prior mammograms.

Mammographic images were processed with CAD.
IMPRESSION: No mammographic evidence of malignancy.

RECOMMENDATION:
Screening mammogram in one year.(Code:[id number]-[id number])

I have discussed the findings and recommendations with the patient.
If applicable, a reminder letter will be sent to the patient
regarding the next appointment.

BI-RADS CATEGORY  1: Negative.

## 2022-05-31 ENCOUNTER — Encounter: Payer: Self-pay | Admitting: Hematology and Oncology

## 2022-06-07 ENCOUNTER — Ambulatory Visit (INDEPENDENT_AMBULATORY_CARE_PROVIDER_SITE_OTHER): Payer: PPO | Admitting: Podiatry

## 2022-06-07 ENCOUNTER — Encounter: Payer: Self-pay | Admitting: Podiatry

## 2022-06-07 DIAGNOSIS — Q828 Other specified congenital malformations of skin: Secondary | ICD-10-CM

## 2022-06-07 DIAGNOSIS — N179 Acute kidney failure, unspecified: Secondary | ICD-10-CM

## 2022-06-07 DIAGNOSIS — M79675 Pain in left toe(s): Secondary | ICD-10-CM | POA: Diagnosis not present

## 2022-06-07 DIAGNOSIS — M79674 Pain in right toe(s): Secondary | ICD-10-CM | POA: Diagnosis not present

## 2022-06-07 DIAGNOSIS — B351 Tinea unguium: Secondary | ICD-10-CM | POA: Diagnosis not present

## 2022-06-07 DIAGNOSIS — E109 Type 1 diabetes mellitus without complications: Secondary | ICD-10-CM

## 2022-06-07 NOTE — Progress Notes (Signed)
This patient returns to my office for at risk foot care.  This patient requires this care by a professional since this patient will be at risk due to having  Diabetes.    This patient is unable to cut nails herself since the patient cannot reach her nails.These nails are painful walking and wearing shoes.  This patient presents for at risk foot care today.  Patient has multiple calluses both feet.  General Appearance  Alert, conversant and in no acute stress.  Vascular  Dorsalis pedis and posterior tibial  pulses are  weakly palpable  bilaterally.  Capillary return is within normal limits  bilaterally.  Cold feet  bilaterally.Absent digital hair.  Neurologic  Senn-Weinstein monofilament wire test within normal limits  bilaterally. Muscle power within normal limits bilaterally.  Nails Thick disfigured discolored nails with subungual debris  from hallux to fifth toes bilaterally. No evidence of bacterial infection or drainage bilaterally.  Orthopedic  No limitations of motion  feet .  No crepitus or effusions noted.  No bony pathology or digital deformities noted.  Skin  normotropic skin with no porokeratosis noted bilaterally.  No signs of infections or ulcers noted.  Porokeratosis sub 2  B/L, sub 4 left   and sub 4,5 right foot  asymptomatic.  Onychomycosis  Pain in right toes  Pain in left toes Callus forefeet  B/L.  Consent was obtained for treatment procedures.   Mechanical debridement of nails 1-5  bilaterally performed with a nail nipper.  Filed with dremel without incident.   Return office visit    3 months                  Told patient to return for periodic foot care and evaluation due to potential at risk complications.   Phyllis Whitefield DPM  

## 2022-06-25 DIAGNOSIS — E10649 Type 1 diabetes mellitus with hypoglycemia without coma: Secondary | ICD-10-CM | POA: Diagnosis not present

## 2022-07-02 DIAGNOSIS — Z794 Long term (current) use of insulin: Secondary | ICD-10-CM | POA: Diagnosis not present

## 2022-07-02 DIAGNOSIS — M810 Age-related osteoporosis without current pathological fracture: Secondary | ICD-10-CM | POA: Diagnosis not present

## 2022-07-02 DIAGNOSIS — E10649 Type 1 diabetes mellitus with hypoglycemia without coma: Secondary | ICD-10-CM | POA: Diagnosis not present

## 2022-07-19 ENCOUNTER — Encounter: Payer: Self-pay | Admitting: Hematology and Oncology

## 2022-09-05 ENCOUNTER — Encounter: Payer: Self-pay | Admitting: Hematology and Oncology

## 2022-09-13 ENCOUNTER — Inpatient Hospital Stay: Payer: HMO | Attending: Oncology

## 2022-09-13 ENCOUNTER — Inpatient Hospital Stay: Payer: HMO

## 2022-09-13 VITALS — BP 138/79 | HR 42 | Temp 98.0°F | Resp 16

## 2022-09-13 DIAGNOSIS — Z853 Personal history of malignant neoplasm of breast: Secondary | ICD-10-CM | POA: Diagnosis present

## 2022-09-13 DIAGNOSIS — M810 Age-related osteoporosis without current pathological fracture: Secondary | ICD-10-CM | POA: Insufficient documentation

## 2022-09-13 DIAGNOSIS — Z9011 Acquired absence of right breast and nipple: Secondary | ICD-10-CM | POA: Diagnosis not present

## 2022-09-13 LAB — COMPREHENSIVE METABOLIC PANEL
ALT: 14 U/L (ref 0–44)
AST: 29 U/L (ref 15–41)
Albumin: 3.7 g/dL (ref 3.5–5.0)
Alkaline Phosphatase: 32 U/L — ABNORMAL LOW (ref 38–126)
Anion gap: 7 (ref 5–15)
BUN: 21 mg/dL (ref 8–23)
CO2: 29 mmol/L (ref 22–32)
Calcium: 9 mg/dL (ref 8.9–10.3)
Chloride: 103 mmol/L (ref 98–111)
Creatinine, Ser: 0.89 mg/dL (ref 0.44–1.00)
GFR, Estimated: >60 mL/min (ref >60)
Glucose, Bld: 113 mg/dL — ABNORMAL HIGH (ref 70–99)
Potassium: 5 mmol/L (ref 3.5–5.1)
Sodium: 139 mmol/L (ref 135–145)
Total Bilirubin: 0.5 mg/dL (ref 0.3–1.2)
Total Protein: 6.3 g/dL — ABNORMAL LOW (ref 6.5–8.1)

## 2022-09-13 MED ORDER — DENOSUMAB 60 MG/ML ~~LOC~~ SOSY
60.0000 mg | PREFILLED_SYRINGE | SUBCUTANEOUS | Status: DC
  Administered 2022-09-13: 60 mg via SUBCUTANEOUS
  Filled 2022-09-13: qty 1

## 2022-09-13 NOTE — Patient Instructions (Signed)
Denosumab Injection (Osteoporosis) What is this medication? DENOSUMAB (den oh SUE mab) prevents and treats osteoporosis. It works by making your bones stronger and less likely to break (fracture). It is a monoclonal antibody. This medicine may be used for other purposes; ask your health care provider or pharmacist if you have questions. COMMON BRAND NAME(S): Prolia What should I tell my care team before I take this medication? They need to know if you have any of these conditions: Dental or gum disease, or plan to have dental surgery or a tooth pulled Infection Kidney disease Low levels of calcium or vitamin D in your blood On dialysis Poor nutrition Skin conditions Thyroid disease, or have had thyroid or parathyroid surgery Trouble absorbing minerals in your stomach or intestine An unusual or allergic reaction to denosumab, other medications, foods, dyes, or preservatives Pregnant or trying to get pregnant Breastfeeding How should I use this medication? This medication is injected under the skin. It is given by your care team in a hospital or clinic setting. A special MedGuide will be given to you before each treatment. Be sure to read this information carefully each time. Talk to your care team about the use of this medication in children. Special care may be needed. Overdosage: If you think you have taken too much of this medicine contact a poison control center or emergency room at once. NOTE: This medicine is only for you. Do not share this medicine with others. What if I miss a dose? Keep appointments for follow-up doses. It is important not to miss your dose. Call your care team if you are unable to keep an appointment. What may interact with this medication? Do not take this medication with any of the following: Other medications that contain denosumab This medication may also interact with the following: Medications that lower your chance of fighting infection Steroid  medications, such as prednisone or cortisone This list may not describe all possible interactions. Give your health care provider a list of all the medicines, herbs, non-prescription drugs, or dietary supplements you use. Also tell them if you smoke, drink alcohol, or use illegal drugs. Some items may interact with your medicine. What should I watch for while using this medication? Your condition will be monitored carefully while you are receiving this medication. You may need blood work while taking this medication. This medication may increase your risk of getting an infection. Call your care team for advice if you get a fever, chills, sore throat, or other symptoms of a cold or flu. Do not treat yourself. Try to avoid being around people who are sick. Tell your dentist and dental surgeon that you are taking this medication. You should not have major dental surgery while on this medication. See your dentist to have a dental exam and fix any dental problems before starting this medication. Take good care of your teeth while on this medication. Make sure you see your dentist for regular follow-up appointments. You should make sure you get enough calcium and vitamin D while you are taking this medication. Discuss the foods you eat and the vitamins you take with your care team. Talk to your care team if you are pregnant or think you might be pregnant. This medication can cause serious birth defects if taken during pregnancy and for 5 months after the last dose. You will need a negative pregnancy test before starting this medication. Contraception is recommended while taking this medication and for 5 months after the last dose. Your care   team can help you find the option that works for you. Talk to your care team before breastfeeding. Changes to your treatment plan may be needed. What side effects may I notice from receiving this medication? Side effects that you should report to your care team as soon as  possible: Allergic reactions--skin rash, itching, hives, swelling of the face, lips, tongue, or throat Infection--fever, chills, cough, sore throat, wounds that don't heal, pain or trouble when passing urine, general feeling of discomfort or being unwell Low calcium level--muscle pain or cramps, confusion, tingling, or numbness in the hands or feet Osteonecrosis of the jaw--pain, swelling, or redness in the mouth, numbness of the jaw, poor healing after dental work, unusual discharge from the mouth, visible bones in the mouth Severe bone, joint, or muscle pain Skin infection--skin redness, swelling, warmth, or pain Side effects that usually do not require medical attention (report these to your care team if they continue or are bothersome): Back pain Headache Joint pain Muscle pain Pain in the hands, arms, legs, or feet Runny or stuffy nose Sore throat This list may not describe all possible side effects. Call your doctor for medical advice about side effects. You may report side effects to FDA at 1-800-FDA-1088. Where should I keep my medication? This medication is given in a hospital or clinic. It will not be stored at home. NOTE: This sheet is a summary. It may not cover all possible information. If you have questions about this medicine, talk to your doctor, pharmacist, or health care provider.  2023 Elsevier/Gold Standard (2022-03-19 00:00:00)  

## 2022-09-20 ENCOUNTER — Ambulatory Visit: Payer: HMO | Admitting: Podiatry

## 2022-09-20 ENCOUNTER — Encounter: Payer: Self-pay | Admitting: Podiatry

## 2022-09-20 DIAGNOSIS — B351 Tinea unguium: Secondary | ICD-10-CM | POA: Diagnosis not present

## 2022-09-20 DIAGNOSIS — Q828 Other specified congenital malformations of skin: Secondary | ICD-10-CM

## 2022-09-20 DIAGNOSIS — E109 Type 1 diabetes mellitus without complications: Secondary | ICD-10-CM

## 2022-09-20 DIAGNOSIS — N179 Acute kidney failure, unspecified: Secondary | ICD-10-CM

## 2022-09-20 DIAGNOSIS — M79675 Pain in left toe(s): Secondary | ICD-10-CM

## 2022-09-20 DIAGNOSIS — M79674 Pain in right toe(s): Secondary | ICD-10-CM

## 2022-09-20 NOTE — Progress Notes (Deleted)
   Established Patient Office Visit  Subjective   Patient ID: [name] [name], female    DOB: [birth year]  Age: 81 y.o. MRN: [id number]  Chief Complaint  Patient presents with  . Nail Problem    trim    HPI  {History (Optional):23778}  ROS    Objective:     LMP  (LMP Unknown)  {Vitals History (Optional):23777}  Physical Exam   No results found for any visits on 09/20/22.  {Labs (Optional):23779}  The ASCVD Risk score (Arnett DK, et al., 2019) failed to calculate for the following reasons:   The 2019 ASCVD risk score is only valid for ages 9 to 16   The patient has a prior MI or stroke diagnosis    Assessment & Plan:   Problem List Items Addressed This Visit       Endocrine   Type 1 diabetes mellitus without complication ([place])     Musculoskeletal and Integument   Pain due to onychomycosis of toenails of both feet - Primary   Porokeratosis     Genitourinary   AKI (acute kidney injury) ([place])     Return in about 3 months (around 12/21/2022) for nail care.    Gardiner Barefoot, DPM

## 2022-09-20 NOTE — Progress Notes (Deleted)
   Established Patient Office Visit  Subjective   Patient ID: [name] [name], female    DOB: 09/03/1941  Age: 81 y.o. MRN: [id number]  Chief Complaint  Patient presents with   Nail Problem    trim    HPI  {History (Optional):23778}  ROS    Objective:     LMP  (LMP Unknown)  {Vitals History (Optional):23777}  Physical Exam   No results found for any visits on 09/20/22.  {Labs (Optional):23779}  The ASCVD Risk score (Arnett DK, et al., 2019) failed to calculate for the following reasons:   The 2019 ASCVD risk score is only valid for ages 40 to 49   The patient has a prior MI or stroke diagnosis    Assessment & Plan:   Problem List Items Addressed This Visit   None   No follow-ups on file.    Gardiner Barefoot, DPM

## 2022-09-20 NOTE — Progress Notes (Deleted)
Rm 1

## 2022-09-20 NOTE — Progress Notes (Signed)
This patient returns to my office for at risk foot care.  This patient requires this care by a professional since this patient will be at risk due to having  Diabetes.    This patient is unable to cut nails herself since the patient cannot reach her nails.These nails are painful walking and wearing shoes.  This patient presents for at risk foot care today.  Patient has multiple calluses both feet.  General Appearance  Alert, conversant and in no acute stress.  Vascular  Dorsalis pedis and posterior tibial  pulses are  weakly palpable  bilaterally.  Capillary return is within normal limits  bilaterally.  Cold feet  bilaterally.Absent digital hair.  Neurologic  Senn-Weinstein monofilament wire test within normal limits  bilaterally. Muscle power within normal limits bilaterally.  Nails Thick disfigured discolored nails with subungual debris  from hallux to fifth toes bilaterally. No evidence of bacterial infection or drainage bilaterally.  Orthopedic  No limitations of motion  feet .  No crepitus or effusions noted.  No bony pathology or digital deformities noted.  Skin  normotropic skin with no porokeratosis noted bilaterally.  No signs of infections or ulcers noted.  Porokeratosis sub 2  B/L, sub 4 left   and sub 4,5 right foot  asymptomatic.  Onychomycosis  Pain in right toes  Pain in left toes Callus forefeet  B/L.  Consent was obtained for treatment procedures.   Mechanical debridement of nails 1-5  bilaterally performed with a nail nipper.  Filed with dremel without incident.   Return office visit    3 months                  Told patient to return for periodic foot care and evaluation due to potential at risk complications.   Gardiner Barefoot DPM

## 2022-09-20 NOTE — Progress Notes (Deleted)
This patient returns to my office for at risk foot care.  This patient requires this care by a professional since this patient will be at risk due to having  Diabetes.    This patient is unable to cut nails herself since the patient cannot reach her nails.These nails are painful walking and wearing shoes.  This patient presents for at risk foot care today.  Patient has multiple calluses both feet.  General Appearance  Alert, conversant and in no acute stress.  Vascular  Dorsalis pedis and posterior tibial  pulses are  weakly palpable  bilaterally.  Capillary return is within normal limits  bilaterally.  Cold feet  bilaterally.Absent digital hair.  Neurologic  Senn-Weinstein monofilament wire test within normal limits  bilaterally. Muscle power within normal limits bilaterally.  Nails Thick disfigured discolored nails with subungual debris  from hallux to fifth toes bilaterally. No evidence of bacterial infection or drainage bilaterally.  Orthopedic  No limitations of motion  feet .  No crepitus or effusions noted.  No bony pathology or digital deformities noted.  Skin  normotropic skin with no porokeratosis noted bilaterally.  No signs of infections or ulcers noted.  Porokeratosis sub 2  B/L, sub 4 left   and sub 4,5 right foot  asymptomatic.  Onychomycosis  Pain in right toes  Pain in left toes Callus forefeet  B/L.  Consent was obtained for treatment procedures.   Mechanical debridement of nails 1-5  bilaterally performed with a nail nipper.  Filed with dremel without incident.   Return office visit    3 months                  Told patient to return for periodic foot care and evaluation due to potential at risk complications.   Gardiner Barefoot DPM  Subjective:     Patient ID: [name] [name], female   DOB: 11-Oct-1941, 81 y.o.   MRN: [id number]  HPI   Review of Systems     Objective:   Physical Exam     Assessment:     ***onychomycosis  B/L    Porokeratosis  B/L.    Plan:      ***Debride nails with nail nipper and dremel tool.  Debride callus with # 15 blade and dremel tool.

## 2022-10-22 ENCOUNTER — Encounter: Payer: Self-pay | Admitting: Podiatry

## 2022-10-26 ENCOUNTER — Encounter: Payer: Self-pay | Admitting: Hematology and Oncology

## 2022-11-02 IMAGING — CT CT HEAD W/O CM
3 series · 15 of 47 positions shown, 18 images · non-contrast
Comparison: MRI 11/14/2017.  CT 06/18/2016.

CLINICAL DATA: Mental status changes.  Diabetes and hypertension.

EXAM:
CT HEAD WITHOUT CONTRAST
TECHNIQUE: Contiguous axial images were obtained from the base of the skull
through the vertex without intravenous contrast.

[Series 3: head wo · axial · 0.42mm/px · z∈[+191,+321]mm · 9 of 32 slices shown, 12 images]
[im 3/32  brain]
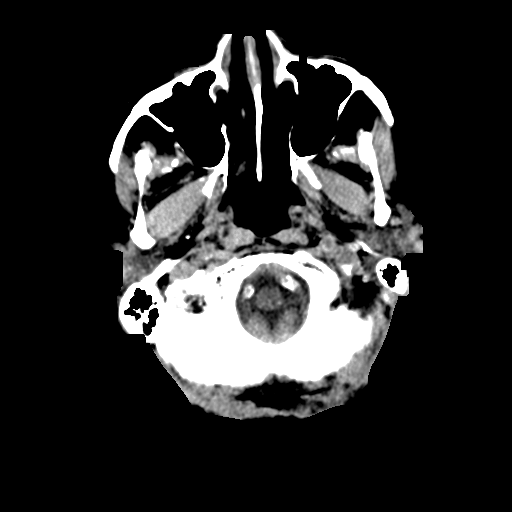
[im 3/32  bone]
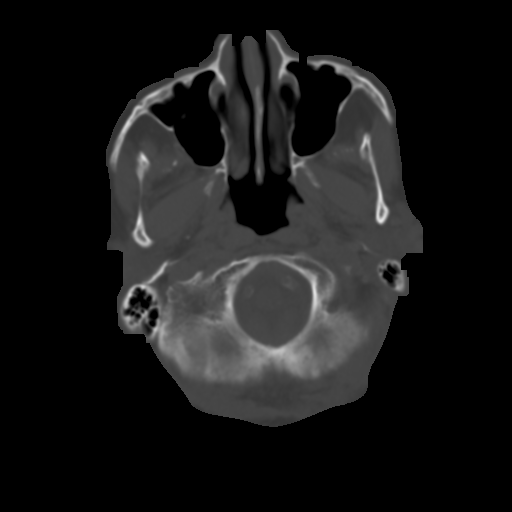
[im 6/32  brain]
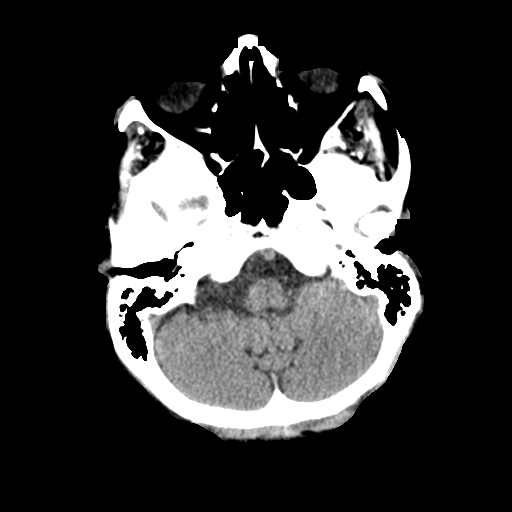
[im 9/32  brain]
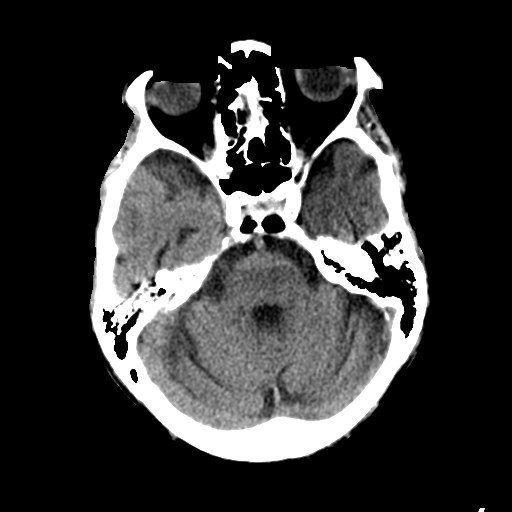
[im 12/32  brain]
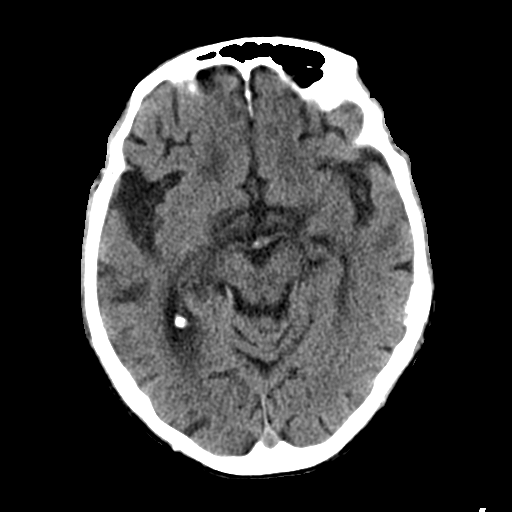
[im 17/32  brain]
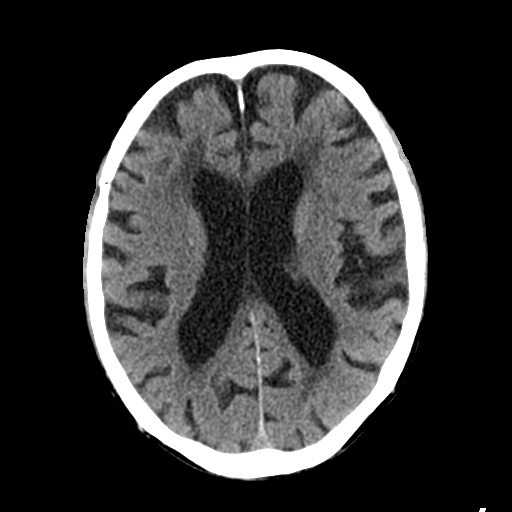
[im 17/32  bone]
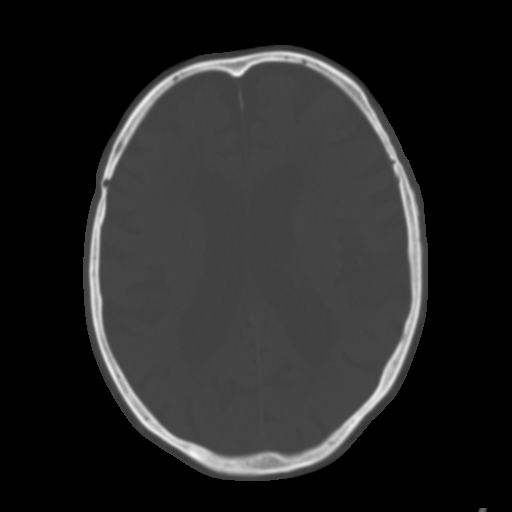
[im 20/32  brain]
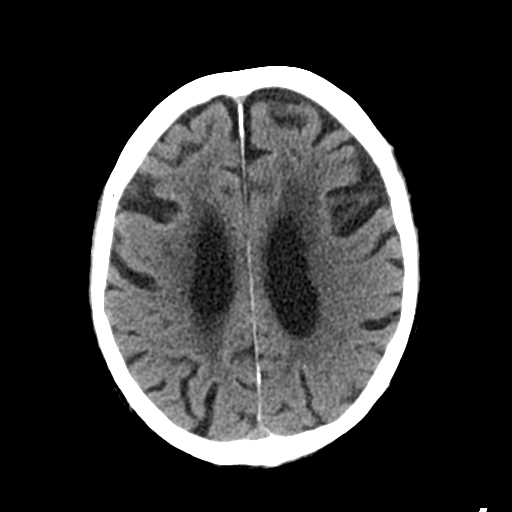
[im 23/32  brain]
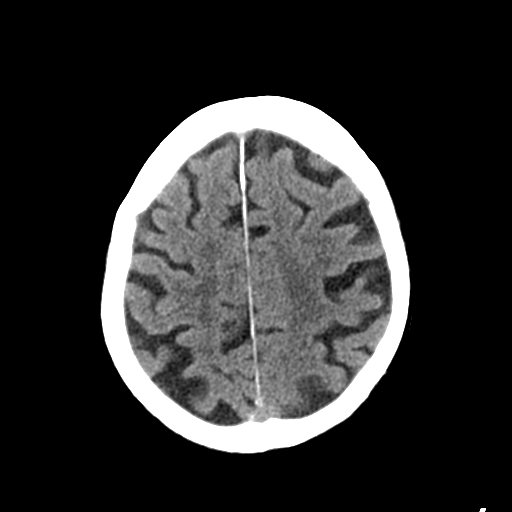
[im 26/32  brain]
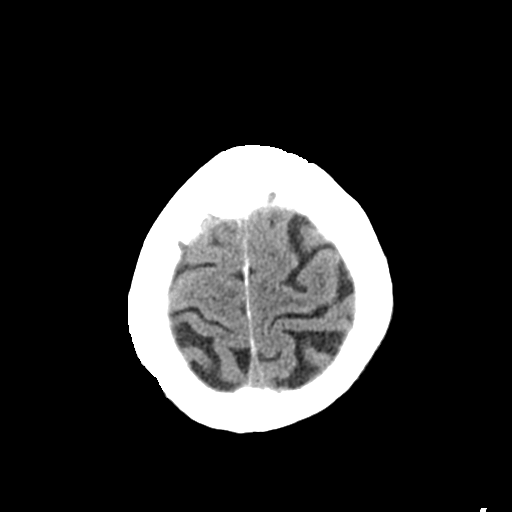
[im 29/32  brain]
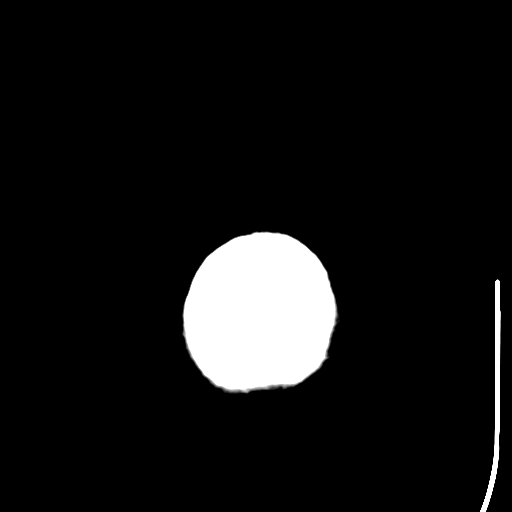
[im 29/32  bone]
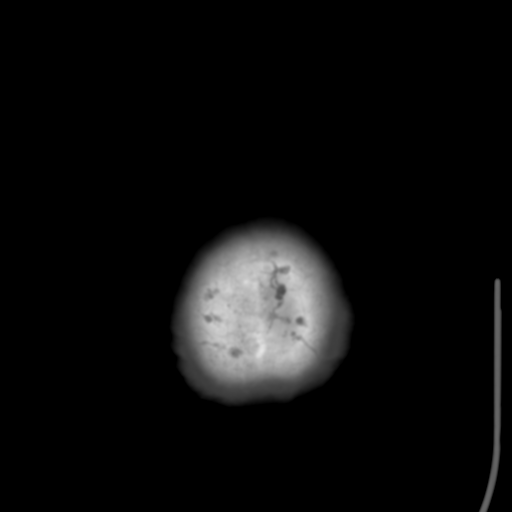

[Series 4: coronal soft tissue · coronal · 0.32mm/px · 3 of 62 slices shown]
[im 21/62  brain]
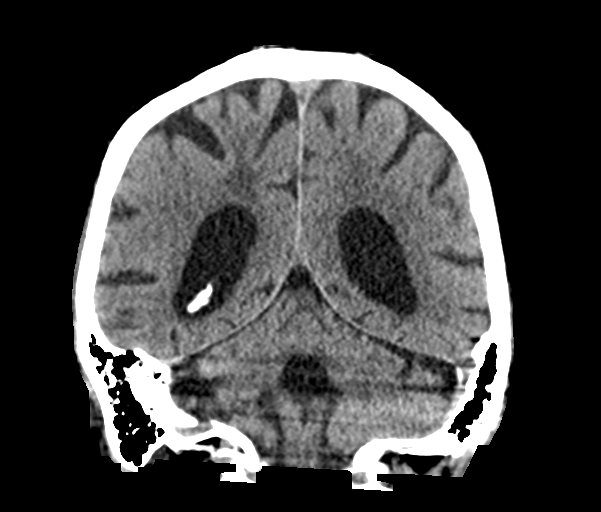
[im 28/62  brain]
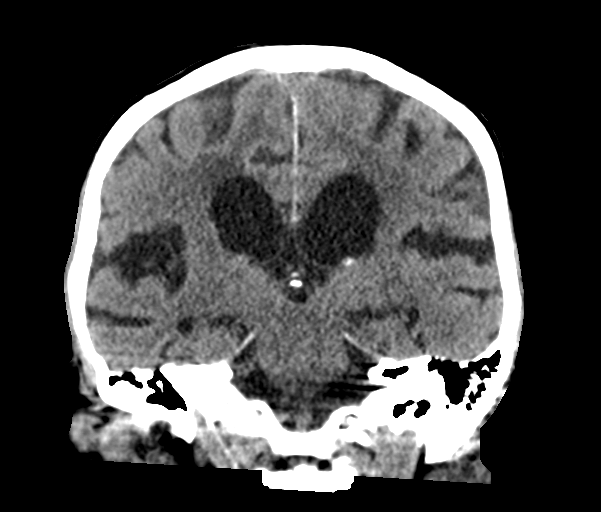
[im 34/62  brain]
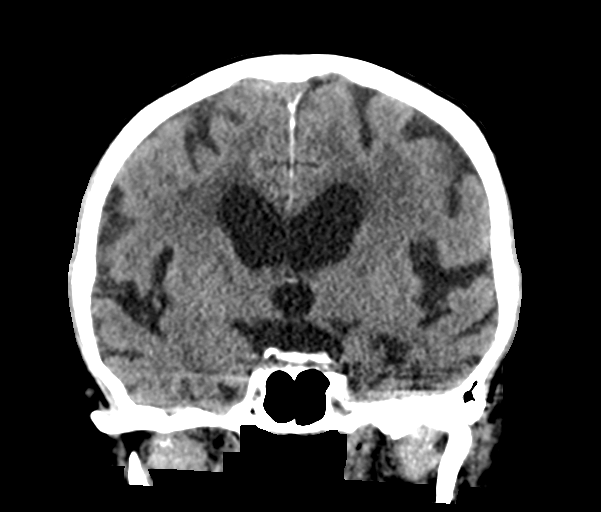

[Series 5: sagittal soft tissue · sagittal · 0.32mm/px · 3 of 52 slices shown]
[im 18/52  brain]
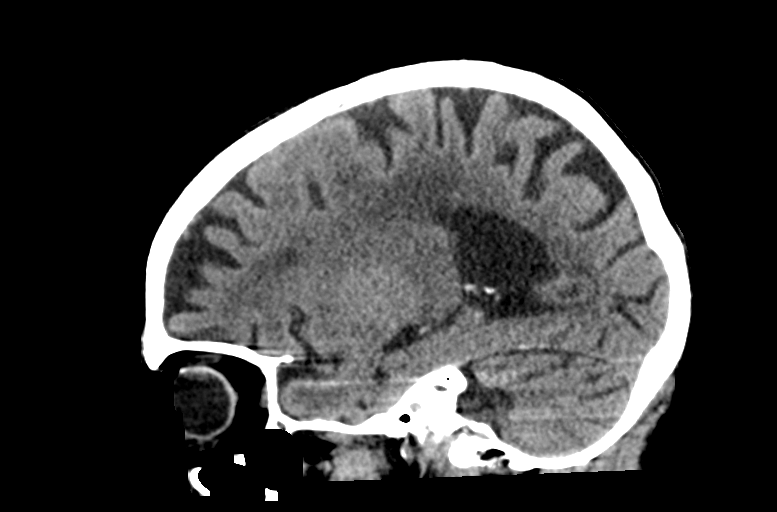
[im 26/52  brain]
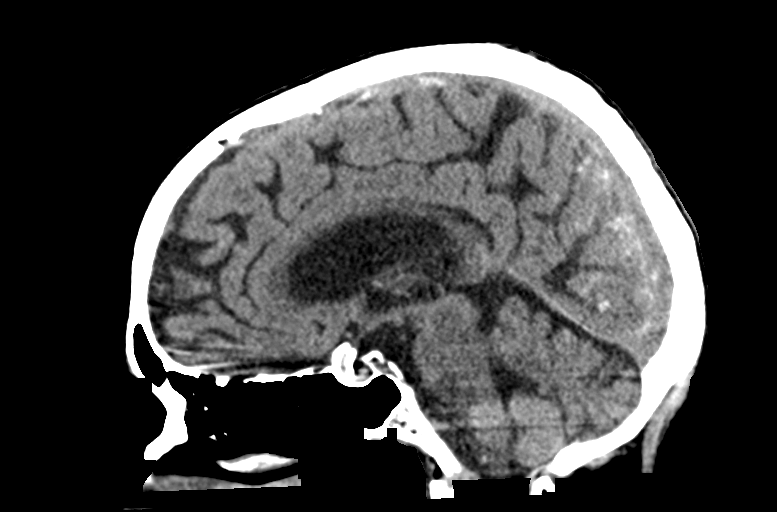
[im 35/52  brain]
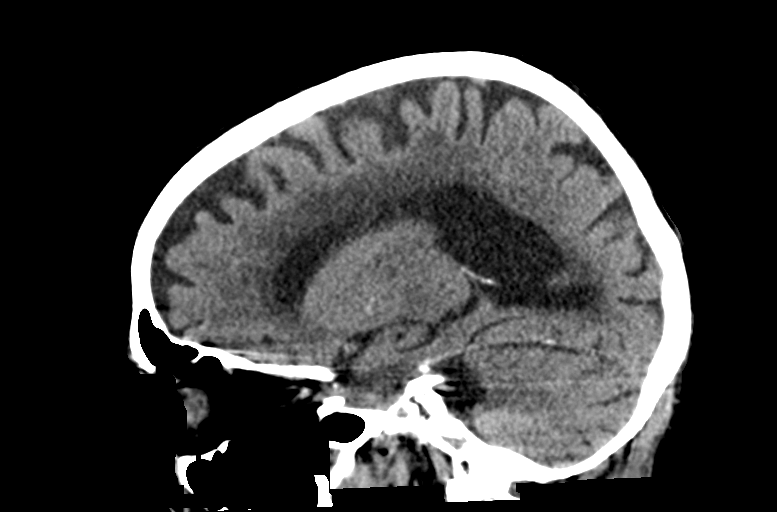

[15 of 47 positions shown; findings below may reference images not displayed]

FINDINGS: Brain: Age related atrophy. Chronic small-vessel ischemic changes of
the hemispheric white matter. No sign of acute infarction, mass
lesion, hemorrhage, hydrocephalus or extra-axial collection.

Vascular: There is atherosclerotic calcification of the major
vessels at the base of the brain.

Skull: Negative

Sinuses/Orbits: Clear/normal

Other: None
IMPRESSION: No acute finding by CT. Age related atrophy and chronic small-vessel
ischemic changes of the white matter.

## 2022-12-18 ENCOUNTER — Encounter: Payer: Self-pay | Admitting: Hematology and Oncology

## 2022-12-18 DIAGNOSIS — R8290 Unspecified abnormal findings in urine: Secondary | ICD-10-CM | POA: Diagnosis not present

## 2022-12-18 DIAGNOSIS — C50911 Malignant neoplasm of unspecified site of right female breast: Secondary | ICD-10-CM | POA: Diagnosis not present

## 2022-12-18 DIAGNOSIS — Z170 Estrogen receptor positive status [ER+]: Secondary | ICD-10-CM | POA: Diagnosis not present

## 2022-12-18 DIAGNOSIS — I10 Essential (primary) hypertension: Secondary | ICD-10-CM | POA: Diagnosis not present

## 2022-12-18 DIAGNOSIS — M818 Other osteoporosis without current pathological fracture: Secondary | ICD-10-CM | POA: Diagnosis not present

## 2022-12-18 DIAGNOSIS — E1065 Type 1 diabetes mellitus with hyperglycemia: Secondary | ICD-10-CM | POA: Diagnosis not present

## 2022-12-25 DIAGNOSIS — F0390 Unspecified dementia without behavioral disturbance: Secondary | ICD-10-CM | POA: Diagnosis not present

## 2022-12-25 DIAGNOSIS — E039 Hypothyroidism, unspecified: Secondary | ICD-10-CM | POA: Diagnosis not present

## 2022-12-25 DIAGNOSIS — E440 Moderate protein-calorie malnutrition: Secondary | ICD-10-CM | POA: Diagnosis not present

## 2022-12-25 DIAGNOSIS — Z170 Estrogen receptor positive status [ER+]: Secondary | ICD-10-CM | POA: Diagnosis not present

## 2022-12-25 DIAGNOSIS — I10 Essential (primary) hypertension: Secondary | ICD-10-CM | POA: Diagnosis not present

## 2022-12-25 DIAGNOSIS — Z0000 Encounter for general adult medical examination without abnormal findings: Secondary | ICD-10-CM | POA: Diagnosis not present

## 2022-12-25 DIAGNOSIS — E1065 Type 1 diabetes mellitus with hyperglycemia: Secondary | ICD-10-CM | POA: Diagnosis not present

## 2022-12-25 DIAGNOSIS — Z9011 Acquired absence of right breast and nipple: Secondary | ICD-10-CM | POA: Diagnosis not present

## 2022-12-25 DIAGNOSIS — C50911 Malignant neoplasm of unspecified site of right female breast: Secondary | ICD-10-CM | POA: Diagnosis not present

## 2022-12-27 ENCOUNTER — Ambulatory Visit: Payer: HMO | Admitting: Podiatry

## 2023-01-03 ENCOUNTER — Encounter: Payer: Self-pay | Admitting: Pulmonary Disease

## 2023-01-03 ENCOUNTER — Inpatient Hospital Stay: Payer: PPO

## 2023-01-03 ENCOUNTER — Emergency Department: Payer: PPO

## 2023-01-03 ENCOUNTER — Inpatient Hospital Stay
Admission: EM | Admit: 2023-01-03 | Discharge: 2023-01-18 | DRG: 871 | Disposition: E | Payer: PPO | Attending: Internal Medicine | Admitting: Internal Medicine

## 2023-01-03 ENCOUNTER — Encounter: Payer: Self-pay | Admitting: Hematology and Oncology

## 2023-01-03 ENCOUNTER — Other Ambulatory Visit: Payer: Self-pay

## 2023-01-03 DIAGNOSIS — R400 Somnolence: Secondary | ICD-10-CM | POA: Diagnosis not present

## 2023-01-03 DIAGNOSIS — M79661 Pain in right lower leg: Secondary | ICD-10-CM

## 2023-01-03 DIAGNOSIS — R579 Shock, unspecified: Secondary | ICD-10-CM | POA: Diagnosis not present

## 2023-01-03 DIAGNOSIS — J1282 Pneumonia due to coronavirus disease 2019: Secondary | ICD-10-CM | POA: Diagnosis not present

## 2023-01-03 DIAGNOSIS — Z9011 Acquired absence of right breast and nipple: Secondary | ICD-10-CM

## 2023-01-03 DIAGNOSIS — E7800 Pure hypercholesterolemia, unspecified: Secondary | ICD-10-CM | POA: Diagnosis not present

## 2023-01-03 DIAGNOSIS — Z794 Long term (current) use of insulin: Secondary | ICD-10-CM

## 2023-01-03 DIAGNOSIS — A4189 Other specified sepsis: Secondary | ICD-10-CM | POA: Diagnosis not present

## 2023-01-03 DIAGNOSIS — E1110 Type 2 diabetes mellitus with ketoacidosis without coma: Secondary | ICD-10-CM | POA: Diagnosis present

## 2023-01-03 DIAGNOSIS — F05 Delirium due to known physiological condition: Secondary | ICD-10-CM | POA: Diagnosis present

## 2023-01-03 DIAGNOSIS — I472 Ventricular tachycardia, unspecified: Secondary | ICD-10-CM | POA: Diagnosis not present

## 2023-01-03 DIAGNOSIS — Z7189 Other specified counseling: Secondary | ICD-10-CM | POA: Diagnosis not present

## 2023-01-03 DIAGNOSIS — Z853 Personal history of malignant neoplasm of breast: Secondary | ICD-10-CM | POA: Diagnosis not present

## 2023-01-03 DIAGNOSIS — I672 Cerebral atherosclerosis: Secondary | ICD-10-CM | POA: Diagnosis not present

## 2023-01-03 DIAGNOSIS — I10 Essential (primary) hypertension: Secondary | ICD-10-CM | POA: Diagnosis present

## 2023-01-03 DIAGNOSIS — J9690 Respiratory failure, unspecified, unspecified whether with hypoxia or hypercapnia: Secondary | ICD-10-CM | POA: Diagnosis not present

## 2023-01-03 DIAGNOSIS — K760 Fatty (change of) liver, not elsewhere classified: Secondary | ICD-10-CM | POA: Diagnosis not present

## 2023-01-03 DIAGNOSIS — R9431 Abnormal electrocardiogram [ECG] [EKG]: Secondary | ICD-10-CM | POA: Diagnosis not present

## 2023-01-03 DIAGNOSIS — M810 Age-related osteoporosis without current pathological fracture: Secondary | ICD-10-CM | POA: Diagnosis present

## 2023-01-03 DIAGNOSIS — E1011 Type 1 diabetes mellitus with ketoacidosis with coma: Secondary | ICD-10-CM | POA: Diagnosis not present

## 2023-01-03 DIAGNOSIS — R000 Tachycardia, unspecified: Secondary | ICD-10-CM | POA: Diagnosis not present

## 2023-01-03 DIAGNOSIS — Z882 Allergy status to sulfonamides status: Secondary | ICD-10-CM

## 2023-01-03 DIAGNOSIS — I6523 Occlusion and stenosis of bilateral carotid arteries: Secondary | ICD-10-CM | POA: Diagnosis not present

## 2023-01-03 DIAGNOSIS — R34 Anuria and oliguria: Secondary | ICD-10-CM | POA: Diagnosis not present

## 2023-01-03 DIAGNOSIS — Z79890 Hormone replacement therapy (postmenopausal): Secondary | ICD-10-CM | POA: Diagnosis not present

## 2023-01-03 DIAGNOSIS — M79662 Pain in left lower leg: Secondary | ICD-10-CM

## 2023-01-03 DIAGNOSIS — Z66 Do not resuscitate: Secondary | ICD-10-CM | POA: Diagnosis not present

## 2023-01-03 DIAGNOSIS — A419 Sepsis, unspecified organism: Secondary | ICD-10-CM | POA: Diagnosis not present

## 2023-01-03 DIAGNOSIS — E039 Hypothyroidism, unspecified: Secondary | ICD-10-CM | POA: Diagnosis present

## 2023-01-03 DIAGNOSIS — Z90710 Acquired absence of both cervix and uterus: Secondary | ICD-10-CM

## 2023-01-03 DIAGNOSIS — M7989 Other specified soft tissue disorders: Secondary | ICD-10-CM

## 2023-01-03 DIAGNOSIS — Z961 Presence of intraocular lens: Secondary | ICD-10-CM | POA: Diagnosis not present

## 2023-01-03 DIAGNOSIS — Z515 Encounter for palliative care: Secondary | ICD-10-CM

## 2023-01-03 DIAGNOSIS — R0689 Other abnormalities of breathing: Secondary | ICD-10-CM | POA: Diagnosis not present

## 2023-01-03 DIAGNOSIS — R55 Syncope and collapse: Secondary | ICD-10-CM | POA: Diagnosis not present

## 2023-01-03 DIAGNOSIS — I959 Hypotension, unspecified: Secondary | ICD-10-CM | POA: Diagnosis not present

## 2023-01-03 DIAGNOSIS — R7989 Other specified abnormal findings of blood chemistry: Secondary | ICD-10-CM | POA: Diagnosis present

## 2023-01-03 DIAGNOSIS — R578 Other shock: Secondary | ICD-10-CM | POA: Diagnosis present

## 2023-01-03 DIAGNOSIS — R064 Hyperventilation: Secondary | ICD-10-CM | POA: Diagnosis not present

## 2023-01-03 DIAGNOSIS — R4182 Altered mental status, unspecified: Secondary | ICD-10-CM | POA: Diagnosis not present

## 2023-01-03 DIAGNOSIS — U071 COVID-19: Secondary | ICD-10-CM | POA: Diagnosis present

## 2023-01-03 DIAGNOSIS — R739 Hyperglycemia, unspecified: Secondary | ICD-10-CM | POA: Diagnosis not present

## 2023-01-03 DIAGNOSIS — I219 Acute myocardial infarction, unspecified: Secondary | ICD-10-CM | POA: Diagnosis not present

## 2023-01-03 DIAGNOSIS — Z79899 Other long term (current) drug therapy: Secondary | ICD-10-CM | POA: Diagnosis not present

## 2023-01-03 DIAGNOSIS — R404 Transient alteration of awareness: Secondary | ICD-10-CM | POA: Diagnosis not present

## 2023-01-03 DIAGNOSIS — Z888 Allergy status to other drugs, medicaments and biological substances status: Secondary | ICD-10-CM

## 2023-01-03 DIAGNOSIS — Z8249 Family history of ischemic heart disease and other diseases of the circulatory system: Secondary | ICD-10-CM

## 2023-01-03 LAB — RESP PANEL BY RT-PCR (RSV, FLU A&B, COVID)  RVPGX2
Influenza A by PCR: NEGATIVE
Influenza B by PCR: NEGATIVE
Resp Syncytial Virus by PCR: NEGATIVE
SARS Coronavirus 2 by RT PCR: POSITIVE — AB

## 2023-01-03 LAB — GLUCOSE, CAPILLARY
Glucose-Capillary: 129 mg/dL — ABNORMAL HIGH (ref 70–99)
Glucose-Capillary: 129 mg/dL — ABNORMAL HIGH (ref 70–99)
Glucose-Capillary: 131 mg/dL — ABNORMAL HIGH (ref 70–99)
Glucose-Capillary: 307 mg/dL — ABNORMAL HIGH (ref 70–99)
Glucose-Capillary: 307 mg/dL — ABNORMAL HIGH (ref 70–99)
Glucose-Capillary: 344 mg/dL — ABNORMAL HIGH (ref 70–99)
Glucose-Capillary: 378 mg/dL — ABNORMAL HIGH (ref 70–99)
Glucose-Capillary: 429 mg/dL — ABNORMAL HIGH (ref 70–99)
Glucose-Capillary: 456 mg/dL — ABNORMAL HIGH (ref 70–99)
Glucose-Capillary: 489 mg/dL — ABNORMAL HIGH (ref 70–99)
Glucose-Capillary: 520 mg/dL (ref 70–99)
Glucose-Capillary: 580 mg/dL (ref 70–99)
Glucose-Capillary: 585 mg/dL (ref 70–99)
Glucose-Capillary: >600 mg/dL (ref 70–99)
Glucose-Capillary: >600 mg/dL (ref 70–99)
Glucose-Capillary: >600 mg/dL (ref 70–99)
Glucose-Capillary: >600 mg/dL (ref 70–99)
Glucose-Capillary: >600 mg/dL (ref 70–99)
Glucose-Capillary: >600 mg/dL (ref 70–99)

## 2023-01-03 LAB — CBC WITH DIFFERENTIAL/PLATELET
Abs Immature Granulocytes: 1.03 10*3/uL — ABNORMAL HIGH (ref 0.00–0.07)
Basophils Absolute: 0.1 10*3/uL (ref 0.0–0.1)
Basophils Relative: 1 %
Eosinophils Absolute: 0 10*3/uL (ref 0.0–0.5)
Eosinophils Relative: 0 %
HCT: 35.5 % — ABNORMAL LOW (ref 36.0–46.0)
Hemoglobin: 10.4 g/dL — ABNORMAL LOW (ref 12.0–15.0)
Immature Granulocytes: 4 %
Lymphocytes Relative: 19 %
Lymphs Abs: 4.3 10*3/uL — ABNORMAL HIGH (ref 0.7–4.0)
MCH: 31.7 pg (ref 26.0–34.0)
MCHC: 29.3 g/dL — ABNORMAL LOW (ref 30.0–36.0)
MCV: 108.2 fL — ABNORMAL HIGH (ref 80.0–100.0)
Monocytes Absolute: 2 10*3/uL — ABNORMAL HIGH (ref 0.1–1.0)
Monocytes Relative: 9 %
Neutro Abs: 15.8 10*3/uL — ABNORMAL HIGH (ref 1.7–7.7)
Neutrophils Relative %: 67 %
Platelets: 240 10*3/uL (ref 150–400)
RBC: 3.28 MIL/uL — ABNORMAL LOW (ref 3.87–5.11)
RDW: 14.5 % (ref 11.5–15.5)
WBC: 23.3 10*3/uL — ABNORMAL HIGH (ref 4.0–10.5)
nRBC: 0.1 % (ref 0.0–0.2)

## 2023-01-03 LAB — BASIC METABOLIC PANEL
Anion gap: 23 — ABNORMAL HIGH (ref 5–15)
Anion gap: 18 — ABNORMAL HIGH (ref 5–15)
Anion gap: 17 — ABNORMAL HIGH (ref 5–15)
BUN: 50 mg/dL — ABNORMAL HIGH (ref 8–23)
BUN: 48 mg/dL — ABNORMAL HIGH (ref 8–23)
BUN: 49 mg/dL — ABNORMAL HIGH (ref 8–23)
CO2: 15 mmol/L — ABNORMAL LOW (ref 22–32)
CO2: 20 mmol/L — ABNORMAL LOW (ref 22–32)
CO2: 22 mmol/L (ref 22–32)
Calcium: 7.9 mg/dL — ABNORMAL LOW (ref 8.9–10.3)
Calcium: 7.8 mg/dL — ABNORMAL LOW (ref 8.9–10.3)
Calcium: 7.6 mg/dL — ABNORMAL LOW (ref 8.9–10.3)
Chloride: 97 mmol/L — ABNORMAL LOW (ref 98–111)
Chloride: 97 mmol/L — ABNORMAL LOW (ref 98–111)
Chloride: 95 mmol/L — ABNORMAL LOW (ref 98–111)
Creatinine, Ser: 2.68 mg/dL — ABNORMAL HIGH (ref 0.44–1.00)
Creatinine, Ser: 2.64 mg/dL — ABNORMAL HIGH (ref 0.44–1.00)
Creatinine, Ser: 2.63 mg/dL — ABNORMAL HIGH (ref 0.44–1.00)
GFR, Estimated: 17 mL/min — ABNORMAL LOW (ref >60)
GFR, Estimated: 17 mL/min — ABNORMAL LOW (ref >60)
GFR, Estimated: 18 mL/min — ABNORMAL LOW (ref >60)
Glucose, Bld: 891 mg/dL (ref 70–99)
Glucose, Bld: 773 mg/dL (ref 70–99)
Glucose, Bld: 458 mg/dL — ABNORMAL HIGH (ref 70–99)
Potassium: 4.2 mmol/L (ref 3.5–5.1)
Potassium: 3.7 mmol/L (ref 3.5–5.1)
Potassium: 3.6 mmol/L (ref 3.5–5.1)
Sodium: 135 mmol/L (ref 135–145)
Sodium: 135 mmol/L (ref 135–145)
Sodium: 134 mmol/L — ABNORMAL LOW (ref 135–145)

## 2023-01-03 LAB — URINALYSIS, W/ REFLEX TO CULTURE (INFECTION SUSPECTED)
Bilirubin Urine: NEGATIVE
Glucose, UA: >=500 mg/dL — AB
Ketones, ur: 20 mg/dL — AB
Leukocytes,Ua: NEGATIVE
Nitrite: NEGATIVE
Protein, ur: 100 mg/dL — AB
RBC / HPF: >50 RBC/hpf (ref 0–5)
Specific Gravity, Urine: 1.018 (ref 1.005–1.030)
WBC, UA: >50 WBC/hpf (ref 0–5)
pH: 5 (ref 5.0–8.0)

## 2023-01-03 LAB — BLOOD GAS, VENOUS
Acid-base deficit: 21.4 mmol/L — ABNORMAL HIGH (ref 0.0–2.0)
Acid-base deficit: 4.2 mmol/L — ABNORMAL HIGH (ref 0.0–2.0)
Acid-base deficit: 0.8 mmol/L (ref 0.0–2.0)
Bicarbonate: 7.7 mmol/L — ABNORMAL LOW (ref 20.0–28.0)
Bicarbonate: 20.1 mmol/L (ref 20.0–28.0)
Bicarbonate: 25.4 mmol/L (ref 20.0–28.0)
O2 Saturation: 77.9 %
O2 Saturation: 79.6 %
O2 Saturation: 28.8 %
Patient temperature: 37
Patient temperature: 37
Patient temperature: 37
pCO2, Ven: 28 mmHg — ABNORMAL LOW (ref 44–60)
pCO2, Ven: 34 mmHg — ABNORMAL LOW (ref 44–60)
pCO2, Ven: 47 mmHg (ref 44–60)
pH, Ven: 7.05 — CL (ref 7.25–7.43)
pH, Ven: 7.38 (ref 7.25–7.43)
pH, Ven: 7.34 (ref 7.25–7.43)
pO2, Ven: 53 mmHg — ABNORMAL HIGH (ref 32–45)
pO2, Ven: 46 mmHg — ABNORMAL HIGH (ref 32–45)
pO2, Ven: <31 mmHg — CL (ref 32–45)

## 2023-01-03 LAB — COMPREHENSIVE METABOLIC PANEL
ALT: 32 U/L (ref 0–44)
AST: 65 U/L — ABNORMAL HIGH (ref 15–41)
Albumin: 3.6 g/dL (ref 3.5–5.0)
Alkaline Phosphatase: 36 U/L — ABNORMAL LOW (ref 38–126)
Anion gap: 30 — ABNORMAL HIGH (ref 5–15)
BUN: 53 mg/dL — ABNORMAL HIGH (ref 8–23)
CO2: 8 mmol/L — ABNORMAL LOW (ref 22–32)
Calcium: 8.9 mg/dL (ref 8.9–10.3)
Chloride: 94 mmol/L — ABNORMAL LOW (ref 98–111)
Creatinine, Ser: 2.9 mg/dL — ABNORMAL HIGH (ref 0.44–1.00)
GFR, Estimated: 16 mL/min — ABNORMAL LOW (ref >60)
Glucose, Bld: 1054 mg/dL (ref 70–99)
Potassium: 6.2 mmol/L — ABNORMAL HIGH (ref 3.5–5.1)
Sodium: 132 mmol/L — ABNORMAL LOW (ref 135–145)
Total Bilirubin: 1.5 mg/dL — ABNORMAL HIGH (ref 0.3–1.2)
Total Protein: 5.9 g/dL — ABNORMAL LOW (ref 6.5–8.1)

## 2023-01-03 LAB — HEPARIN LEVEL (UNFRACTIONATED)
Heparin Unfractionated: <0.1 IU/mL — ABNORMAL LOW (ref 0.30–0.70)
Heparin Unfractionated: 1.06 IU/mL — ABNORMAL HIGH (ref 0.30–0.70)

## 2023-01-03 LAB — URINALYSIS, ROUTINE W REFLEX MICROSCOPIC
Bacteria, UA: NONE SEEN
Bilirubin Urine: NEGATIVE
Glucose, UA: >=500 mg/dL — AB
Ketones, ur: 80 mg/dL — AB
Leukocytes,Ua: NEGATIVE
Nitrite: NEGATIVE
Protein, ur: 30 mg/dL — AB
Specific Gravity, Urine: 1.018 (ref 1.005–1.030)
pH: 5 (ref 5.0–8.0)

## 2023-01-03 LAB — APTT: aPTT: 42 seconds — ABNORMAL HIGH (ref 24–36)

## 2023-01-03 LAB — PROTIME-INR
INR: 1.2 (ref 0.8–1.2)
Prothrombin Time: 15.2 seconds (ref 11.4–15.2)

## 2023-01-03 LAB — MRSA NEXT GEN BY PCR, NASAL: MRSA by PCR Next Gen: NOT DETECTED

## 2023-01-03 LAB — LACTIC ACID, PLASMA
Lactic Acid, Venous: 7.5 mmol/L (ref 0.5–1.9)
Lactic Acid, Venous: 8.5 mmol/L (ref 0.5–1.9)
Lactic Acid, Venous: 6.2 mmol/L (ref 0.5–1.9)

## 2023-01-03 LAB — CBG MONITORING, ED
Glucose-Capillary: >600 mg/dL (ref 70–99)
Glucose-Capillary: >600 mg/dL (ref 70–99)
Glucose-Capillary: >600 mg/dL (ref 70–99)

## 2023-01-03 LAB — BETA-HYDROXYBUTYRIC ACID
Beta-Hydroxybutyric Acid: >8 mmol/L — ABNORMAL HIGH (ref 0.05–0.27)
Beta-Hydroxybutyric Acid: 6.5 mmol/L — ABNORMAL HIGH (ref 0.05–0.27)
Beta-Hydroxybutyric Acid: 0.37 mmol/L — ABNORMAL HIGH (ref 0.05–0.27)

## 2023-01-03 LAB — PHOSPHORUS: Phosphorus: 7.1 mg/dL — ABNORMAL HIGH (ref 2.5–4.6)

## 2023-01-03 LAB — TROPONIN I (HIGH SENSITIVITY)
Troponin I (High Sensitivity): 2241 ng/L (ref <18)
Troponin I (High Sensitivity): >24000 ng/L (ref <18)

## 2023-01-03 LAB — HEMOGLOBIN A1C
Hgb A1c MFr Bld: 6.9 % — ABNORMAL HIGH (ref 4.8–5.6)
Mean Plasma Glucose: 151.33 mg/dL

## 2023-01-03 LAB — TSH: TSH: 11.039 u[IU]/mL — ABNORMAL HIGH (ref 0.350–4.500)

## 2023-01-03 LAB — T4, FREE: Free T4: 1.17 ng/dL — ABNORMAL HIGH (ref 0.61–1.12)

## 2023-01-03 LAB — AMYLASE: Amylase: 284 U/L — ABNORMAL HIGH (ref 28–100)

## 2023-01-03 LAB — LIPASE, BLOOD: Lipase: 147 U/L — ABNORMAL HIGH (ref 11–51)

## 2023-01-03 LAB — MAGNESIUM: Magnesium: 2 mg/dL (ref 1.7–2.4)

## 2023-01-03 MED ORDER — THIAMINE HCL 100 MG/ML IJ SOLN
100.0000 mg | Freq: Every day | INTRAMUSCULAR | Status: DC
  Administered 2023-01-03 – 2023-01-04 (×2): 100 mg via INTRAVENOUS
  Filled 2023-01-03 (×2): qty 2

## 2023-01-03 MED ORDER — LACTATED RINGERS IV BOLUS: 1000.0000 mL | Freq: Once | INTRAVENOUS | Status: DC

## 2023-01-03 MED ORDER — ONDANSETRON HCL 4 MG/2ML IJ SOLN
4.0000 mg | Freq: Once | INTRAMUSCULAR | Status: AC
  Administered 2023-01-03: 4 mg via INTRAVENOUS
  Filled 2023-01-03: qty 2

## 2023-01-03 MED ORDER — POTASSIUM CHLORIDE 10 MEQ/100ML IV SOLN
10.0000 meq | INTRAVENOUS | Status: AC
  Administered 2023-01-03 (×2): 10 meq via INTRAVENOUS
  Filled 2023-01-03 (×2): qty 100

## 2023-01-03 MED ORDER — ACETAMINOPHEN 325 MG RE SUPP
650.0000 mg | Freq: Once | RECTAL | Status: AC
  Administered 2023-01-03: 650 mg via RECTAL
  Filled 2023-01-03: qty 2

## 2023-01-03 MED ORDER — POLYETHYLENE GLYCOL 3350 17 G PO PACK: 17.0000 g | PACK | Freq: Every day | ORAL | Status: DC | PRN

## 2023-01-03 MED ORDER — METRONIDAZOLE 500 MG/100ML IV SOLN
500.0000 mg | Freq: Once | INTRAVENOUS | Status: AC
  Administered 2023-01-03: 500 mg via INTRAVENOUS
  Filled 2023-01-03: qty 100

## 2023-01-03 MED ORDER — INSULIN REGULAR(HUMAN) IN NACL 100-0.9 UT/100ML-% IV SOLN: INTRAVENOUS | Status: DC

## 2023-01-03 MED ORDER — LACTATED RINGERS IV SOLN: INTRAVENOUS | Status: DC

## 2023-01-03 MED ORDER — POTASSIUM CHLORIDE 10 MEQ/100ML IV SOLN
10.0000 meq | INTRAVENOUS | Status: DC
  Filled 2023-01-03 (×2): qty 100

## 2023-01-03 MED ORDER — VASOPRESSIN 20 UNITS/100 ML INFUSION FOR SHOCK
0.0000 [IU]/min | INTRAVENOUS | Status: DC
  Administered 2023-01-03: 0.027 [IU]/min via INTRAVENOUS
  Administered 2023-01-03: 0.03 [IU]/min via INTRAVENOUS
  Administered 2023-01-04: 0.02 [IU]/min via INTRAVENOUS
  Filled 2023-01-03 (×3): qty 100

## 2023-01-03 MED ORDER — NOREPINEPHRINE 16 MG/250ML-% IV SOLN
0.0000 ug/min | INTRAVENOUS | Status: DC
  Administered 2023-01-03: 10 ug/min via INTRAVENOUS
  Administered 2023-01-03: 40 ug/min via INTRAVENOUS
  Administered 2023-01-04: 8 ug/min via INTRAVENOUS
  Filled 2023-01-03 (×4): qty 250

## 2023-01-03 MED ORDER — INSULIN REGULAR(HUMAN) IN NACL 100-0.9 UT/100ML-% IV SOLN
INTRAVENOUS | Status: DC
  Administered 2023-01-03: 6.5 [IU]/h via INTRAVENOUS
  Administered 2023-01-03: 2.4 [IU]/h via INTRAVENOUS
  Filled 2023-01-03 (×2): qty 100

## 2023-01-03 MED ORDER — SODIUM CHLORIDE 0.9 % IV SOLN: 200.0000 mg | Freq: Once | INTRAVENOUS | Status: DC

## 2023-01-03 MED ORDER — LACTATED RINGERS IV BOLUS
1000.0000 mL | Freq: Once | INTRAVENOUS | Status: AC
  Administered 2023-01-03: 1000 mL via INTRAVENOUS

## 2023-01-03 MED ORDER — ORAL CARE MOUTH RINSE: 15.0000 mL | OROMUCOSAL | Status: DC | PRN

## 2023-01-03 MED ORDER — STERILE WATER FOR INJECTION IV SOLN
INTRAVENOUS | Status: DC
  Filled 2023-01-03 (×4): qty 150
  Filled 2023-01-03: qty 1000

## 2023-01-03 MED ORDER — CHLORHEXIDINE GLUCONATE CLOTH 2 % EX PADS
6.0000 | MEDICATED_PAD | Freq: Every day | CUTANEOUS | Status: DC
  Administered 2023-01-04: 6 via TOPICAL

## 2023-01-03 MED ORDER — SODIUM BICARBONATE 8.4 % IV SOLN
INTRAVENOUS | Status: AC
  Administered 2023-01-03: 50 meq via INTRAVENOUS
  Filled 2023-01-03: qty 50

## 2023-01-03 MED ORDER — SODIUM CHLORIDE 0.9 % IV SOLN
2.0000 g | INTRAVENOUS | Status: DC
  Administered 2023-01-04 – 2023-01-05 (×2): 2 g via INTRAVENOUS
  Filled 2023-01-03 (×2): qty 2

## 2023-01-03 MED ORDER — DEXTROSE 50 % IV SOLN: 0.0000 mL | INTRAVENOUS | Status: DC | PRN

## 2023-01-03 MED ORDER — DOCUSATE SODIUM 100 MG PO CAPS: 100.0000 mg | ORAL_CAPSULE | Freq: Two times a day (BID) | ORAL | Status: DC | PRN

## 2023-01-03 MED ORDER — HEPARIN SODIUM (PORCINE) 5000 UNIT/ML IJ SOLN
5000.0000 [IU] | Freq: Three times a day (TID) | INTRAMUSCULAR | Status: DC
  Administered 2023-01-03: 5000 [IU] via SUBCUTANEOUS
  Filled 2023-01-03: qty 1

## 2023-01-03 MED ORDER — VITAMIN C 500 MG PO TABS: 500.0000 mg | ORAL_TABLET | Freq: Every day | ORAL | Status: DC

## 2023-01-03 MED ORDER — DEXTROSE IN LACTATED RINGERS 5 % IV SOLN: INTRAVENOUS | Status: DC

## 2023-01-03 MED ORDER — HEPARIN (PORCINE) 25000 UT/250ML-% IV SOLN: 400.0000 [IU]/h | INTRAVENOUS | Status: DC

## 2023-01-03 MED ORDER — SODIUM CHLORIDE 0.9 % IV SOLN: 100.0000 mg | Freq: Every day | INTRAVENOUS | Status: DC

## 2023-01-03 MED ORDER — POTASSIUM CHLORIDE 10 MEQ/100ML IV SOLN
10.0000 meq | INTRAVENOUS | Status: AC
  Administered 2023-01-03 – 2023-01-04 (×3): 10 meq via INTRAVENOUS
  Filled 2023-01-03 (×3): qty 100

## 2023-01-03 MED ORDER — METHYLPREDNISOLONE SODIUM SUCC 40 MG IJ SOLR
0.5000 mg/kg | Freq: Two times a day (BID) | INTRAMUSCULAR | Status: DC
  Administered 2023-01-03 – 2023-01-04 (×3): 22 mg via INTRAVENOUS
  Filled 2023-01-03 (×3): qty 1

## 2023-01-03 MED ORDER — ZINC SULFATE 220 (50 ZN) MG PO CAPS: 220.0000 mg | ORAL_CAPSULE | Freq: Every day | ORAL | Status: DC

## 2023-01-03 MED ORDER — HEPARIN (PORCINE) 25000 UT/250ML-% IV SOLN
550.0000 [IU]/h | INTRAVENOUS | Status: DC
  Administered 2023-01-03: 550 [IU]/h via INTRAVENOUS
  Filled 2023-01-03: qty 250

## 2023-01-03 MED ORDER — PREDNISONE 10 MG PO TABS: 50.0000 mg | ORAL_TABLET | Freq: Every day | ORAL | Status: DC

## 2023-01-03 MED ORDER — SODIUM CHLORIDE 0.9 % IV SOLN
2.0000 g | Freq: Once | INTRAVENOUS | Status: AC
  Administered 2023-01-03: 2 g via INTRAVENOUS
  Filled 2023-01-03: qty 12.5

## 2023-01-03 MED ORDER — SODIUM BICARBONATE 8.4 % IV SOLN
100.0000 meq | Freq: Once | INTRAVENOUS | Status: AC
  Administered 2023-01-03: 100 meq via INTRAVENOUS
  Filled 2023-01-03: qty 50

## 2023-01-03 MED ORDER — NOREPINEPHRINE 4 MG/250ML-% IV SOLN
0.0000 ug/min | INTRAVENOUS | Status: DC
  Administered 2023-01-03: 10 ug/min via INTRAVENOUS
  Filled 2023-01-03: qty 250

## 2023-01-03 MED ORDER — ALBUTEROL SULFATE (2.5 MG/3ML) 0.083% IN NEBU: 2.5000 mg | INHALATION_SOLUTION | RESPIRATORY_TRACT | Status: DC | PRN

## 2023-01-03 MED ORDER — SODIUM CHLORIDE 0.9 % IV SOLN
250.0000 mL | INTRAVENOUS | Status: DC
  Administered 2023-01-03: 250 mL via INTRAVENOUS

## 2023-01-03 MED ORDER — PANTOPRAZOLE SODIUM 40 MG IV SOLR
40.0000 mg | INTRAVENOUS | Status: DC
  Administered 2023-01-03 – 2023-01-05 (×3): 40 mg via INTRAVENOUS
  Filled 2023-01-03 (×3): qty 10

## 2023-01-03 MED ORDER — VANCOMYCIN HCL IN DEXTROSE 1-5 GM/200ML-% IV SOLN
1000.0000 mg | Freq: Once | INTRAVENOUS | Status: AC
  Administered 2023-01-03: 1000 mg via INTRAVENOUS
  Filled 2023-01-03: qty 200

## 2023-01-03 MED ORDER — HEPARIN BOLUS VIA INFUSION
2600.0000 [IU] | Freq: Once | INTRAVENOUS | Status: AC
  Administered 2023-01-03: 2600 [IU] via INTRAVENOUS
  Filled 2023-01-03: qty 2600

## 2023-01-03 MED ORDER — FOLIC ACID 5 MG/ML IJ SOLN
1.0000 mg | Freq: Every day | INTRAMUSCULAR | Status: DC
  Administered 2023-01-03 – 2023-01-04 (×2): 1 mg via INTRAVENOUS
  Filled 2023-01-03 (×3): qty 0.2

## 2023-01-03 MED ORDER — SODIUM BICARBONATE 8.4 % IV SOLN: 50.0000 meq | Freq: Once | INTRAVENOUS | Status: AC

## 2023-01-03 NOTE — ED Provider Notes (Signed)
[facility] Provider Note    Event Date/Time   First MD Initiated Contact with Patient 01/10/2023 [phone]     (approximate)   History   Hyperglycemia   HPI  [name] [name] is a 82 y.o. female  Type 1 DM; On Lantus 6 units q HS and Humalog three times daily before meals as per sliding scale who comes in for hyperglycemia.  On review of records patient was admitted back in April 2022 for hyperglycemia with hypotension.  According to patient's husband he she has not been feeling well for the past 2 days.  She has not been eating as much is normal.  Then around 9 PM she went to bed and was acting her normal self but when he tried to wake her up around 11 or midnight she was more altered.  Therefore he was waiting to see if she would get better but given she was some remaining altered he called EMS.  No known falls.    Physical Exam   Triage Vital Signs: Blood pressure (!) 95/46, pulse (!) 116, temperature (!) 96.6 F (35.9 C), temperature source Rectal, resp. rate (!) 22, height 5' 4"$  (1.626 m), weight 44 kg, SpO2 100 %.  Most recent vital signs: There were no vitals filed for this visit.   General: Somnolent  CV:  Good peripheral perfusion.  Resp:  Normal effort.  Abd:  No distention.  Other:  Patient is somnolent able to groan but otherwise not following any commands   ED Results / Procedures / Treatments   Labs (all labs ordered are listed, but only abnormal results are displayed) Labs Reviewed  RESP PANEL BY RT-PCR (RSV, FLU A&B, COVID)  [id number] - Abnormal; Notable for the following components:      Result Value   SARS Coronavirus 2 by RT PCR POSITIVE (*)    All other components within normal limits  CBC WITH DIFFERENTIAL/PLATELET - Abnormal; Notable for the following components:   WBC 23.3 (*)    RBC 3.28 (*)    Hemoglobin 10.4 (*)    HCT 35.5 (*)    MCV 108.2 (*)    MCHC 29.3 (*)    Neutro Abs 15.8 (*)    Lymphs Abs 4.3 (*)    Monocytes  Absolute 2.0 (*)    Abs Immature Granulocytes 1.03 (*)    All other components within normal limits  URINALYSIS, ROUTINE W REFLEX MICROSCOPIC - Abnormal; Notable for the following components:   Color, Urine YELLOW (*)    APPearance HAZY (*)    Glucose, UA >=500 (*)    Hgb urine dipstick SMALL (*)    Ketones, ur 80 (*)    Protein, ur 30 (*)    All other components within normal limits  BLOOD GAS, VENOUS - Abnormal; Notable for the following components:   pH, Ven 7.05 (*)    pCO2, Ven 28 (*)    pO2, Ven 53 (*)    Bicarbonate 7.7 (*)    Acid-base deficit 21.4 (*)    All other components within normal limits  LACTIC ACID, PLASMA - Abnormal; Notable for the following components:   Lactic Acid, Venous 7.5 (*)    All other components within normal limits  CBG MONITORING, ED - Abnormal; Notable for the following components:   Glucose-Capillary >600 (*)    All other components within normal limits  CULTURE, BLOOD (ROUTINE X 2)  CULTURE, BLOOD (ROUTINE X 2)  BETA-HYDROXYBUTYRIC ACID  BETA-HYDROXYBUTYRIC ACID  BETA-HYDROXYBUTYRIC ACID  COMPREHENSIVE METABOLIC PANEL  TSH  T4, FREE  LACTIC ACID, PLASMA  BASIC METABOLIC PANEL  BASIC METABOLIC PANEL  BASIC METABOLIC PANEL  BASIC METABOLIC PANEL  HEMOGLOBIN A1C  CBG MONITORING, ED  TROPONIN I (HIGH SENSITIVITY)  TROPONIN I (HIGH SENSITIVITY)     EKG  My interpretation of EKG:  Looks sinus tachycardia rate of 109 without any ST elevation but T wave inversions in 2 3 aVF V4 through V6, normal intervals, some ST depressions as well in the inferior lateral leads  RADIOLOGY I have reviewed the xray personally and interpreted and no pneumonia   PROCEDURES:  Critical Care performed: yes   .1-3 Lead EKG Interpretation  Performed by: Vanessa Commerce, MD Authorized by: Vanessa Aten, MD     Interpretation: abnormal     ECG rate:  116   ECG rate assessment: tachycardic     Rhythm: sinus tachycardia     Ectopy: none      Conduction: normal   .Critical Care  Performed by: Vanessa Eek, MD Authorized by: Vanessa , MD   Critical care provider statement:    Critical care time (minutes):  30   Critical care was necessary to treat or prevent imminent or life-threatening deterioration of the following conditions:  Sepsis and endocrine crisis   Critical care was time spent personally by me on the following activities:  Development of treatment plan with patient or surrogate, discussions with consultants, evaluation of patient's response to treatment, examination of patient, ordering and review of laboratory studies, ordering and review of radiographic studies, ordering and performing treatments and interventions, pulse oximetry, re-evaluation of patient's condition and review of old charts    MEDICATIONS ORDERED IN ED: Medications  0.9 %  sodium chloride infusion (has no administration in time range)  norepinephrine (LEVOPHED) 15m in 2513m(0.016 mg/mL) premix infusion (30 mcg/min Intravenous Rate/Dose Change 01/07/2023 0638)  lactated ringers bolus 1,000 mL (has no administration in time range)    Followed by  lactated ringers bolus 1,000 mL (has no administration in time range)  ceFEPIme (MAXIPIME) 2 g in sodium chloride 0.9 % 100 mL IVPB (0 g Intravenous Stopped 01/11/2023 0558)  metroNIDAZOLE (FLAGYL) IVPB 500 mg (0 mg Intravenous Stopped 01/14/2023 0621)  vancomycin (VANCOCIN) IVPB 1000 mg/200 mL premix (0 mg Intravenous Stopped 01/02/2023 0627)  lactated ringers bolus 1,000 mL (0 mLs Intravenous Stopped 01/14/2023 0621)  lactated ringers bolus 1,000 mL (0 mLs Intravenous Stopped 12/23/2022 0621)  acetaminophen (TYLENOL) suppository 650 mg (650 mg Rectal Given 12/27/2022 0527)  ondansetron (ZOFRAN) injection 4 mg (4 mg Intravenous Given 01/13/2023 0629)  sodium bicarbonate injection 100 mEq (100 mEq Intravenous Given 12/30/2022 [id number]    IMPRESSION / [place] [facility]  I reviewed the triage vital signs and  the nursing notes.   Patient's presentation is most consistent with acute presentation with potential threat to life or bodily function.   Patient comes in somnolent febrile concerning for sepsis sepsis alert called.  Patient is significantly hyperglycemic suspect DKA.  Patient is somnolent.  Prior DNR status husband is at bedside had extensive conversation about goals of care with patient's husband.  We discussed that typically not intubate for airway protection and that there is a risk for her vomiting and aspirating which could lead to death but there is also a high risk for coding during intubation.  He stated that he did not think that she would want to be  intubated.  Therefore we made a decision that intubation would not be what we would do at this time.  He did want other aggressive measures including fluids antibiotics pressors instead.  I was also able to find a document that patient had a goals of care note that stated that she wanted things to be treated if they are treatable but no machines including dialysis, ventilator, feeding tube.   6:53 AM discussed with Jarrett Soho from the lab.  They are having to rerun her CMP due to the sodium and chloride need to be reverified but she did confirm that the potassium was 6.2.  She stated this would not change upon the retest and so that this point I will start insulin even though the CMP is not formally resulted.  Labs show K 6.2  AG 30.  CBC elevated white count Lactate elevated  Covid +  Discussed with ICU- will get CT imaging  Trop positive with abn EKG- suspect demand from COVID/DKA.   Discussed with ICU consider Heparin if CT negative. Pt handed off to ICU for admission  The patient is on the cardiac monitor to evaluate for evidence of arrhythmia and/or significant heart rate changes.      FINAL CLINICAL IMPRESSION(S) / ED DIAGNOSES   Final diagnoses:  Sepsis, due to unspecified organism, unspecified whether acute organ dysfunction  present ([place])  Diabetic ketoacidosis with coma associated with type 1 diabetes mellitus ([place])  COVID-19     Rx / DC Orders   ED Discharge Orders     None        Note:  This document was prepared using Dragon voice recognition software and may include unintentional dictation errors.   Vanessa , MD 12/28/2022 [phone]

## 2023-01-03 NOTE — Progress Notes (Signed)
PHARMACY CONSULT NOTE - FOLLOW UP  Pharmacy Consult for Electrolyte Monitoring and Replacement   Recent Labs: Potassium (mmol/L)  Date Value  12/29/2022 3.6  09/28/2014 4.4   Magnesium (mg/dL)  Date Value  01/04/2023 2.0   Calcium (mg/dL)  Date Value  01/12/2023 7.6 (L)   Calcium, Total (mg/dL)  Date Value  09/28/2014 9.0   Albumin (g/dL)  Date Value  12/31/2022 3.6  09/28/2014 3.8   Phosphorus (mg/dL)  Date Value  01/16/2023 7.1 (H)   Sodium (mmol/L)  Date Value  12/23/2022 134 (L)  09/28/2014 133 (L)     Assessment: 82 year old female admitted with DKA, sepsis, and ACS. In AKI with Scr 2.64 (Baseline Scr < 1). On IV insulin infusion at 3 units/hr. Anion gap initially 30 on admission.  Diet: NPO  Goal of Therapy:  K > 4 while on IV insulin Mag > 2 All other electrolytes within normal limits  Plan:  K+ downtrending 6.2 > 4.2 > 3.7 >. Only 1/2 IV KCL given from last order Will order additional  Kcl IV 10 mEq x 3 runs BMP every 4 hours while on IV insulin   Dorothe Pea, PharmD, BCPS Clinical Pharmacist   01/05/2023 9:34 PM

## 2023-01-03 NOTE — Progress Notes (Signed)
PHARMACY -  BRIEF ANTIBIOTIC NOTE   Pharmacy has received consult(s) for Vancomycin, Cefepime  from an ED provider.  The patient's profile has been reviewed for ht/wt/allergies/indication/available labs.    One time order(s) placed for Vancomycin 1 gm IV X 1 and Cefepime 2 gm IV X 1  Further antibiotics/pharmacy consults should be ordered by admitting physician if indicated.                       Thank you, Gwendlyon Zumbro D 01/15/2023  5:45 AM

## 2023-01-03 NOTE — Progress Notes (Addendum)
Late entering, received pt from ED, pt is unresponsive to stimulation and touch, Pt was moved from cart to bed. Pt receiving  LR bolus, on insulin drip, vaso gtt, levo gtt.  Pt was later started on bicarb gtt. Patient husband was later brought back to room and updated and he was oriented to room and call light plus phone of the unit if needed. He stated understanding. No questions now.

## 2023-01-03 NOTE — Progress Notes (Signed)
ANTICOAGULATION CONSULT NOTE - Initial Consult  Pharmacy Consult for IV heparin Indication: chest pain/ACS  Allergies  Allergen Reactions   Lisinopril Rash and Other (See Comments)    Hyponatremia  Hyponatremia  Hyponatremia    Sulfa Antibiotics Other (See Comments)    possible possible Patient doesn't know    Sulfasalazine     Patient Measurements: Height: 5' 4"$  (162.6 cm) Weight: 44 kg (97 lb) IBW/kg (Calculated) : 54.7 Heparin Dosing Weight: 44 kg  Vital Signs: Temp: 98.2 F (36.8 C) (02/15 1200) Temp Source: Rectal (02/15 0623) BP: 121/77 (02/15 1200) Pulse Rate: 131 (02/15 1200)  Labs: Recent Labs    12/23/2022 0505 12/23/2022 0919  HGB 10.4*  --   HCT 35.5*  --   PLT 240  --   CREATININE 2.90* 2.68*  TROPONINIHS 2,241* >24,000*    Estimated Creatinine Clearance: 11 mL/min (A) (by C-G formula based on SCr of 2.68 mg/dL (H)).   Medical History: Past Medical History:  Diagnosis Date   Breast cancer ([place]) 2011   RT MASTECTOMY   Cancer ([place]) 2011   BREAST CA   diabetes insulin dep    High cholesterol    Hypertension    Hypothyroidism    Osteoporosis    Thyroid disease     Medications:  Not on PTA anticoagulation per my chart review and review of fill history  Assessment: 82 year old female admitted to CCU with sepsis, DKA, and elevated troponins. Pharmacy has been consulted to start IV heparin for ACS.   Baseline labs in process. H&H stable  Goal of Therapy:  Heparin level 0.3-0.7 units/ml Monitor platelets by anticoagulation protocol: Yes   Plan: Follow up baseline heparin level result to guide monitoring of heparin (aPTT vs heparin levels) Give 2600 units bolus x 1 Start heparin infusion at 550 units/hr Check anti-Xa level in 8 hours and daily while on heparin Continue to monitor H&H and platelets  Wynelle Cleveland 12/27/2022,12:55 PM

## 2023-01-03 NOTE — Procedures (Signed)
Central Venous Catheter Insertion Procedure Note  [name] [name]  [id number]  July 26, 1941  Date:01/04/2023  Time:7:23 AM   Provider Performing:Shawnita Krizek L Rust-Chester   Procedure: Insertion of Non-tunneled Central Venous [phone]) with US guidance [id number])   Indication(s) Medication administration  Consent Risks of the procedure as well as the alternatives and risks of each were explained to the patient and/or caregiver.  Consent for the procedure was obtained and is signed in the bedside chart  Anesthesia Topical only with 1% lidocaine   Timeout Verified patient identification, verified procedure, site/side was marked, verified correct patient position, special equipment/implants available, medications/allergies/relevant history reviewed, required imaging and test results available.  Sterile Technique Maximal sterile technique including full sterile barrier drape, hand hygiene, sterile gown, sterile gloves, mask, hair covering, sterile ultrasound probe cover (if used).  Procedure Description Area of catheter insertion was cleaned with chlorhexidine and draped in sterile fashion.  With real-time ultrasound guidance a central venous catheter was placed into the right femoral vein. Nonpulsatile blood flow and easy flushing noted in all ports.  The catheter was sutured in place and sterile dressing applied.  Complications/Tolerance None; patient tolerated the procedure well. Chest X-ray is ordered to verify placement for internal jugular or subclavian cannulation.   Chest x-ray is not ordered for femoral cannulation.  EBL Minimal  Specimen(s) None  Venetia Night, AGACNP-BC Acute Care Nurse Practitioner [facility]   [phone] / [phone] Please see Amion for pager details.

## 2023-01-03 NOTE — Progress Notes (Signed)
Pt is more responsive now compare to this morning.temp of 94 and pt was started on bear hugger and was later Dc when temp normalized.  She respond to pain and stimulation, no eye opening or any verbal responses, she moan with turns. Pupils are are equal and reactive to lights but sluggish. Continue on insulin gtt, levo gtt, vaso gtt, heparin gtt and Bicarb gtt. BP stable. Husband came bact this evening and was updated again.

## 2023-01-03 NOTE — H&P (Signed)
NAME:  [name] [name], MRN:  [id number], DOB:  Oct 04, 1941, LOS: 0 ADMISSION DATE:  01/12/2023, CONSULTATION DATE:  01/04/2023 REFERRING MD:  Dr. Jari Pigg, CHIEF COMPLAINT:  Altered Mental Status   History of Present Illness:  82 yo F presenting to [facility] ED from home via EMS on 12/28/2022 due to hyperglycemia and altered mental status. Husband met with me and shares she got abdominal pain in middle of night got up out of bed to check her blood glucose and it was >600.  She apparently is compliant with her medications.  Husband denies flu like illness sick contacts denies NVD or cough.  She was found to have COVID19+ in DKA.  She is oliguric but on room air. PCCM admission for DKA in shock requiring pressors.    ED course: Upon arrival patient remains altered meeting SIRS criteria with tachycardia, low grade fever, tachypnea & hypotension.  Medications given: acetaminophen 1g, cefepime/ flagyl/ vancomycin, 2 L  LR bolus, levophed drip started Initial Vitals: 100.5, 24, 111, 82/37 & 100% on RA Significant labs: (Labs/ Imaging personally reviewed) I, Domingo Pulse Rust-Chester, AGACNP-BC, personally viewed and interpreted this ECG. EKG Interpretation: Date: 12/21/2022, EKG Time: 05:08, Rate: 109, Rhythm: ST, QRS Axis:  RAD, Intervals: normal, ST/T Wave abnormalities: diffuse ST depression and T wave inversions, Narrative Interpretation: ST with diffuse ST depression & T wave inversions concerning for ischemia   Pertinent  Medical History  Breast Cancer s/p R mastectomy (2011) HLD Hypothyroidism HTN T2DM Significant Hospital Events: Including procedures, antibiotic start and stop dates in addition to other pertinent events       Objective   Blood pressure (!) 85/37, pulse (!) 111, temperature (!) 100.5 F (38.1 C), temperature source Rectal, resp. rate (!) 24, height 5' 4"$  (1.626 m), weight 44 kg, SpO2 100 %.       No intake or output data in the 24 hours ending 01/09/2023 0604 Filed Weights   01/01/2023  0535 01/02/2023 0542  Weight: 44 kg 44 kg    Examination: General: Adult NAD ager appropirate, criticallyill but not in distress acutely ill, lying in bed ion room airNAD HEENT: MM pink/moist, anicteric, atraumatic, neck supple Neuro: A&O x non verbal commands, PERRL GCS9 , MAE CV: s1s2 RRR, PVCs  on monitor, no r/m/g Pulm: Regular, non labored on room air , breath sounds mild rhonchi- GI: soft, non distended , non-tender, bs x 4 GU: foley not in place  Skin:  no rashes/lesions noted Extremities: warm/dry, pulses + 2 R/P, edema noted  Resolved Hospital Problem list     Assessment & Plan:  Diabetic ketoacidosis  PMHx: IDDM -patient with acidemia and shock -complinace issues: -Insulin pump function? - DKA protocol initiated - if pH < 7, consider sodium bicarbonate infusion - Aggressive IV hydration, when blood glucose falls below 250 add D5 to IV fluids - Insulin drip ordered per Endo tool protocol - Strict I/O's: alert provider if UOP < 0.5 mL/kg/hr - Q 4 BMP, closely monitor potassium, replace electrolytes PRN - monitor blood glucose every 1 hour, per Endo tool protocol - trend serum CO2/ AG/ Lactic, f/u A1C - Diabetes coordinator consult - Resume home insulin regiment once appropriate  -Circulatory shock    - possibly related to COVID19 vs DKA vs onther infection , septic workup is in process -elevated lactate continue to trend - MRSA screen negative, continue CEFEPIME for -Blood cultures done  -UA with reflex ordered -no DVT on [place] - Supplemental oxygen as needed, to maintain SpO2 >  90% - f/u cultures, trend lactic/ PCT - Daily CBC, monitor WBC/ fever curve - IVF hydration as needed: LR bolus plus bicar drip - Continue levophed vasopressors to maintain MAP< 65, norepinephrine/vasopressin - Strict I/O's: alert provider if UOP < 0.5 mL/kg/hr - Persistent hypotension consider stress dose steroids (hydrocortisone 50q6 or 100q8)      Best Practice (right click and  "Reselect all SmartList Selections" daily)  Diet/type: NPO DVT prophylaxis: SCD GI prophylaxis: PPI Lines: Central line and Arterial Line Foley:  Yes, and it is still needed Code Status:  DNR Last date of multidisciplinary goals of care discussion [today]  Labs   CBC: Recent Labs  Lab 12/29/2022 0505  WBC 23.3*  NEUTROABS 15.8*  HGB 10.4*  HCT 35.5*  MCV 108.2*  PLT [id number]    Basic Metabolic Panel: No results for input(s): "NA", "K", "CL", "CO2", "GLUCOSE", "BUN", "CREATININE", "CALCIUM", "MG", "PHOS" in the last 168 hours. GFR: CrCl cannot be calculated (Patient's most recent lab result is older than the maximum 21 days allowed.). Recent Labs  Lab 12/30/2022 0505  WBC 23.3*    Liver Function Tests: No results for input(s): "AST", "ALT", "ALKPHOS", "BILITOT", "PROT", "ALBUMIN" in the last 168 hours. No results for input(s): "LIPASE", "AMYLASE" in the last 168 hours. No results for input(s): "AMMONIA" in the last 168 hours.  ABG    Component Value Date/Time   HCO3 7.7 (L) 01/13/2023 0505   ACIDBASEDEF 21.4 (H) 12/22/2022 0505   O2SAT 77.9 12/28/2022 0505     Coagulation Profile: No results for input(s): "INR", "PROTIME" in the last 168 hours.  Cardiac Enzymes: No results for input(s): "CKTOTAL", "CKMB", "CKMBINDEX", "TROPONINI" in the last 168 hours.  HbA1C: Hgb A1c MFr Bld  Date/Time Value Ref Range Status  03/09/2021 02:42 AM 9.0 (H) 4.8 - 5.6 % Final    Comment:    (NOTE) Pre diabetes:          5.7%-6.4%  Diabetes:              >6.4%  Glycemic control for   <7.0% adults with diabetes   03/08/2021 10:55 AM 8.9 (H) 4.8 - 5.6 % Final    Comment:    (NOTE) Pre diabetes:          5.7%-6.4%  Diabetes:              >6.4%  Glycemic control for   <7.0% adults with diabetes     CBG: Recent Labs  Lab 12/25/2022 0500  GLUCAP >600*    Review of Systems:   UTA- patient somnolent, unable to participate in an interview at this time.  Past Medical  History:  She,  has a past medical history of Breast cancer ([place]) (2011), Cancer ([place]) (2011), diabetes insulin dep, High cholesterol, Hypertension, Hypothyroidism, Osteoporosis, and Thyroid disease.   Surgical History:   Past Surgical History:  Procedure Laterality Date   BREAST BIOPSY Right 2011   positive   CATARACT EXTRACTION W/PHACO Right 06/13/2016   Procedure: CATARACT EXTRACTION PHACO AND INTRAOCULAR LENS PLACEMENT (IOC);  Surgeon: Estill Cotta, MD;  Location: [facility] ORS;  Service: Ophthalmology;  Laterality: Right;  [place] 03:07 AP% 27.4 CDE 93.21 fluid pack lot # [id number]   MASTECTOMY Right 2011   positive   TONSILLECTOMY     TOTAL ABDOMINAL HYSTERECTOMY       Social History:   reports that she has never smoked. She has never used smokeless tobacco. She reports that she does not drink alcohol and does  not use drugs.   Family History:  Her family history includes Heart disease in her father. There is no history of Breast cancer.   Allergies Allergies  Allergen Reactions   Lisinopril Rash and Other (See Comments)    Hyponatremia  Hyponatremia  Hyponatremia    Sulfa Antibiotics Other (See Comments)    possible possible Patient doesn't know    Sulfasalazine      Home Medications  Prior to Admission medications   Medication Sig Start Date End Date Taking? Authorizing Provider  Calcium Carbonate-Vitamin D 500-125 MG-UNIT TABS Take by mouth.    [provider]  denosumab (PROLIA) 60 MG/ML SOLN injection Inject 60 mg into the skin every 6 (six) months. 05/14/16   [provider]  enalapril (VASOTEC) 20 MG tablet TAKE 1 TABLET TWICE A DAY 08/25/18   [provider]  glucose blood (ONETOUCH VERIO) test strip  05/08/17   [provider]  insulin aspart (NOVOLOG) 100 UNIT/ML injection 3-10 Units. 12/11/19   [provider]  insulin glargine (LANTUS) 100 UNIT/ML injection Inject 0.04 mLs (4 Units total) into the skin daily. 03/15/21    Wyvonnia Dusky, MD  levothyroxine (SYNTHROID) 25 MCG tablet Take 1 tablet by mouth daily before breakfast. 07/27/21   [provider]  lovastatin (MEVACOR) 20 MG tablet Take 1 tablet by mouth daily. 10/11/21   [provider]  Multiple Vitamins-Minerals (MACUVITE EYE CARE) TABS Take 1 tablet by mouth 2 (two) times daily.    [provider]  vitamin B-12 (CYANOCOBALAMIN) 100 MCG tablet Take by mouth.    [provider]     Critical care provider statement:   Total critical care time: 109 minutes   Performed by: Lanney Gins MD   Critical care time was exclusive of separately billable procedures and treating other patients.   Critical care was necessary to treat or prevent imminent or life-threatening deterioration.   Critical care was time spent personally by me on the following activities: development of treatment plan with patient and/or surrogate as well as nursing, discussions with consultants, evaluation of patient's response to treatment, examination of patient, obtaining history from patient or surrogate, ordering and performing treatments and interventions, ordering and review of laboratory studies, ordering and review of radiographic studies, pulse oximetry and re-evaluation of patient's condition.    Ottie Glazier, M.D.  Pulmonary & Critical Care Medicine

## 2023-01-03 NOTE — Sepsis Progress Note (Signed)
Please redraw lactic acid when IVF resuscitation complete. Thanks.

## 2023-01-03 NOTE — Progress Notes (Signed)
Later entering I spoke to the Diabetic coordinator and stated it's okay to check Blood sugar every hour not  every 30 minutes.

## 2023-01-03 NOTE — Sepsis Progress Note (Addendum)
Following for sepsis monitoring ?

## 2023-01-03 NOTE — Progress Notes (Signed)
PHARMACY CONSULT NOTE - FOLLOW UP  Pharmacy Consult for Electrolyte Monitoring and Replacement   Recent Labs: Potassium (mmol/L)  Date Value  12/30/2022 6.2 (H)  09/28/2014 4.4   Magnesium (mg/dL)  Date Value  03/08/2021 2.0   Calcium (mg/dL)  Date Value  12/27/2022 8.9   Calcium, Total (mg/dL)  Date Value  09/28/2014 9.0   Albumin (g/dL)  Date Value  01/14/2023 3.6  09/28/2014 3.8   Sodium (mmol/L)  Date Value  01/01/2023 132 (L)  09/28/2014 133 (L)     Assessment: 82 year old female admitted with DKA, sepsis, and ACS. In AKI with Scr 2.64 (Baseline Scr < 1). On IV insulin infusion at 3 units/hr. Anion gap initially 30 on admission.  Diet: NPO  Goal of Therapy:  K > 4 while on IV insulin Mag > 2 All other electrolytes within normal limits  Plan:  Initially hyperkalemic, then IV insulin ordered. K+ downtrending 6.2 > 4.2 > 3.7.  Will order Kcl IV 10 mEq x 2 runs BMP every 4 hours while on IV insulin   Wynelle Cleveland ,PharmD Clinical Pharmacist 01/02/2023 9:01 AM

## 2023-01-03 NOTE — Progress Notes (Addendum)
Low urine output reported to Dr and received new order for bolus. See order.

## 2023-01-03 NOTE — Progress Notes (Signed)
Consult for USGPIV for vasopressors. Primary RN states patient may be getting a central line soon, and to hold off on USGPIV for now. Pt has 3 PIV's currently. No USGPIV started.

## 2023-01-03 NOTE — ED Notes (Signed)
Rn aware bed assigned

## 2023-01-03 NOTE — Inpatient Diabetes Management (Addendum)
Inpatient Diabetes Program Recommendations  AACE/ADA: New Consensus Statement on Inpatient Glycemic Control (2015)  Target Ranges:  Prepandial:   less than 140 mg/dL      Peak postprandial:   less than 180 mg/dL (1-2 hours)      Critically ill patients:  140 - 180 mg/dL   Lab Results  Component Value Date   GLUCAP >600 (HH) 12/24/2022   HGBA1C 9.0 (H) 03/09/2021    Latest Reference Range & Units 12/25/2022 05:05  Beta-Hydroxybutyric Acid 0.05 - 0.27 mmol/L >8.00 (H)  Glucose 70 - 99 mg/dL 1,054 (HH)  (HH): Data is critically high (H): Data is abnormally high  Latest Reference Range & Units 01/09/2023 05:05  Sodium 135 - 145 mmol/L 132 (L)  Potassium 3.5 - 5.1 mmol/L 6.2 (H)  Chloride 98 - 111 mmol/L 94 (L)  CO2 22 - 32 mmol/L 8 (L)  Glucose 70 - 99 mg/dL 1,054 (HH)  BUN 8 - 23 mg/dL 53 (H)  Creatinine 0.44 - 1.00 mg/dL 2.90 (H)  Calcium 8.9 - 10.3 mg/dL 8.9  Anion gap 5 - 15  30 (H)  Alkaline Phosphatase 38 - 126 U/L 36 (L)  Albumin 3.5 - 5.0 g/dL 3.6  AST 15 - 41 U/L 65 (H)  ALT 0 - 44 U/L 32  Total Protein 6.5 - 8.1 g/dL 5.9 (L)  Total Bilirubin 0.3 - 1.2 mg/dL 1.5 (H)  GFR, Estimated >60 mL/min 16 (L)  (HH): Data is critically high (L): Data is abnormally low (H): Data is abnormally high  Diabetes history: DM1 Outpatient Diabetes medications: Lantus 6 units q hs, Novolog 3-10 units tid meal coverage Current orders for Inpatient glycemic control: IV insulin  Pending A1c-A1c on 11/08/22 was 7.1.  Inpatient Diabetes Program Recommendations:    Patient admitted + Covid and lab glucose 1054.   Per last office visit note with Endocrinologist Dr. Gabriel Carina 11/15/22 "Type 1 diabetes. Her Hb A1c is 7.1%. She brings in her ReliOn glucometer and her sugars were manually reviewed. Date/time incorrect on the meter. She checks her blood sugars 3 times daily, before meals. Over the last 2 weeks, her blood sugars have ranged from 60 - 225 mg/dl. Not clear when the lows are most likely to  occur. She declines a CGM and insulin pump. They reports she takes insulin as follows: Lantus insulin 6 units at bedtime (8pm).  Humalog insulin: If sugar 69 or lower -- no Humalog insulin If sugar before meal is 70 - 150 -- take 4 units If sugar before the meal is 151-200 -- take 5 units If sugar before the meal is 201-250 -- take 7 units If sugar before the meal is 251-300 -- take 9 units If sugar before the meal is over 301 -- take 10 units"   Will follow and assist as needed while inpatient.  9:25 am Spoke with RN to request mode on Endotool to change to Endo 1 DKA instead of hyperglycemia. RN requested I speak with Dr. Lanney Gins concerning change which DM coordinator discussed and changed mode of therapy to Endo 1 DKA.  Thank you, Nani Gasser. Ad Guttman, RN, MSN, CDE  Diabetes Coordinator Inpatient Glycemic Control Team Team Pager [phone] (8am-5pm) 12/20/2022 9:12 AM

## 2023-01-03 NOTE — Consult Note (Signed)
CARDIOLOGY CONSULT NOTE               Patient ID: [name] [name] MRN: [id number] DOB/AGE: [birth year] 82 y.o.  Admit date: 12/29/2022 Referring Physician Dr Ottie Glazier critical care Primary Physician Dr Tracie Harrier primary Primary Cardiologist [facility] Reason for Consultation elevated troponins possible non-STEMI altered mental status sepsis  HPI: Patient is a 82 year old female presented with DKA sepsis COVID found to have elevated troponins of 24,000 patient had a similar presentation in the past patient has more altered mental status lethargic but arousable low-grade fever hypotension no clear evidence of chest pain EKG minimally abnormal not consistent with a STEMI patient on pressors antibiotics treatment for DKA resting comfortably in bed arousable lethargic weak no evidence of respiratory failure cardiology consultation was requested because of elevated troponins echo pending  Review of systems complete and found to be negative unless listed above     Past Medical History:  Diagnosis Date   Breast cancer ([place]) 2011   RT MASTECTOMY   Cancer ([place]) 2011   BREAST CA   diabetes insulin dep    High cholesterol    Hypertension    Hypothyroidism    Osteoporosis    Thyroid disease     Past Surgical History:  Procedure Laterality Date   BREAST BIOPSY Right 2011   positive   CATARACT EXTRACTION W/PHACO Right 06/13/2016   Procedure: CATARACT EXTRACTION PHACO AND INTRAOCULAR LENS PLACEMENT ([place]);  Surgeon: Estill Cotta, MD;  Location: [facility] ORS;  Service: Ophthalmology;  Laterality: Right;  [place] 03:07 AP% 27.4 CDE 93.21 fluid pack lot # [id number]   MASTECTOMY Right 2011   positive   TONSILLECTOMY     TOTAL ABDOMINAL HYSTERECTOMY      Medications Prior to Admission  Medication Sig Dispense Refill Last Dose   ciprofloxacin (CIPRO) 250 MG tablet Take 250 mg by mouth 2 (two) times daily.      Calcium Carbonate-Vitamin D 500-125 MG-UNIT TABS Take by mouth.       denosumab (PROLIA) 60 MG/ML SOLN injection Inject 60 mg into the skin every 6 (six) months.      enalapril (VASOTEC) 20 MG tablet TAKE 1 TABLET TWICE A DAY      glucose blood (ONETOUCH VERIO) test strip       insulin aspart (NOVOLOG) 100 UNIT/ML injection 3-10 Units.      insulin glargine (LANTUS) 100 UNIT/ML injection Inject 0.04 mLs (4 Units total) into the skin daily. 10 mL 0    levothyroxine (SYNTHROID) 25 MCG tablet Take 1 tablet by mouth daily before breakfast.      lovastatin (MEVACOR) 20 MG tablet Take 1 tablet by mouth daily.      Multiple Vitamins-Minerals (MACUVITE EYE CARE) TABS Take 1 tablet by mouth 2 (two) times daily.      vitamin B-12 (CYANOCOBALAMIN) 100 MCG tablet Take by mouth.      Social History   Socioeconomic History   Marital status: Married    Spouse name: Not on file   Number of children: Not on file   Years of education: Not on file   Highest education level: Not on file  Occupational History   Not on file  Tobacco Use   Smoking status: Never   Smokeless tobacco: Never  Substance and Sexual Activity   Alcohol use: No   Drug use: No   Sexual activity: Not Currently  Other Topics Concern   Not on file  Social History Narrative   Not on file  Social Determinants of Health   Financial Resource Strain: Not on file  Food Insecurity: Not on file  Transportation Needs: Not on file  Physical Activity: Not on file  Stress: Not on file  Social Connections: Not on file  Intimate Partner Violence: Not on file    Family History  Problem Relation Age of Onset   Heart disease Father        heart attack   Breast cancer Neg Hx       Review of systems complete and found to be negative unless listed above      PHYSICAL EXAM  General: Well developed, well nourished, acutely ill HEENT:  Normocephalic and atramatic Neck:  No JVD.  Lungs: Clear bilaterally to auscultation and percussion. Heart: Tachycardia. Normal S1 and S2 without gallops or murmurs.   Abdomen: Bowel sounds are positive, abdomen soft and non-tender  Msk:  Back normal, normal gait. Normal strength and tone for age. Extremities: No clubbing, cyanosis or edema.   Neuro: Alert and oriented X 3.  Lethargic arousable Psych:  Good affect, responds appropriately  Labs:   Lab Results  Component Value Date   WBC 23.3 (H) 01/16/2023   HGB 10.4 (L) 12/20/2022   HCT 35.5 (L) 01/12/2023   MCV 108.2 (H) 01/05/2023   PLT 240 01/13/2023    Recent Labs  Lab 01/10/2023 0505 01/12/2023 0919  NA 132* 135  K 6.2* 4.2  CL 94* 97*  CO2 8* 15*  BUN 53* 50*  CREATININE 2.90* 2.68*  CALCIUM 8.9 7.9*  PROT 5.9*  --   BILITOT 1.5*  --   ALKPHOS 36*  --   ALT 32  --   AST 65*  --   GLUCOSE 1,054* 891*   Lab Results  Component Value Date   TROPONINI 0.10 (HH) 11/13/2017    Lab Results  Component Value Date   CHOL 137 03/09/2021   CHOL 217 (H) 06/19/2016   Lab Results  Component Value Date   HDL 76 03/09/2021   HDL 73 06/19/2016   Lab Results  Component Value Date   LDLCALC 48 03/09/2021   LDLCALC 114 (H) 06/19/2016   Lab Results  Component Value Date   TRIG 64 03/09/2021   TRIG 152 (H) 06/19/2016   Lab Results  Component Value Date   CHOLHDL 1.8 03/09/2021   CHOLHDL 3.0 06/19/2016   No results found for: "LDLDIRECT"    Radiology: CT ABDOMEN PELVIS WO CONTRAST  Result Date: 01/08/2023 CLINICAL DATA:  Sepsis EXAM: CT ABDOMEN AND PELVIS WITHOUT CONTRAST TECHNIQUE: Multidetector CT imaging of the abdomen and pelvis was performed following the standard protocol without IV contrast. RADIATION DOSE REDUCTION: This exam was performed according to the departmental dose-optimization program which includes automated exposure control, adjustment of the mA and/or kV according to patient size and/or use of iterative reconstruction technique. COMPARISON:  None Available. FINDINGS: Lower chest: Peribronchial thickening in lower lobes. Mild basilar atelectasis. Hepatobiliary:  Diffuse low-attenuation liver. Small volume non dependent pneumobilia in the LEFT hepatic lobe. Small volume ascites/peritoneal Pancreas: There is little intra-abdominal fat and small amount of intraperitoneal free fluid which limits evaluation the pancreas. No gross pancreatic abnormality. Spleen: Normal spleen Adrenals/urinary tract: Adrenal glands normal. Kidneys normal on noncontrast exam. No hydronephrosis. Ureters bladder unremarkable Stomach/Bowel: Stomach, duodenum small-bowel normal. No small bowel dilatation or obstruction. The colon and rectosigmoid colon are normal. Vascular/Lymphatic: Abdominal aorta is normal caliber with atherosclerotic calcification. There is no retroperitoneal or periportal lymphadenopathy. No pelvic lymphadenopathy. Reproductive:  Uterus and adnexa unremarkable. Other: No free fluid. Musculoskeletal: No aggressive osseous lesion. IMPRESSION: 1. Peribronchial thickening in the lower lobe suggest bronchitis 2. Small volume ascites/intraperitoneal free fluid. 3. Hepatic steatosis. 4. Pneumobilia in the LEFT hepatic lobe. Query prior sphincterotomy. 5. No bowel obstruction. 6.  Aortic Atherosclerosis (ICD10-I70.0). Electronically Signed   By: Suzy Bouchard M.D.   On: 12/26/2022 08:27   CT HEAD WO CONTRAST (5MM)  Result Date: 12/25/2022 CLINICAL DATA:  Hyperglycemia. EXAM: CT HEAD WITHOUT CONTRAST TECHNIQUE: Contiguous axial images were obtained from the base of the skull through the vertex without intravenous contrast. RADIATION DOSE REDUCTION: This exam was performed according to the departmental dose-optimization program which includes automated exposure control, adjustment of the mA and/or kV according to patient size and/or use of iterative reconstruction technique. COMPARISON:  Head CT 03/08/2021 FINDINGS: Brain: There is no acute intracranial hemorrhage, extra-axial fluid collection, or acute infarct There is background parenchymal volume loss with prominence of the  ventricular system and extra-axial CSF spaces. Gray-white differentiation is preserved patchy hypodensity in the supratentorial white matter likely reflects sequela of underlying chronic small-vessel ischemic change The pituitary and suprasellar region are normal. There is no mass lesion. There is no mass effect or midline shift. Vascular: There is calcification of the bilateral carotid siphons and vertebral arteries. Scattered locules of air in the right masticator space are likely related to IV access. Skull: Normal. Negative for fracture or focal lesion. Sinuses/Orbits: The imaged paranasal sinuses are clear. A right lens implant is noted. The globes and orbits are otherwise unremarkable. Other: None. IMPRESSION: No acute intracranial pathology. Electronically Signed   By: Valetta Mole M.D.   On: 12/31/2022 08:08   DG Chest Portable 1 View  Result Date: 01/02/2023 CLINICAL DATA:  Sepsis EXAM: PORTABLE CHEST 1 VIEW COMPARISON:  03/08/2021 FINDINGS: Hazy density at the right apex is likely mediastinal structures accentuated by rotation, unchanged. There is no edema, consolidation, effusion, or pneumothorax. Normal heart size and aortic contours. IMPRESSION: Stable from prior.  No evidence of active disease. Electronically Signed   By: Jorje Guild M.D.   On: 01/02/2023 05:51    EKG: Sinus tachycardia rate of 130 diffuse nonspecific ST-T wave changes  ASSESSMENT AND PLAN:  Elevated troponin Possible non-STEMI Sepsis related to COVID infection DKA Acidosis Tachycardia Hypotension Abnormal EKG Fever Altered mental status Acute on chronic renal insufficiency . Plan Agree with admit to critical care for further management Continue DKA management and control Agree with broad-spectrum antibiotic therapy for sepsis management Echocardiogram for assessment of possible non-STEMI Maintain anticoagulation with heparin with elevated troponins over 24,000 Sepsis probably related to COVID with  hypotension continue supportive management Hypotension recommend supportive therapy with volume pressors as necessary with Levophed Consider nephrology input for acute on chronic renal insufficiency Continue to correct acidosis Follow-up troponins and EKGs No invasive procedures planned at this stage      Signed: Yolonda Kida MD, 01/09/2023, 12:20 PM

## 2023-01-03 NOTE — Progress Notes (Signed)
Notified bedside nurse of need to draw repeat lactic acid. 

## 2023-01-03 NOTE — Procedures (Signed)
Arterial Catheter Insertion Procedure Note  TAKERA SINGLETERRY  [id number]  1941-06-11  Date:12/25/2022  Time:7:24 AM    Provider Performing: Huel Cote [place]    Procedure: Insertion of Arterial Line [phone]) with US guidance [id number]   Indication(s) Blood pressure monitoring and/or need for frequent ABGs  Consent Risks of the procedure as well as the alternatives and risks of each were explained to the patient and/or caregiver.  Consent for the procedure was obtained and is signed in the bedside chart  Anesthesia None   Time Out Verified patient identification, verified procedure, site/side was marked, verified correct patient position, special equipment/implants available, medications/allergies/relevant history reviewed, required imaging and test results available.   Sterile Technique Maximal sterile technique including full sterile barrier drape, hand hygiene, sterile gown, sterile gloves, mask, hair covering, sterile ultrasound probe cover (if used).   Procedure Description Area of catheter insertion was cleaned with chlorhexidine and draped in sterile fashion. With real-time ultrasound guidance an arterial catheter was placed into the right femoral artery.  Appropriate arterial tracings confirmed on monitor.     Complications/Tolerance None; patient tolerated the procedure well.   EBL Minimal   Specimen(s) None  Venetia Night, AGACNP-BC Acute Care Nurse Practitioner [facility]   [phone] / [phone] Please see Amion for pager details.

## 2023-01-03 NOTE — Progress Notes (Signed)
CODE SEPSIS - PHARMACY COMMUNICATION  **Broad Spectrum Antibiotics should be administered within 1 hour of Sepsis diagnosis**  Time Code Sepsis Called/Page Received: 2/15 @ 0508  Antibiotics Ordered: metronidazole, cefepime , Vancomycin   Time of 1st antibiotic administration: Metronidazole 500 mg IV X 1 on 2/15 @ 0523  Additional action taken by pharmacy:   If necessary, Name of Provider/Nurse Contacted:     Tae Robak D ,PharmD Clinical Pharmacist  01/12/2023  5:44 AM

## 2023-01-03 NOTE — Plan of Care (Signed)
Care reviewed with the husband.

## 2023-01-03 NOTE — Progress Notes (Signed)
Spoke with nurse in room, to draw a repeat lactic acid

## 2023-01-03 NOTE — ED Triage Notes (Signed)
Patient arrives by EMS.  Husband said patient had not eaten at all yesterday, but at bedtime her fingerstick read high, so she was given insulin.  Husband checked her during the night, and it was still high, but patient would not respond.  Patient still unresponsive on arrival.  Fingerstick on arrival is high.

## 2023-01-04 ENCOUNTER — Inpatient Hospital Stay
Admit: 2023-01-04 | Discharge: 2023-01-04 | Disposition: A | Discharge status: Home / Self Care | Payer: PPO | Attending: Internal Medicine | Admitting: Internal Medicine

## 2023-01-04 ENCOUNTER — Encounter: Payer: Self-pay | Admitting: Hematology and Oncology

## 2023-01-04 DIAGNOSIS — E1011 Type 1 diabetes mellitus with ketoacidosis with coma: Secondary | ICD-10-CM | POA: Diagnosis not present

## 2023-01-04 DIAGNOSIS — R579 Shock, unspecified: Secondary | ICD-10-CM | POA: Diagnosis not present

## 2023-01-04 DIAGNOSIS — Z7189 Other specified counseling: Secondary | ICD-10-CM | POA: Diagnosis not present

## 2023-01-04 LAB — GLUCOSE, CAPILLARY
Glucose-Capillary: 136 mg/dL — ABNORMAL HIGH (ref 70–99)
Glucose-Capillary: 138 mg/dL — ABNORMAL HIGH (ref 70–99)
Glucose-Capillary: 138 mg/dL — ABNORMAL HIGH (ref 70–99)
Glucose-Capillary: 143 mg/dL — ABNORMAL HIGH (ref 70–99)
Glucose-Capillary: 165 mg/dL — ABNORMAL HIGH (ref 70–99)
Glucose-Capillary: 169 mg/dL — ABNORMAL HIGH (ref 70–99)
Glucose-Capillary: 172 mg/dL — ABNORMAL HIGH (ref 70–99)
Glucose-Capillary: 177 mg/dL — ABNORMAL HIGH (ref 70–99)
Glucose-Capillary: 188 mg/dL — ABNORMAL HIGH (ref 70–99)
Glucose-Capillary: 189 mg/dL — ABNORMAL HIGH (ref 70–99)
Glucose-Capillary: 193 mg/dL — ABNORMAL HIGH (ref 70–99)
Glucose-Capillary: 205 mg/dL — ABNORMAL HIGH (ref 70–99)
Glucose-Capillary: 224 mg/dL — ABNORMAL HIGH (ref 70–99)
Glucose-Capillary: 225 mg/dL — ABNORMAL HIGH (ref 70–99)
Glucose-Capillary: 233 mg/dL — ABNORMAL HIGH (ref 70–99)
Glucose-Capillary: 236 mg/dL — ABNORMAL HIGH (ref 70–99)
Glucose-Capillary: 237 mg/dL — ABNORMAL HIGH (ref 70–99)
Glucose-Capillary: 243 mg/dL — ABNORMAL HIGH (ref 70–99)
Glucose-Capillary: 254 mg/dL — ABNORMAL HIGH (ref 70–99)
Glucose-Capillary: 291 mg/dL — ABNORMAL HIGH (ref 70–99)

## 2023-01-04 LAB — BASIC METABOLIC PANEL
Anion gap: 13 (ref 5–15)
Anion gap: 11 (ref 5–15)
Anion gap: 9 (ref 5–15)
Anion gap: 12 (ref 5–15)
Anion gap: 10 (ref 5–15)
BUN: 48 mg/dL — ABNORMAL HIGH (ref 8–23)
BUN: 50 mg/dL — ABNORMAL HIGH (ref 8–23)
BUN: 49 mg/dL — ABNORMAL HIGH (ref 8–23)
BUN: 50 mg/dL — ABNORMAL HIGH (ref 8–23)
BUN: 46 mg/dL — ABNORMAL HIGH (ref 8–23)
CO2: 24 mmol/L (ref 22–32)
CO2: 27 mmol/L (ref 22–32)
CO2: 28 mmol/L (ref 22–32)
CO2: 28 mmol/L (ref 22–32)
CO2: 29 mmol/L (ref 22–32)
Calcium: 7.1 mg/dL — ABNORMAL LOW (ref 8.9–10.3)
Calcium: 7.8 mg/dL — ABNORMAL LOW (ref 8.9–10.3)
Calcium: 7.9 mg/dL — ABNORMAL LOW (ref 8.9–10.3)
Calcium: 7.8 mg/dL — ABNORMAL LOW (ref 8.9–10.3)
Calcium: 7.6 mg/dL — ABNORMAL LOW (ref 8.9–10.3)
Chloride: 95 mmol/L — ABNORMAL LOW (ref 98–111)
Chloride: 95 mmol/L — ABNORMAL LOW (ref 98–111)
Chloride: 99 mmol/L (ref 98–111)
Chloride: 95 mmol/L — ABNORMAL LOW (ref 98–111)
Chloride: 97 mmol/L — ABNORMAL LOW (ref 98–111)
Creatinine, Ser: 2.36 mg/dL — ABNORMAL HIGH (ref 0.44–1.00)
Creatinine, Ser: 2.25 mg/dL — ABNORMAL HIGH (ref 0.44–1.00)
Creatinine, Ser: 2.15 mg/dL — ABNORMAL HIGH (ref 0.44–1.00)
Creatinine, Ser: 2.11 mg/dL — ABNORMAL HIGH (ref 0.44–1.00)
Creatinine, Ser: 1.94 mg/dL — ABNORMAL HIGH (ref 0.44–1.00)
GFR, Estimated: 20 mL/min — ABNORMAL LOW (ref >60)
GFR, Estimated: 21 mL/min — ABNORMAL LOW (ref >60)
GFR, Estimated: 22 mL/min — ABNORMAL LOW (ref >60)
GFR, Estimated: 23 mL/min — ABNORMAL LOW (ref >60)
GFR, Estimated: 25 mL/min — ABNORMAL LOW (ref >60)
Glucose, Bld: 259 mg/dL — ABNORMAL HIGH (ref 70–99)
Glucose, Bld: 305 mg/dL — ABNORMAL HIGH (ref 70–99)
Glucose, Bld: 247 mg/dL — ABNORMAL HIGH (ref 70–99)
Glucose, Bld: 205 mg/dL — ABNORMAL HIGH (ref 70–99)
Glucose, Bld: 174 mg/dL — ABNORMAL HIGH (ref 70–99)
Potassium: 4.1 mmol/L (ref 3.5–5.1)
Potassium: 4.4 mmol/L (ref 3.5–5.1)
Potassium: 4.1 mmol/L (ref 3.5–5.1)
Potassium: 3.7 mmol/L (ref 3.5–5.1)
Potassium: 3.7 mmol/L (ref 3.5–5.1)
Sodium: 132 mmol/L — ABNORMAL LOW (ref 135–145)
Sodium: 133 mmol/L — ABNORMAL LOW (ref 135–145)
Sodium: 136 mmol/L (ref 135–145)
Sodium: 135 mmol/L (ref 135–145)
Sodium: 136 mmol/L (ref 135–145)

## 2023-01-04 LAB — LACTIC ACID, PLASMA
Lactic Acid, Venous: 4 mmol/L (ref 0.5–1.9)
Lactic Acid, Venous: 4.9 mmol/L (ref 0.5–1.9)

## 2023-01-04 LAB — CBC
HCT: 32.9 % — ABNORMAL LOW (ref 36.0–46.0)
Hemoglobin: 11.3 g/dL — ABNORMAL LOW (ref 12.0–15.0)
MCH: 32.1 pg (ref 26.0–34.0)
MCHC: 34.3 g/dL (ref 30.0–36.0)
MCV: 93.5 fL (ref 80.0–100.0)
Platelets: 182 10*3/uL (ref 150–400)
RBC: 3.52 MIL/uL — ABNORMAL LOW (ref 3.87–5.11)
RDW: 14.2 % (ref 11.5–15.5)
WBC: 26.3 10*3/uL — ABNORMAL HIGH (ref 4.0–10.5)
nRBC: 0 % (ref 0.0–0.2)

## 2023-01-04 LAB — ECHOCARDIOGRAM COMPLETE
AR max vel: 1.68 cm2
AV Area VTI: 1.55 cm2
AV Area mean vel: 1.55 cm2
AV Mean grad: 6 mmHg
AV Peak grad: 10.2 mmHg
Ao pk vel: 1.6 m/s
Area-P 1/2: 5.5 cm2
Height: 64 in
MV VTI: 2.33 cm2
S' Lateral: 3.15 cm
Weight: 1590.84 oz

## 2023-01-04 LAB — HEPARIN LEVEL (UNFRACTIONATED)
Heparin Unfractionated: 0.76 IU/mL — ABNORMAL HIGH (ref 0.30–0.70)
Heparin Unfractionated: 0.58 IU/mL (ref 0.30–0.70)

## 2023-01-04 LAB — FERRITIN: Ferritin: 2385 ng/mL — ABNORMAL HIGH (ref 11–307)

## 2023-01-04 LAB — C-REACTIVE PROTEIN: CRP: 7 mg/dL — ABNORMAL HIGH (ref <1.0)

## 2023-01-04 LAB — PHOSPHORUS: Phosphorus: 4 mg/dL (ref 2.5–4.6)

## 2023-01-04 LAB — MAGNESIUM: Magnesium: 1.7 mg/dL (ref 1.7–2.4)

## 2023-01-04 LAB — D-DIMER, QUANTITATIVE: D-Dimer, Quant: 1.49 ug/mL-FEU — ABNORMAL HIGH (ref 0.00–0.50)

## 2023-01-04 MED ORDER — AMIODARONE HCL IN DEXTROSE 360-4.14 MG/200ML-% IV SOLN
60.0000 mg/h | INTRAVENOUS | Status: AC
  Administered 2023-01-04 (×2): 60 mg/h via INTRAVENOUS
  Filled 2023-01-04 (×2): qty 200

## 2023-01-04 MED ORDER — AMIODARONE HCL IN DEXTROSE 360-4.14 MG/200ML-% IV SOLN
30.0000 mg/h | INTRAVENOUS | Status: DC
  Administered 2023-01-04 – 2023-01-05 (×3): 30 mg/h via INTRAVENOUS
  Filled 2023-01-04 (×2): qty 200

## 2023-01-04 MED ORDER — PREDNISONE 10 MG PO TABS: 50.0000 mg | ORAL_TABLET | Freq: Every day | ORAL | Status: DC

## 2023-01-04 MED ORDER — INSULIN GLARGINE-YFGN 100 UNIT/ML ~~LOC~~ SOLN
2.0000 [IU] | SUBCUTANEOUS | Status: DC
  Administered 2023-01-04: 2 [IU] via SUBCUTANEOUS
  Filled 2023-01-04: qty 0.02

## 2023-01-04 MED ORDER — INSULIN ASPART 100 UNIT/ML IJ SOLN: 0.0000 [IU] | Freq: Three times a day (TID) | INTRAMUSCULAR | Status: DC

## 2023-01-04 MED ORDER — CALCIUM GLUCONATE-NACL 2-0.675 GM/100ML-% IV SOLN
2.0000 g | Freq: Once | INTRAVENOUS | Status: AC
  Administered 2023-01-04: 2000 mg via INTRAVENOUS
  Filled 2023-01-04: qty 100

## 2023-01-04 MED ORDER — POTASSIUM CHLORIDE 2 MEQ/ML IV SOLN
INTRAVENOUS | Status: DC
  Filled 2023-01-04 (×3): qty 1000

## 2023-01-04 MED ORDER — AMIODARONE IV BOLUS ONLY 150 MG/100ML
150.0000 mg | Freq: Once | INTRAVENOUS | Status: AC
  Administered 2023-01-04: 150 mg via INTRAVENOUS
  Filled 2023-01-04: qty 100

## 2023-01-04 NOTE — Consult Note (Addendum)
Consultation Note Date: 01/04/2023   Patient Name: [name] [name]  DOB: 18-Sep-1941  MRN: [id number]  Age / Sex: 82 y.o., female  PCP: Tracie Harrier, MD Referring Physician: Bronson Ing, MD  Reason for Consultation: Establishing goals of care  HPI/Patient Profile: 82 yo F presenting to [facility] ED from home via EMS on 12/28/2022 due to hyperglycemia and altered mental status. Husband met with me and shares she got abdominal pain in middle of night got up out of bed to check her blood glucose and it was >600.  She apparently is compliant with her medications.  Husband denies flu like illness sick contacts denies NVD or cough.  She was found to have COVID19+ in DKA.  She is oliguric but on room air. PCCM admission for DKA in shock requiring pressors.   Clinical Assessment and Goals of Care: Notes and labs reviewed.  Patient is known to our service from a previous admission, where patient was clear at that time that she would not want any machines to live including an insulin pump.  Patient is currently resting in ICU bed.  No family at bedside.  Nurse states patient has had a run of V. tach.  Called to speak to patient's husband.  He discusses that over the last 6 months her memory has been failing and the doctors have not recommended any medication for her dementia.  He discusses that prior to admission she began having difficulty controlling her sugars.  He discusses this admission and her care.  He has been kept updated by CCM team.   We discussed her diagnoses, prognosis, GOC, EOL wishes disposition and options.  Created space and opportunity for patient  to explore thoughts and feelings regarding current medical information.   A detailed discussion was had today regarding advanced directives.  Concepts specific to code status, artifical feeding and hydration, IV antibiotics and rehospitalization were discussed.   The difference between an aggressive medical intervention path and a comfort care path was discussed.  Values and goals of care important to patient and family were attempted to be elicited.  Discussed limitations of medical interventions to prolong quality of life in some situations and discussed the concept of human mortality.  Husband and I discussed a previous conversation regarding goals of care.  He confirms DNR/DNI status.  He confirms no dialysis or any other tubes including a long-term feeding tube for his wife to live.  He understands that she is currently on pressor support.  He says that he understands his wife's poor prognosis and that she could die at any time.  He discusses long-term care insurance.   He would like to see how his wife does over the next few days and continue with updates by CCM.  I discussed follow-up next week and he requests that I reach out upon return to service on Tuesday.    SUMMARY OF RECOMMENDATIONS   Has been request that I reach out to him on my return to service Tuesday.  He would like to  continue updates and discussions with CCM.   Prognosis:  Poor      Primary Diagnoses: Present on Admission:  DKA (diabetic ketoacidosis) ([place])   I have reviewed the medical record, interviewed the patient and family, and examined the patient. The following aspects are pertinent.  Past Medical History:  Diagnosis Date   Breast cancer ([place]) 2011   RT MASTECTOMY   Cancer ([place]) 2011   BREAST CA   diabetes insulin dep    High cholesterol    Hypertension    Hypothyroidism    Osteoporosis    Thyroid disease    Social History   Socioeconomic History   Marital status: Married    Spouse name: Not on file   Number of children: Not on file   Years of education: Not on file   Highest education level: Not on file  Occupational History   Not on file  Tobacco Use   Smoking status: Never   Smokeless tobacco: Never  Substance and Sexual Activity    Alcohol use: No   Drug use: No   Sexual activity: Not Currently  Other Topics Concern   Not on file  Social History Narrative   Not on file   Social Determinants of Health   Financial Resource Strain: Not on file  Food Insecurity: No Food Insecurity (12/31/2022)   Hunger Vital Sign    Worried About Running Out of Food in the Last Year: Never true    Ran Out of Food in the Last Year: Never true  Transportation Needs: No Transportation Needs (01/08/2023)   PRAPARE - Hydrologist (Medical): No    Lack of Transportation (Non-Medical): No  Physical Activity: Not on file  Stress: Not on file  Social Connections: Not on file   Family History  Problem Relation Age of Onset   Heart disease Father        heart attack   Breast cancer Neg Hx    Scheduled Meds:  vitamin C  500 mg Oral Daily   Chlorhexidine Gluconate Cloth  6 each Topical Daily   folic acid  1 mg Intravenous Daily   pantoprazole (PROTONIX) IV  40 mg Intravenous Q24H   [START ON 01/05/2023] predniSONE  50 mg Oral Q breakfast   thiamine (VITAMIN B1) injection  100 mg Intravenous Daily   zinc sulfate  220 mg Oral Daily   Continuous Infusions:  sodium chloride 10 mL/hr at 01/04/23 1333   amiodarone 60 mg/hr (01/04/23 1333)   amiodarone     ceFEPime (MAXIPIME) IV Stopped (01/04/23 0454)   dextrose 5% lactated ringers 125 mL/hr at 01/04/23 1333   heparin 400 Units/hr (01/04/23 1333)   insulin 3.4 Units/hr (01/04/23 1333)   lactated ringers     lactated ringers Stopped (12/28/2022 1530)   norepinephrine (LEVOPHED) Adult infusion 10 mcg/min (01/04/23 1333)   sodium bicarbonate 150 mEq in sterile water 1,150 mL infusion 75 mL/hr at 01/04/23 1333   vasopressin Stopped (01/04/23 0545)   PRN Meds:.albuterol, dextrose, docusate sodium, mouth rinse, polyethylene glycol Medications Prior to Admission:  Prior to Admission medications   Medication Sig Start Date End Date Taking? Authorizing Provider   Calcium Carbonate-Vitamin D 500-125 MG-UNIT TABS Take by mouth.   Yes [provider]  enalapril (VASOTEC) 20 MG tablet TAKE 1 TABLET TWICE A DAY 08/25/18  Yes [provider]  insulin glargine (LANTUS) 100 UNIT/ML injection Inject 0.04 mLs (4 Units total) into the skin daily. 03/15/21  Yes  Wyvonnia Dusky, MD  levothyroxine (SYNTHROID) 25 MCG tablet Take 25 mcg by mouth daily before breakfast. 07/27/21  Yes [provider]  lovastatin (MEVACOR) 20 MG tablet Take 20 mg by mouth daily. 10/11/21  Yes [provider]  Multiple Vitamins-Minerals (MACUVITE EYE CARE) TABS Take 1 tablet by mouth 2 (two) times daily.   Yes [provider]  vitamin B-12 (CYANOCOBALAMIN) 100 MCG tablet Take 100 mcg by mouth daily.   Yes [provider]  ciprofloxacin (CIPRO) 250 MG tablet Take 250 mg by mouth 2 (two) times daily. Patient not taking: Reported on 01/04/2023 12/21/22   [provider]  denosumab (PROLIA) 60 MG/ML SOLN injection Inject 60 mg into the skin every 6 (six) months. Patient not taking: Reported on 01/14/2023 05/14/16   [provider]  glucose blood (ONETOUCH VERIO) test strip  05/08/17   [provider]  insulin aspart (NOVOLOG) 100 UNIT/ML injection 3-10 Units. 12/11/19   [provider]   Allergies  Allergen Reactions   Lisinopril Rash and Other (See Comments)    Hyponatremia  Hyponatremia  Hyponatremia    Sulfa Antibiotics Other (See Comments)    possible possible Patient doesn't know    Sulfasalazine    Review of Systems  Unable to perform ROS   Physical Exam Constitutional:      Comments: Eyes closed  Pulmonary:     Comments: Even and unlabored    Vital Signs: BP 100/69   Pulse 83   Temp 98.2 F (36.8 C)   Resp (!) 23   Ht '5\' 4"'$  (1.626 m)   Wt 45.1 kg   LMP  (LMP Unknown)   SpO2 97%   BMI 17.07 kg/m  Pain Scale: CPOT   Pain Score: Asleep   SpO2: SpO2: 97 % O2 Device:SpO2: 97  % O2 Flow Rate: .O2 Flow Rate (L/min): 2 L/min  IO: Intake/output summary:  Intake/Output Summary (Last 24 hours) at 01/04/2023 1353 Last data filed at 01/04/2023 1333 Gross per 24 hour  Intake 7640.77 ml  Output 390 ml  Net 7250.77 ml    LBM: Last BM Date :  (PTA) Baseline Weight: Weight: 44 kg Most recent weight: Weight: 45.1 kg      Signed by: Asencion Gowda, NP   Please contact Palliative Medicine Team phone at [phone] for questions and concerns.  For individual provider: See Shea Evans

## 2023-01-04 NOTE — Progress Notes (Signed)
PHARMACY CONSULT NOTE - FOLLOW UP  Pharmacy Consult for Electrolyte Monitoring and Replacement   Recent Labs: Potassium (mmol/L)  Date Value  01/04/2023 4.4  09/28/2014 4.4   Magnesium (mg/dL)  Date Value  01/04/2023 1.7   Calcium (mg/dL)  Date Value  01/04/2023 7.8 (L)   Calcium, Total (mg/dL)  Date Value  09/28/2014 9.0   Albumin (g/dL)  Date Value  01/01/2023 3.6  09/28/2014 3.8   Phosphorus (mg/dL)  Date Value  01/04/2023 4.0   Sodium (mmol/L)  Date Value  01/04/2023 133 (L)  09/28/2014 133 (L)     Assessment: 82 year old female admitted with DKA, sepsis, and ACS. In AKI with Scr 2.64 (Baseline Scr < 1). On IV insulin infusion at 3 units/hr. Anion gap initially 30 on admission.  Diet: NPO  Goal of Therapy:  K > 4 while on IV insulin Mag > 2 All other electrolytes within normal limits  Plan:  No replacement warranted. Next BMP in 4 hours BMP every 4 hours while on IV insulin   Wynelle Cleveland ,PharmD Clinical Pharmacist 01/04/2023 8:08 AM

## 2023-01-04 NOTE — TOC Initial Note (Signed)
Transition of Care [facility]) - Initial/Assessment Note    Patient Details  Name: [name] [name] MRN: [id number] Date of Birth: 07-11-1941  Transition of Care [facility]) CM/SW Contact:    Shelbie Hutching, RN Phone Number: 01/04/2023, 10:42 AM  Clinical Narrative:                 Patient admitted to the hospital with DKA.  Patient is from home with her husband, currently in the ICU, unresponsive.    TOC will follow.  Expected Discharge Plan:  (TBD) Barriers to Discharge: Continued Medical Work up   Patient Goals and CMS Choice Patient states their goals for this hospitalization and ongoing recovery are:: unresponsive          Expected Discharge Plan and Services       Living arrangements for the past 2 months: Single Family Home                                      Prior Living Arrangements/Services Living arrangements for the past 2 months: Single Family Home Lives with:: Spouse                   Activities of Daily Living Home Assistive Devices/Equipment: CBG Meter, Eyeglasses, Raised toilet seat with rails ADL Screening (condition at time of admission) Patient's cognitive ability adequate to safely complete daily activities?: Yes Is the patient deaf or have difficulty hearing?: Yes Does the patient have difficulty seeing, even when wearing glasses/contacts?: No Does the patient have difficulty concentrating, remembering, or making decisions?: Yes Patient able to express need for assistance with ADLs?: No Does the patient have difficulty dressing or bathing?: No Independently performs ADLs?: Yes (appropriate for developmental age) Does the patient have difficulty walking or climbing stairs?: Yes Weakness of Legs: Both Weakness of Arms/Hands: Both  Permission Sought/Granted                  Emotional Assessment   Attitude/Demeanor/Rapport: Unresponsive Affect (typically observed): Unable to Assess   Alcohol / Substance Use: Not Applicable Psych  Involvement: No (comment)  Admission diagnosis:  DKA (diabetic ketoacidosis) ([place]) [E11.10] Diabetic ketoacidosis with coma associated with type 1 diabetes mellitus ([place]) [E10.11] Sepsis, due to unspecified organism, unspecified whether acute organ dysfunction present ([place]) [A41.9] COVID-19 [U07.1] Patient Active Problem List   Diagnosis Date Noted   Sepsis ([place]) 12/25/2022   COVID-19 12/22/2022   CAD (coronary artery disease) 03/15/2021   DKA (diabetic ketoacidosis) ([place]) 03/08/2021   HTN (hypertension) 99991111   Acute metabolic encephalopathy [id number]   SIRS (systemic inflammatory response syndrome) ([place]) 03/08/2021   Hyperkalemia 03/08/2021   Lactic acid acidosis 03/08/2021   Nausea & vomiting 03/08/2021   NSVT (nonsustained ventricular tachycardia) ([place]) 03/08/2021   NSTEMI (non-ST elevated myocardial infarction) ([place]) 03/08/2021   Erroneous encounter - disregard 10/09/2019   Pain due to onychomycosis of toenails of both feet 05/14/2019   Porokeratosis 05/14/2019   Encephalopathy acute 11/22/2017   Pneumonia, community acquired 11/22/2017   Pseudogout 11/22/2017   Protein-calorie malnutrition, severe 11/14/2017   AKI (acute kidney injury) ([place]) 11/13/2017   DKA (diabetic ketoacidoses) 07/31/2017   Hyponatremia 06/18/2016   Osteoporosis 04/23/2016   Breast cancer ([place]) 03/30/2016   Hyperlipemia 03/30/2016   BP (high blood pressure) 03/30/2016   Hypothyroidism 03/30/2016   Type 1 diabetes mellitus with hypoglycemia and without coma ([place]) 02/10/2016   Type 1 diabetes mellitus  without complication ([place]) [id number]   Dermatophytosis of nail 08/24/2013   Pain in lower limb 08/24/2013   PCP:  Tracie Harrier, MD Pharmacy:   [facility] [address], [place] [address] Phone: [phone] Fax: [phone]  [facility] [place], [place]  [address] Phone: [phone] Fax: [phone]  [facility] [place], [place] [address] [place] [address] Phone: [phone] Fax: [phone]     Social Determinants of Health (SDOH) Social History: [place]: No Food Insecurity (01/15/2023)  Housing: Low Risk  (12/28/2022)  Transportation Needs: No Transportation Needs (01/08/2023)  Utilities: Not At Risk (01/10/2023)  Tobacco Use: Low Risk  (12/28/2022)   SDOH Interventions:     Readmission Risk Interventions     No data to display

## 2023-01-04 NOTE — Progress Notes (Signed)
Initial Nutrition Assessment  DOCUMENTATION CODES:   Severe malnutrition in context of chronic illness  INTERVENTION:   RD will monitor for diet advancement   Pt at high refeed risk  NUTRITION DIAGNOSIS:   Severe Malnutrition related to chronic illness (uncontrolled DM, dementia) as evidenced by severe fat depletion, severe muscle depletion.  GOAL:   Patient will meet greater than or equal to 90% of their needs  MONITOR:   Diet advancement, Labs, Weight trends, Skin, I & O's  REASON FOR ASSESSMENT:   Consult Assessment of nutrition requirement/status  ASSESSMENT:   82 y/o female with h/o Type 1 DM (juvenile type), breast cancer s/p mastectomy, dementia, HLD, HTN, thyroid disease and CAD who is admitted with COVID 19 and DKA.  Pt resting in bed. Pt is non verbal and is unable to provide any nutrition related history. Pt has remained NPO since admission. Palliative care is following. Pt's husband reports that he does not want feeding tube placed. RD will monitor for diet advancement vs GOC. Pt is at high refeed risk.   Per chart, pt appears weight stable at baseline.   Medications reviewed and include: vitamin C, folic acid, protonix, prednisone, thiamine, zinc, cefepime, LRS w/ 5% dextrose @125ml$ /hr, heparin, insulin, levophed, Na Bicarbonate, vasopressin    Labs reviewed: K 3.7 wnl, BUN 50), creat 2.11(H), P 4.0 wnl, Mg 1.7 wnl Wbc- 26.3(H) Cbgs- 205, 193, 224, 225, 243, 233, 236, 254, 291 x 24 hrs  AIC 6.9(H)- 2/15  NUTRITION - FOCUSED PHYSICAL EXAM:  Flowsheet Row Most Recent Value  Orbital Region Severe depletion  Upper Arm Region Moderate depletion  Thoracic and Lumbar Region Severe depletion  Buccal Region Severe depletion  Temple Region Moderate depletion  Clavicle Bone Region Severe depletion  Clavicle and Acromion Bone Region Severe depletion  Scapular Bone Region Moderate depletion  Dorsal Hand Severe depletion  Patellar Region Moderate depletion   Anterior Thigh Region Moderate depletion  Posterior Calf Region Moderate depletion  Edema (RD Assessment) None  Hair Reviewed  Eyes Reviewed  Mouth Reviewed  Skin Reviewed  Nails Reviewed   Diet Order:   Diet Order             Diet NPO time specified  Diet effective now                  EDUCATION NEEDS:   No education needs have been identified at this time  Skin:  Skin Assessment: Reviewed RN Assessment  Last BM:  pta  Height:   Ht Readings from Last 1 Encounters:  01/11/2023 5' 4"$  (1.626 m)    Weight:   Wt Readings from Last 1 Encounters:  01/04/23 45.1 kg    Ideal Body Weight:  54.5 kg  BMI:  Body mass index is 17.07 kg/m.  Estimated Nutritional Needs:   Kcal:  1300-1500kcal/day  Protein:  65-75g/day  Fluid:  1.2-1.4L/day  Koleen Distance MS, RD, LDN Please refer to [facility] for RD and/or RD on-call/weekend/after hours pager

## 2023-01-04 NOTE — Inpatient Diabetes Management (Addendum)
Inpatient Diabetes Program Recommendations  AACE/ADA: New Consensus Statement on Inpatient Glycemic Control (2015)  Target Ranges:  Prepandial:   less than 140 mg/dL      Peak postprandial:   less than 180 mg/dL (1-2 hours)      Critically ill patients:  140 - 180 mg/dL   Lab Results  Component Value Date   GLUCAP 224 (H) 01/04/2023   HGBA1C 6.9 (H) 01/08/2023    Latest Reference Range & Units 01/04/23 05:05 01/04/23 06:04 01/04/23 06:57 01/04/23 07:51 01/04/23 08:59 01/04/23 10:04 01/04/23 11:02  Glucose-Capillary 70 - 99 mg/dL 291 (H) 254 (H) 236 (H) 233 (H) 243 (H) 225 (H) 224 (H)  (H): Data is abnormally high  Diabetes history: DM1 Outpatient Diabetes medications: Lantus 6 units q hs, Novolog 3-10 units tid meal coverage Current orders for Inpatient glycemic control: IV insulin, Solumedrol 22 mg q 12 hrs., then transition to Prednisone 50 mg qd on 2/18  Inpatient Diabetes Program Recommendations:   Reviewed CBGs and insulin rates with RN. IV insulin rates increase after receives IV steroids. For today, while on Solumedrol, recommend safest insulin is IV insulin. Patient's home insulin needs are very low.  Thank you, Nani Gasser. Laisa Larrick, RN, MSN, CDE  Diabetes Coordinator Inpatient Glycemic Control Team Team Pager [phone] (8am-5pm) 01/04/2023 11:23 AM

## 2023-01-04 NOTE — Progress Notes (Signed)
NAME:  [name] [name], MRN:  [id number], DOB:  [birth year], LOS: 1 ADMISSION DATE:  01/02/2023, CONSULTATION DATE:  12/21/2022 REFERRING MD:  Dr. Jari Pigg, CHIEF COMPLAINT:  Altered Mental Status   History of Present Illness:  82 yo F presenting to [facility] ED from home via EMS on 12/20/2022 due to hyperglycemia and altered mental status. Husband met with me and shares she got abdominal pain in middle of night got up out of bed to check her blood glucose and it was >600.  She apparently is compliant with her medications.  Husband denies flu like illness sick contacts denies NVD or cough.  She was found to have COVID19+ in DKA.  She is oliguric but on room air. PCCM admission for DKA in shock requiring pressors.   ED course: Upon arrival patient remains altered meeting SIRS criteria with tachycardia, low grade fever, tachypnea & hypotension.  Medications given: acetaminophen 1g, cefepime/ flagyl/ vancomycin, 2 L  LR bolus, levophed drip started Initial Vitals: 100.5, 24, 111, 82/37 & 100% on RA Significant labs: (Labs/ Imaging personally reviewed) I, Domingo Pulse [place], AGACNP-BC, personally viewed and interpreted this ECG. EKG Interpretation: Date: 01/09/2023, EKG Time: 05:08, Rate: 109, Rhythm: ST, QRS Axis:  RAD, Intervals: normal, ST/T Wave abnormalities: diffuse ST depression and T wave inversions, Narrative Interpretation: ST with diffuse ST depression & T wave inversions concerning for ischemia  01/04/2023: gap has closed. Patient continues to be somnolent.  Pertinent  Medical History  Breast Cancer s/p R mastectomy (2011) HLD Hypothyroidism HTN T2DM Significant Hospital Events: Including procedures, antibiotic start and stop dates in addition to other pertinent events       Objective   Blood pressure 106/71, pulse 86, temperature 98.1 F (36.7 C), resp. rate (!) 21, height 5' 4"$  (1.626 m), weight 45.1 kg, SpO2 97 %.        Intake/Output Summary (Last 24 hours) at 01/04/2023 1601 Last  data filed at 01/04/2023 1500 Gross per 24 hour  Intake 7018.74 ml  Output 290 ml  Net 6728.74 ml   Filed Weights   01/16/2023 0535 01/08/2023 0542 01/04/23 0400  Weight: 44 kg 44 kg 45.1 kg    Examination: General: Adult NAD ager appropirate, criticallyill but not in distress acutely ill, lying in bed ion room airNAD HEENT: MM pink/moist, anicteric, atraumatic, neck supple Neuro: A&O x non verbal commands, PERRL GCS9 , MAE CV: s1s2 RRR, PVCs  on monitor, no r/m/g Pulm: Regular, non labored on room air , breath sounds mild rhonchi- GI: soft, non distended , non-tender, bs x 4 GU: foley not in place  Skin:  no rashes/lesions noted Extremities: warm/dry, pulses + 2 R/P, edema noted  Resolved Hospital Problem list     Assessment & Plan:  Diabetic ketoacidosis  PMHx: IDDM -patient with acidemia and shock -complinace issues: -Insulin pump function? - DKA protocol initiated - if pH < 7, consider sodium bicarbonate infusion - Aggressive IV hydration, when blood glucose falls below 250 add D5 to IV fluids - Insulin drip ordered per Endo tool protocol - Strict I/O's: alert provider if UOP < 0.5 mL/kg/hr - Q 4 BMP, closely monitor potassium, replace electrolytes PRN - monitor blood glucose every 1 hour, per Endo tool protocol - trend serum CO2/ AG/ Lactic, f/u A1C - Diabetes coordinator consult - transition to long acting insulin  Circulatory shock - possibly related to COVID19 vs DKA vs onther infection , septic workup is in process - elevated lactate continue to trend - MRSA screen  negative, continue CEFEPIME for - Blood cultures done  - UA with reflex ordered - no DVT on [place] - Supplemental oxygen as needed, to maintain SpO2 > 90% - f/u cultures, trend lactic/ PCT - Daily CBC, monitor WBC/ fever curve - IVF hydration as needed: LR bolus plus bicarb drip - Continue levophed and VP to maintain MAP> 65 - Strict I/O's: alert provider if UOP < 0.5 mL/kg/hr - prednisone 27m  daily  VT - continues to have persistent beats of VT - amio bolus and gtt - continue to monitor on telemetry  Palliative  Ongoing GoC discussion with family - family meeting tomorrow    Best Practice (right click and "Reselect all SmartList Selections" daily)  Diet/type: NPO DVT prophylaxis: SCD GI prophylaxis: PPI Lines: Central line and Arterial Line Foley:  Yes, and it is still needed Code Status:  DNR Last date of multidisciplinary goals of care discussion [today]  Labs   CBC: Recent Labs  Lab 12/30/2022 0505 01/04/23 0459  WBC 23.3* 26.3*  NEUTROABS 15.8*  --   HGB 10.4* 11.3*  HCT 35.5* 32.9*  MCV 108.2* 93.5  PLT 240 [id number]   Basic Metabolic Panel: Recent Labs  Lab 01/07/2023 0919 01/07/2023 1317 01/02/2023 1950 01/04/23 0055 01/04/23 0459 01/04/23 0856 01/04/23 1212  NA 135   < > 134* 132* 133* 136 135  K 4.2   < > 3.6 4.1 4.4 4.1 3.7  CL 97*   < > 95* 95* 95* 99 95*  CO2 15*   < > 22 24 27 28 28  $ GLUCOSE 891*   < > 458* 259* 305* 247* 205*  BUN 50*   < > 49* 48* 50* 49* 50*  CREATININE 2.68*   < > 2.63* 2.36* 2.25* 2.15* 2.11*  CALCIUM 7.9*   < > 7.6* 7.1* 7.8* 7.9* 7.8*  MG 2.0  --   --   --  1.7  --   --   PHOS 7.1*  --   --   --  4.0  --   --    < > = values in this interval not displayed.   GFR: Estimated Creatinine Clearance: 14.4 mL/min (A) (by C-G formula based on SCr of 2.11 mg/dL (H)). Recent Labs  Lab 12/25/2022 0505 01/16/2023 0507 01/05/2023 0919 01/05/2023 1858 01/04/23 0000 01/04/23 0459  WBC 23.3*  --   --   --   --  26.3*  LATICACIDVEN  --    < > 8.5* 6.2* 4.9* 4.0*   < > = values in this interval not displayed.    Liver Function Tests: Recent Labs  Lab 01/13/2023 0505  AST 65*  ALT 32  ALKPHOS 36*  BILITOT 1.5*  PROT 5.9*  ALBUMIN 3.6   Recent Labs  Lab 01/13/2023 1730 01/11/2023 1750  LIPASE  --  147*  AMYLASE 284*  --    No results for input(s): "AMMONIA" in the last 168 hours.  ABG    Component Value Date/Time   HCO3  25.4 01/10/2023 1950   ACIDBASEDEF 0.8 12/22/2022 1950   O2SAT 28.8 12/21/2022 1950     Coagulation Profile: Recent Labs  Lab 01/02/2023 1402  INR 1.2    Cardiac Enzymes: No results for input(s): "CKTOTAL", "CKMB", "CKMBINDEX", "TROPONINI" in the last 168 hours.  HbA1C: Hgb A1c MFr Bld  Date/Time Value Ref Range Status  12/31/2022 06:37 AM 6.9 (H) 4.8 - 5.6 % Final    Comment:    (NOTE) Pre diabetes:  5.7%-6.4%  Diabetes:              >6.4%  Glycemic control for   <7.0% adults with diabetes   03/09/2021 02:42 AM 9.0 (H) 4.8 - 5.6 % Final    Comment:    (NOTE) Pre diabetes:          5.7%-6.4%  Diabetes:              >6.4%  Glycemic control for   <7.0% adults with diabetes     CBG: Recent Labs  Lab 01/04/23 1102 01/04/23 1204 01/04/23 1323 01/04/23 1429 01/04/23 1534  GLUCAP 224* 193* 205* 189* 177*    Review of Systems:   UTA- patient somnolent, unable to participate in an interview at this time.  Past Medical History:  She,  has a past medical history of Breast cancer ([place]) (2011), Cancer ([place]) (2011), diabetes insulin dep, High cholesterol, Hypertension, Hypothyroidism, Osteoporosis, and Thyroid disease.   Surgical History:   Past Surgical History:  Procedure Laterality Date   BREAST BIOPSY Right 2011   positive   CATARACT EXTRACTION W/PHACO Right 06/13/2016   Procedure: CATARACT EXTRACTION PHACO AND INTRAOCULAR LENS PLACEMENT (IOC);  Surgeon: Estill Cotta, MD;  Location: [facility] ORS;  Service: Ophthalmology;  Laterality: Right;  [place] 03:07 AP% 27.4 CDE 93.21 fluid pack lot # [id number]   MASTECTOMY Right 2011   positive   TONSILLECTOMY     TOTAL ABDOMINAL HYSTERECTOMY       Social History:   reports that she has never smoked. She has never used smokeless tobacco. She reports that she does not drink alcohol and does not use drugs.   Family History:  Her family history includes Heart disease in her father. There is no history of Breast  cancer.   Allergies Allergies  Allergen Reactions   Lisinopril Rash and Other (See Comments)    Hyponatremia  Hyponatremia  Hyponatremia    Sulfa Antibiotics Other (See Comments)    possible possible Patient doesn't know    Sulfasalazine      Home Medications  Prior to Admission medications   Medication Sig Start Date End Date Taking? Authorizing Provider  Calcium Carbonate-Vitamin D 500-125 MG-UNIT TABS Take by mouth.    [provider]  denosumab (PROLIA) 60 MG/ML SOLN injection Inject 60 mg into the skin every 6 (six) months. 05/14/16   [provider]  enalapril (VASOTEC) 20 MG tablet TAKE 1 TABLET TWICE A DAY 08/25/18   [provider]  glucose blood (ONETOUCH VERIO) test strip  05/08/17   [provider]  insulin aspart (NOVOLOG) 100 UNIT/ML injection 3-10 Units. 12/11/19   [provider]  insulin glargine (LANTUS) 100 UNIT/ML injection Inject 0.04 mLs (4 Units total) into the skin daily. 03/15/21   Wyvonnia Dusky, MD  levothyroxine (SYNTHROID) 25 MCG tablet Take 1 tablet by mouth daily before breakfast. 07/27/21   [provider]  lovastatin (MEVACOR) 20 MG tablet Take 1 tablet by mouth daily. 10/11/21   [provider]  Multiple Vitamins-Minerals (MACUVITE EYE CARE) TABS Take 1 tablet by mouth 2 (two) times daily.    [provider]  vitamin B-12 (CYANOCOBALAMIN) 100 MCG tablet Take by mouth.    [provider]     Critical care provider statement:   Total critical care time: 70 minutes  Arcelia Jew MD [place]

## 2023-01-04 NOTE — Progress Notes (Signed)
*  PRELIMINARY RESULTS* Echocardiogram 2D Echocardiogram has been performed.  [name] [name] 01/04/2023, 10:46 AM

## 2023-01-04 NOTE — Progress Notes (Signed)
PHARMACY CONSULT NOTE - FOLLOW UP  Pharmacy Consult for Electrolyte Monitoring and Replacement   Recent Labs: Potassium (mmol/L)  Date Value  01/04/2023 4.1  09/28/2014 4.4   Magnesium (mg/dL)  Date Value  01/16/2023 2.0   Calcium (mg/dL)  Date Value  01/04/2023 7.1 (L)   Calcium, Total (mg/dL)  Date Value  09/28/2014 9.0   Albumin (g/dL)  Date Value  12/29/2022 3.6  09/28/2014 3.8   Phosphorus (mg/dL)  Date Value  12/29/2022 7.1 (H)   Sodium (mmol/L)  Date Value  01/04/2023 132 (L)  09/28/2014 133 (L)     Assessment: 2/16 @ 0055:  Ca = 7.1,  Alb = 3.6, Corrected Ca = 7.42  Goal of Therapy:  Electrolytes WNL   Plan:  Calcium gluconate 2 gm IV X 1 ordered for 2/16 @ ~ 0130.  Will recheck electrolytes on 2/16 with AM labs.   Orene Desanctis ,PharmD Clinical Pharmacist 01/04/2023 1:26 AM

## 2023-01-04 NOTE — Progress Notes (Addendum)
ANTICOAGULATION CONSULT NOTE - Initial Consult  Pharmacy Consult for IV heparin Indication: chest pain/ACS  Allergies  Allergen Reactions   Lisinopril Rash and Other (See Comments)    Hyponatremia  Hyponatremia  Hyponatremia    Sulfa Antibiotics Other (See Comments)    possible possible Patient doesn't know    Sulfasalazine     Patient Measurements: Height: 5' 4"$  (162.6 cm) Weight: 45.1 kg (99 lb 6.8 oz) IBW/kg (Calculated) : 54.7 Heparin Dosing Weight: 44 kg  Vital Signs: Temp: 98.6 F (37 C) (02/16 1015) Temp Source: Rectal (02/16 0800) BP: 104/66 (02/16 1015) Pulse Rate: 95 (02/16 1015)  Labs: Recent Labs    01/15/2023 0505 01/09/2023 0919 01/12/2023 1317 01/12/2023 1402 01/05/2023 1950 12/21/2022 2300 01/04/23 0055 01/04/23 0459 01/04/23 0856  HGB 10.4*  --   --   --   --   --   --  11.3*  --   HCT 35.5*  --   --   --   --   --   --  32.9*  --   PLT 240  --   --   --   --   --   --  182  --   APTT  --   --   --  42*  --   --   --   --   --   LABPROT  --   --   --  15.2  --   --   --   --   --   INR  --   --   --  1.2  --   --   --   --   --   HEPARINUNFRC  --   --   --  <0.10*  --  1.06*  --   --  0.76*  CREATININE 2.90* 2.68*   < >  --    < >  --  2.36* 2.25* 2.15*  TROPONINIHS 2,241* >24,000*  --   --   --   --   --   --   --    < > = values in this interval not displayed.     Estimated Creatinine Clearance: 14.1 mL/min (A) (by C-G formula based on SCr of 2.15 mg/dL (H)).   Medical History: Past Medical History:  Diagnosis Date   Breast cancer ([place]) 2011   RT MASTECTOMY   Cancer ([place]) 2011   BREAST CA   diabetes insulin dep    High cholesterol    Hypertension    Hypothyroidism    Osteoporosis    Thyroid disease     Medications:  Not on PTA anticoagulation per my chart review and review of fill history  Assessment: 82 year old female admitted to CCU with sepsis, DKA, and elevated troponins. Pharmacy has been consulted to start IV heparin for ACS.    Baseline labs in process. H&H stable  Goal of Therapy:  Heparin level 0.3-0.7 units/ml Monitor platelets by anticoagulation protocol: Yes   Plan: 2.16 HL 0.76, elevated Reduce heparin infusion to 400 units/hr. Will recheck HL 8 hrs after rate change  Glean Salvo, PharmD, BCPS Clinical Pharmacist  01/04/2023 10:50 AM

## 2023-01-04 NOTE — Progress Notes (Signed)
Pharmacy Antibiotic Note  [name] [name] is a 82 y.o. female admitted on 01/14/2023 with pneumonia and circulatory shock. PMH includes hypothyroid, HLD, breast cancer s/p mastectomy (2011), HTN, T2DM. Pharmacy has been consulted for cefepime dosing.  Plan: Continue cefepime IV 2 grams every 24 hours   Height: 5' 4"$  (162.6 cm) Weight: 45.1 kg (99 lb 6.8 oz) IBW/kg (Calculated) : 54.7  Temp (24hrs), Avg:99 F (37.2 C), Min:94.8 F (34.9 C), Max:100.6 F (38.1 C)  Recent Labs  Lab 01/09/2023 0505 01/07/2023 0507 12/26/2022 0919 01/02/2023 1317 01/16/2023 1858 12/21/2022 1950 01/04/23 0000 01/04/23 0055 01/04/23 0459  WBC 23.3*  --   --   --   --   --   --   --  26.3*  CREATININE 2.90*  --  2.68* 2.64*  --  2.63*  --  2.36* 2.25*  LATICACIDVEN  --  7.5* 8.5*  --  6.2*  --  4.9*  --  4.0*    Estimated Creatinine Clearance: 13.5 mL/min (A) (by C-G formula based on SCr of 2.25 mg/dL (H)).    Allergies  Allergen Reactions   Lisinopril Rash and Other (See Comments)    Hyponatremia  Hyponatremia  Hyponatremia    Sulfa Antibiotics Other (See Comments)    possible possible Patient doesn't know    Sulfasalazine     Antimicrobials this admission: cefepime 2/16 >>   Dose adjustments this admission: N/a  Microbiology results: 2/15 BCx: NG < 24 hours 2/15 UCx: pending  2/15 MRSA PCR: not detected 2/15 RPV: COVID+  Thank you for allowing pharmacy to be a part of this patient's care.  Glean Salvo, PharmD, BCPS Clinical Pharmacist  01/04/2023 9:05 AM

## 2023-01-04 NOTE — Progress Notes (Signed)
ANTICOAGULATION CONSULT NOTE - Initial Consult  Pharmacy Consult for IV heparin Indication: chest pain/ACS  Allergies  Allergen Reactions   Lisinopril Rash and Other (See Comments)    Hyponatremia  Hyponatremia  Hyponatremia    Sulfa Antibiotics Other (See Comments)    possible possible Patient doesn't know    Sulfasalazine     Patient Measurements: Height: 5' 4"$  (162.6 cm) Weight: 45.1 kg (99 lb 6.8 oz) IBW/kg (Calculated) : 54.7 Heparin Dosing Weight: 44 kg  Vital Signs: Temp: 97.9 F (36.6 C) (02/16 2000) Temp Source: Rectal (02/16 2000) BP: 109/72 (02/16 2000) Pulse Rate: 87 (02/16 2000)  Labs: Recent Labs    12/26/2022 0505 01/05/2023 0919 01/08/2023 1317 01/10/2023 1402 12/23/2022 1950 01/02/2023 2300 01/04/23 0055 01/04/23 0459 01/04/23 0856 01/04/23 1212 01/04/23 1659 01/04/23 1948  HGB 10.4*  --   --   --   --   --   --  11.3*  --   --   --   --   HCT 35.5*  --   --   --   --   --   --  32.9*  --   --   --   --   PLT 240  --   --   --   --   --   --  182  --   --   --   --   APTT  --   --   --  42*  --   --   --   --   --   --   --   --   LABPROT  --   --   --  15.2  --   --   --   --   --   --   --   --   INR  --   --   --  1.2  --   --   --   --   --   --   --   --   HEPARINUNFRC  --   --    < > <0.10*  --  1.06*  --   --  0.76*  --   --  0.58  CREATININE 2.90* 2.68*   < >  --    < >  --    < > 2.25* 2.15* 2.11* 1.94*  --   TROPONINIHS 2,241* >24,000*  --   --   --   --   --   --   --   --   --   --    < > = values in this interval not displayed.     Estimated Creatinine Clearance: 15.6 mL/min (A) (by C-G formula based on SCr of 1.94 mg/dL (H)).   Medical History: Past Medical History:  Diagnosis Date   Breast cancer ([place]) 2011   RT MASTECTOMY   Cancer ([place]) 2011   BREAST CA   diabetes insulin dep    High cholesterol    Hypertension    Hypothyroidism    Osteoporosis    Thyroid disease     Medications:  Not on PTA anticoagulation per my  chart review and review of fill history  Assessment: 82 year old female admitted to CCU with sepsis, DKA, and elevated troponins. Pharmacy has been consulted to start IV heparin for ACS.   Baseline labs in process. H&H stable  Goal of Therapy:  Heparin level 0.3-0.7 units/ml Monitor platelets by anticoagulation protocol: Yes  2/16 1948  HL 0.58, therapeutic x 1   Plan: Heparin level therapeutic Continue heparin drip at 400 units/hr Will recheck HL 8 hrs after rate change  Tollie Eth, PharmD Clinical Pharmacist  01/04/2023 9:32 PM

## 2023-01-04 NOTE — Progress Notes (Signed)
ANTICOAGULATION CONSULT NOTE - Initial Consult  Pharmacy Consult for IV heparin Indication: chest pain/ACS  Allergies  Allergen Reactions   Lisinopril Rash and Other (See Comments)    Hyponatremia  Hyponatremia  Hyponatremia    Sulfa Antibiotics Other (See Comments)    possible possible Patient doesn't know    Sulfasalazine     Patient Measurements: Height: 5' 4"$  (162.6 cm) Weight: 44 kg (97 lb) IBW/kg (Calculated) : 54.7 Heparin Dosing Weight: 44 kg  Vital Signs: Temp: 99.3 F (37.4 C) (02/16 0015) Temp Source: Rectal (02/16 0000) BP: 109/70 (02/16 0015) Pulse Rate: 96 (02/16 0015)  Labs: Recent Labs    01/02/2023 0505 01/07/2023 0919 01/05/2023 1317 01/05/2023 1402 01/02/2023 1950 01/01/2023 2300  HGB 10.4*  --   --   --   --   --   HCT 35.5*  --   --   --   --   --   PLT 240  --   --   --   --   --   APTT  --   --   --  42*  --   --   LABPROT  --   --   --  15.2  --   --   INR  --   --   --  1.2  --   --   HEPARINUNFRC  --   --   --  <0.10*  --  1.06*  CREATININE 2.90* 2.68* 2.64*  --  2.63*  --   TROPONINIHS 2,241* >24,000*  --   --   --   --      Estimated Creatinine Clearance: 11.3 mL/min (A) (by C-G formula based on SCr of 2.63 mg/dL (H)).   Medical History: Past Medical History:  Diagnosis Date   Breast cancer ([place]) 2011   RT MASTECTOMY   Cancer ([place]) 2011   BREAST CA   diabetes insulin dep    High cholesterol    Hypertension    Hypothyroidism    Osteoporosis    Thyroid disease     Medications:  Not on PTA anticoagulation per my chart review and review of fill history  Assessment: 82 year old female admitted to CCU with sepsis, DKA, and elevated troponins. Pharmacy has been consulted to start IV heparin for ACS.   Baseline labs in process. H&H stable  Goal of Therapy:  Heparin level 0.3-0.7 units/ml Monitor platelets by anticoagulation protocol: Yes   Plan:  2/15:  HL @ 2300 = 1.06, Elevated Will hold heparin drip for 1 hr and restart at  450 units/hr. Will recheck HL 8 hrs after restart.   Samael Blades D 01/04/2023,12:38 AM

## 2023-01-05 DIAGNOSIS — R579 Shock, unspecified: Secondary | ICD-10-CM

## 2023-01-05 DIAGNOSIS — E1011 Type 1 diabetes mellitus with ketoacidosis with coma: Secondary | ICD-10-CM | POA: Diagnosis not present

## 2023-01-05 LAB — GLUCOSE, CAPILLARY
Glucose-Capillary: 220 mg/dL — ABNORMAL HIGH (ref 70–99)
Glucose-Capillary: 170 mg/dL — ABNORMAL HIGH (ref 70–99)

## 2023-01-05 LAB — BASIC METABOLIC PANEL
Anion gap: 12 (ref 5–15)
BUN: 49 mg/dL — ABNORMAL HIGH (ref 8–23)
CO2: 28 mmol/L (ref 22–32)
Calcium: 7.3 mg/dL — ABNORMAL LOW (ref 8.9–10.3)
Chloride: 92 mmol/L — ABNORMAL LOW (ref 98–111)
Creatinine, Ser: 1.92 mg/dL — ABNORMAL HIGH (ref 0.44–1.00)
GFR, Estimated: 26 mL/min — ABNORMAL LOW (ref >60)
Glucose, Bld: 284 mg/dL — ABNORMAL HIGH (ref 70–99)
Potassium: 4.8 mmol/L (ref 3.5–5.1)
Sodium: 132 mmol/L — ABNORMAL LOW (ref 135–145)

## 2023-01-05 LAB — URINE CULTURE: Culture: NO GROWTH

## 2023-01-05 LAB — FERRITIN: Ferritin: 1848 ng/mL — ABNORMAL HIGH (ref 11–307)

## 2023-01-05 LAB — HEPARIN LEVEL (UNFRACTIONATED): Heparin Unfractionated: 0.6 IU/mL (ref 0.30–0.70)

## 2023-01-05 LAB — CBC
HCT: 32.8 % — ABNORMAL LOW (ref 36.0–46.0)
Hemoglobin: 11.3 g/dL — ABNORMAL LOW (ref 12.0–15.0)
MCH: 32.7 pg (ref 26.0–34.0)
MCHC: 34.5 g/dL (ref 30.0–36.0)
MCV: 94.8 fL (ref 80.0–100.0)
Platelets: 152 10*3/uL (ref 150–400)
RBC: 3.46 MIL/uL — ABNORMAL LOW (ref 3.87–5.11)
RDW: 14.6 % (ref 11.5–15.5)
WBC: 31.4 10*3/uL — ABNORMAL HIGH (ref 4.0–10.5)
nRBC: 0.1 % (ref 0.0–0.2)

## 2023-01-05 LAB — LACTIC ACID, PLASMA: Lactic Acid, Venous: 3.4 mmol/L (ref 0.5–1.9)

## 2023-01-05 LAB — TROPONIN I (HIGH SENSITIVITY): Troponin I (High Sensitivity): >24000 ng/L (ref <18)

## 2023-01-05 LAB — D-DIMER, QUANTITATIVE: D-Dimer, Quant: 2.18 ug/mL-FEU — ABNORMAL HIGH (ref 0.00–0.50)

## 2023-01-05 LAB — MAGNESIUM: Magnesium: 1.6 mg/dL — ABNORMAL LOW (ref 1.7–2.4)

## 2023-01-05 LAB — PHOSPHORUS: Phosphorus: 3.5 mg/dL (ref 2.5–4.6)

## 2023-01-05 LAB — C-REACTIVE PROTEIN: CRP: 4.4 mg/dL — ABNORMAL HIGH (ref <1.0)

## 2023-01-05 MED ORDER — LORAZEPAM 2 MG/ML IJ SOLN
2.0000 mg | INTRAMUSCULAR | Status: DC | PRN
  Administered 2023-01-05: 2 mg via INTRAVENOUS
  Filled 2023-01-05: qty 1

## 2023-01-05 MED ORDER — POLYVINYL ALCOHOL 1.4 % OP SOLN: 1.0000 [drp] | Freq: Four times a day (QID) | OPHTHALMIC | Status: DC | PRN

## 2023-01-05 MED ORDER — GLYCOPYRROLATE 0.2 MG/ML IJ SOLN: 0.2000 mg | INTRAMUSCULAR | Status: DC | PRN

## 2023-01-05 MED ORDER — LACTATED RINGERS IV SOLN: INTRAVENOUS | Status: DC

## 2023-01-05 MED ORDER — INSULIN ASPART 100 UNIT/ML IJ SOLN
0.0000 [IU] | INTRAMUSCULAR | Status: DC
  Administered 2023-01-05: 7 [IU] via SUBCUTANEOUS
  Administered 2023-01-05: 4 [IU] via SUBCUTANEOUS
  Filled 2023-01-05 (×2): qty 1

## 2023-01-05 MED ORDER — GLYCOPYRROLATE 1 MG PO TABS: 1.0000 mg | ORAL_TABLET | ORAL | Status: DC | PRN

## 2023-01-05 MED ORDER — LORAZEPAM 2 MG/ML IJ SOLN: 2.0000 mg | INTRAMUSCULAR | Status: DC | PRN

## 2023-01-05 MED ORDER — MAGNESIUM SULFATE 2 GM/50ML IV SOLN
2.0000 g | Freq: Once | INTRAVENOUS | Status: AC
  Administered 2023-01-05: 2 g via INTRAVENOUS
  Filled 2023-01-05: qty 50

## 2023-01-05 NOTE — Progress Notes (Signed)
[facility] Cardiology    SUBJECTIVE: Patient lying in bed still lethargic denies any pain hypotensive denies shortness of breath   Vitals:   01/05/23 0845 01/05/23 0900 01/05/23 0915 01/05/23 0930  BP: 93/63 101/68 97/61 96/62 $  Pulse: 91 92 89 89  Resp: (!) 22 (!) 22 (!) 21 (!) 22  Temp: 98.6 F (37 C) 98.6 F (37 C) 98.6 F (37 C) 98.4 F (36.9 C)  TempSrc:      SpO2: 94% 94% 95% 95%  Weight:      Height:         Intake/Output Summary (Last 24 hours) at 01/05/2023 1205 Last data filed at 01/05/2023 0900 Gross per 24 hour  Intake 4487.06 ml  Output 335 ml  Net 4152.06 ml      PHYSICAL EXAM  General: Well developed, well nourished, in no acute distress HEENT:  Normocephalic and atramatic Neck:  No JVD.  Lungs: Clear bilaterally to auscultation and percussion. Heart: HRRR . Normal S1 and S2 without gallops or murmurs.  Abdomen: Bowel sounds are positive, abdomen soft and non-tender  Msk:  Back normal, normal gait. Normal strength and tone for age. Extremities: No clubbing, cyanosis or edema.   Neuro: Alert and oriented X 3. Psych:  Good affect, responds appropriately   LABS: Basic Metabolic Panel: Recent Labs    01/04/23 0459 01/04/23 0856 01/04/23 1659 01/05/23 0354  NA 133*   < > 136 132*  K 4.4   < > 3.7 4.8  CL 95*   < > 97* 92*  CO2 27   < > 29 28  GLUCOSE 305*   < > 174* 284*  BUN 50*   < > 46* 49*  CREATININE 2.25*   < > 1.94* 1.92*  CALCIUM 7.8*   < > 7.6* 7.3*  MG 1.7  --   --  1.6*  PHOS 4.0  --   --  3.5   < > = values in this interval not displayed.   Liver Function Tests: Recent Labs    01/10/2023 0505  AST 65*  ALT 32  ALKPHOS 36*  BILITOT 1.5*  PROT 5.9*  ALBUMIN 3.6   Recent Labs    12/26/2022 1730 01/08/2023 1750  LIPASE  --  147*  AMYLASE 284*  --    CBC: Recent Labs    12/22/2022 0505 01/04/23 0459 01/05/23 0354  WBC 23.3* 26.3* 31.4*  NEUTROABS 15.8*  --   --   HGB 10.4* 11.3* 11.3*  HCT 35.5* 32.9* 32.8*  MCV 108.2* 93.5  94.8  PLT 240 182 152   Cardiac Enzymes: No results for input(s): "CKTOTAL", "CKMB", "CKMBINDEX", "TROPONINI" in the last 72 hours. BNP: Invalid input(s): "POCBNP" D-Dimer: Recent Labs    01/04/23 0459 01/05/23 0354  DDIMER 1.49* 2.18*   Hemoglobin A1C: Recent Labs    01/02/2023 0637  HGBA1C 6.9*   Fasting Lipid Panel: No results for input(s): "CHOL", "HDL", "LDLCALC", "TRIG", "CHOLHDL", "LDLDIRECT" in the last 72 hours. Thyroid Function Tests: Recent Labs    12/20/2022 0505  TSH 11.039*   Anemia Panel: Recent Labs    01/05/23 0354  FERRITIN 1,848*    ECHOCARDIOGRAM COMPLETE  Result Date: 01/04/2023    ECHOCARDIOGRAM REPORT   Patient Name:   [name] [name] Date of Exam: 01/04/2023 Medical Rec #:  [id number]    Height:       64.0 in Accession #:    [id number]   Weight:       99.4 lb  Date of Birth:  20-Sep-1941    BSA:          1.454 m Patient Age:    82 years     BP:           99/60 mmHg Patient Gender: F            HR:           94 bpm. Exam Location:  [facility] Procedure: 2D Echo, Cardiac Doppler and Color Doppler Indications:     Myocardial infarct  History:         Patient has prior history of Echocardiogram examinations, most                  recent 03/09/2021. Acute MI, CAD and Previous Myocardial                  Infarction, Arrythmias:Tachycardia; Risk Factors:Hypertension,                  Diabetes and Dyslipidemia. Breast CA, COVID +.  Sonographer:     Wenda Low Referring Phys:  [id number] Marai Teehan D Maude Hettich Diagnosing Phys: Yolonda Kida MD IMPRESSIONS  1. Left ventricular ejection fraction, by estimation, is 30 to 35%. The left ventricle has moderately decreased function. The left ventricle demonstrates global hypokinesis. Left ventricular diastolic parameters are consistent with Grade II diastolic dysfunction (pseudonormalization).  2. Right ventricular systolic function is normal. The right ventricular size is normal. There is normal pulmonary artery systolic pressure.  3.  The mitral valve is normal in structure. Moderate mitral valve regurgitation.  4. The aortic valve is normal in structure. Aortic valve regurgitation is trivial. FINDINGS  Left Ventricle: Left ventricular ejection fraction, by estimation, is 30 to 35%. The left ventricle has moderately decreased function. The left ventricle demonstrates global hypokinesis. The left ventricular internal cavity size was normal in size. There is no left ventricular hypertrophy. Left ventricular diastolic parameters are consistent with Grade II diastolic dysfunction (pseudonormalization). Right Ventricle: The right ventricular size is normal. No increase in right ventricular wall thickness. Right ventricular systolic function is normal. There is normal pulmonary artery systolic pressure. The tricuspid regurgitant velocity is 2.50 m/s, and  with an assumed right atrial pressure of 8 mmHg, the estimated right ventricular systolic pressure is 123456 mmHg. Left Atrium: Left atrial size was normal in size. Right Atrium: Right atrial size was normal in size. Pericardium: There is no evidence of pericardial effusion. Mitral Valve: The mitral valve is normal in structure. Moderate mitral valve regurgitation. MV peak gradient, 3.6 mmHg. The mean mitral valve gradient is 1.0 mmHg. Tricuspid Valve: The tricuspid valve is normal in structure. Tricuspid valve regurgitation is trivial. Aortic Valve: The aortic valve is normal in structure. Aortic valve regurgitation is trivial. Aortic valve mean gradient measures 6.0 mmHg. Aortic valve peak gradient measures 10.2 mmHg. Aortic valve area, by VTI measures 1.55 cm. Pulmonic Valve: The pulmonic valve was normal in structure. Pulmonic valve regurgitation is not visualized. Aorta: The ascending aorta was not well visualized. IAS/Shunts: No atrial level shunt detected by color flow Doppler.  LEFT VENTRICLE PLAX 2D LVIDd:         3.80 cm   Diastology LVIDs:         3.15 cm   LV e' medial:    7.07 cm/s LV PW:          1.10 cm   LV E/e' medial:  13.5 LV IVS:  0.90 cm   LV e' lateral:   5.68 cm/s LVOT diam:     2.00 cm   LV E/e' lateral: 16.8 LV SV:         46 LV SV Index:   31 LVOT Area:     3.14 cm  RIGHT VENTRICLE RV Basal diam:  2.55 cm RV Mid diam:    2.80 cm RV S prime:     10.60 cm/s TAPSE (M-mode): 1.3 cm LEFT ATRIUM           Index        RIGHT ATRIUM           Index LA diam:      3.40 cm 2.34 cm/m   RA Area:     10.30 cm LA Vol (A2C): 40.2 ml 27.65 ml/m  RA Volume:   21.90 ml  15.07 ml/m LA Vol (A4C): 21.9 ml 15.07 ml/m  AORTIC VALVE                     PULMONIC VALVE AV Area (Vmax):    1.68 cm      PV Vmax:       1.15 m/s AV Area (Vmean):   1.55 cm      PV Peak grad:  5.3 mmHg AV Area (VTI):     1.55 cm AV Vmax:           160.00 cm/s AV Vmean:          113.000 cm/s AV VTI:            0.294 m AV Peak Grad:      10.2 mmHg AV Mean Grad:      6.0 mmHg LVOT Vmax:         85.40 cm/s LVOT Vmean:        55.800 cm/s LVOT VTI:          0.145 m LVOT/AV VTI ratio: 0.49  AORTA Ao Root diam: 3.00 cm MITRAL VALVE               TRICUSPID VALVE MV Area (PHT): 5.50 cm    TR Peak grad:   25.0 mmHg MV Area VTI:   2.33 cm    TR Vmax:        250.00 cm/s MV Peak grad:  3.6 mmHg MV Mean grad:  1.0 mmHg    SHUNTS MV Vmax:       0.95 m/s    Systemic VTI:  0.14 m MV Vmean:      49.0 cm/s   Systemic Diam: 2.00 cm MV Decel Time: 138 msec MV E velocity: 95.40 cm/s MV A velocity: 68.10 cm/s MV E/A ratio:  1.40 Britny Riel D Bryant Saye MD Electronically signed by Yolonda Kida MD Signature Date/Time: 01/04/2023/11:00:04 AM    Final      Echo moderately reduced left ventricular function EF around 30 to 35%  TELEMETRY: Sinus rhythm 85 nonspecific findings:  ASSESSMENT AND PLAN:  Principal Problem:   DKA (diabetic ketoacidosis) (HCC) Active Problems:   Sepsis ([place])   COVID-19 Hypertension Current hypotension   Plan History of DKA altered mental status resolved, adequate hydration Sepsis reasonably stable improved  continue current conservative management Relative hypotension continue adequate hydration Patient no been made comfort measures only We will sign off from cardiology standpoint   Yolonda Kida, MD, 01/05/2023 12:05 PM

## 2023-01-05 NOTE — Progress Notes (Signed)
PHARMACY CONSULT NOTE - FOLLOW UP  Pharmacy Consult for Electrolyte Monitoring and Replacement   Recent Labs: Potassium (mmol/L)  Date Value  01/05/2023 4.8  09/28/2014 4.4   Magnesium (mg/dL)  Date Value  01/05/2023 1.6 (L)   Calcium (mg/dL)  Date Value  01/05/2023 7.3 (L)   Calcium, Total (mg/dL)  Date Value  09/28/2014 9.0   Albumin (g/dL)  Date Value  12/27/2022 3.6  09/28/2014 3.8   Phosphorus (mg/dL)  Date Value  01/05/2023 3.5   Sodium (mmol/L)  Date Value  01/05/2023 132 (L)  09/28/2014 133 (L)     Assessment: 82 year old female admitted with DKA, sepsis, and ACS. In AKI with Scr 2.64 (Baseline Scr < 1). On IV insulin infusion at 3 units/hr. Anion gap initially 30 on admission.  Diet: NPO  Transitioned off IV insulin 2/16 evening  Goal of Therapy:  K > 4 while on IV insulin Mag > 2 All other electrolytes within normal limits  Plan:  K 4.8  Will change LR w/ 20 meq KCL @ 151m/hr to plain LR @ 104mhr for now Mag 1.6  Will order magnesium sulfate 2 gm IV x 1   watch w/ Scr 1.92   patient w/ persistent beats of VT F/u electrolytes w/ am labs   MeNoralee SpacePharmD Clinical Pharmacist 01/05/2023 8:00 AM

## 2023-01-05 NOTE — Progress Notes (Signed)
NAME:  [name] [name], MRN:  [id number], DOB:  October 13, 1941, LOS: 2 ADMISSION DATE:  01/02/2023, CONSULTATION DATE:  01/09/2023 REFERRING MD:  Dr. Jari Pigg, CHIEF COMPLAINT:  Altered Mental Status   History of Present Illness:  82 yo F presenting to [facility] ED from home via EMS on 12/21/2022 due to hyperglycemia and altered mental status. Husband met with me and shares she got abdominal pain in middle of night got up out of bed to check her blood glucose and it was >600.  She apparently is compliant with her medications.  Husband denies flu like illness sick contacts denies NVD or cough.  She was found to have COVID19+ in DKA.  She is oliguric but on room air. PCCM admission for DKA in shock requiring pressors.   ED course: Upon arrival patient remains altered meeting SIRS criteria with tachycardia, low grade fever, tachypnea & hypotension.  Medications given: acetaminophen 1g, cefepime/ flagyl/ vancomycin, 2 L  LR bolus, levophed drip started Initial Vitals: 100.5, 24, 111, 82/37 & 100% on RA Significant labs: (Labs/ Imaging personally reviewed) I, Domingo Pulse [place], AGACNP-BC, personally viewed and interpreted this ECG. EKG Interpretation: Date: 01/09/2023, EKG Time: 05:08, Rate: 109, Rhythm: ST, QRS Axis:  RAD, Intervals: normal, ST/T Wave abnormalities: diffuse ST depression and T wave inversions, Narrative Interpretation: ST with diffuse ST depression & T wave inversions concerning for ischemia  01/04/2023: gap has closed. Patient continues to be somnolent.  Pertinent  Medical History  Breast Cancer s/p R mastectomy (2011) HLD Hypothyroidism HTN T2DM Significant Hospital Events: Including procedures, antibiotic start and stop dates in addition to other pertinent events       Objective   Blood pressure 97/68, pulse (!) 106, temperature 98.2 F (36.8 C), resp. rate (!) 21, height 5' 4"$  (1.626 m), weight 55.4 kg, SpO2 95 %.        Intake/Output Summary (Last 24 hours) at 01/05/2023 0842 Last  data filed at 01/05/2023 0400 Gross per 24 hour  Intake 4531.7 ml  Output 235 ml  Net 4296.7 ml   Filed Weights   01/11/2023 0542 01/04/23 0400 01/05/23 0320  Weight: 44 kg 45.1 kg 55.4 kg    Examination: General: Adult NAD ager appropirate, criticallyill but not in distress acutely ill, lying in bed ion room airNAD HEENT: MM pink/moist, anicteric, atraumatic, neck supple Neuro: A&O x non verbal commands, PERRL GCS9 , MAE CV: s1s2 RRR, PVCs  on monitor, no r/m/g Pulm: Regular, non labored on room air , breath sounds mild rhonchi- GI: soft, non distended , non-tender, bs x 4 GU: foley not in place  Skin:  no rashes/lesions noted Extremities: warm/dry, pulses + 2 R/P, edema noted  Resolved Hospital Problem list     Assessment & Plan:  Diabetic ketoacidosis  PMHx: IDDM -patient with acidemia and shock -complinace issues: -Insulin pump function? - DKA protocol initiated - if pH < 7, consider sodium bicarbonate infusion - Aggressive IV hydration, when blood glucose falls below 250 add D5 to IV fluids - Insulin drip ordered per Endo tool protocol - Strict I/O's: alert provider if UOP < 0.5 mL/kg/hr - Q 4 BMP, closely monitor potassium, replace electrolytes PRN - monitor blood glucose every 1 hour, per Endo tool protocol - trend serum CO2/ AG/ Lactic, f/u A1C - Diabetes coordinator consult - transitioned to long acting insulin  Circulatory shock - possibly related to COVID19 vs DKA vs onther infection , septic workup is in process - elevated lactate continue to trend - MRSA  screen negative, continue CEFEPIME for - Blood cultures done  - UA with reflex ordered - no DVT on [place] - Supplemental oxygen as needed, to maintain SpO2 > 90% - f/u cultures, trend lactic/ PCT - Daily CBC, monitor WBC/ fever curve - IVF hydration as needed: LR bolus plus bicarb drip - Continue levophed and VP to maintain MAP> 65 - Strict I/O's: alert provider if UOP < 0.5 mL/kg/hr - prednisone 23m  daily - Lactic acidosis  VT - continues to have persistent beats of VT - amio bolus and gtt - continue to monitor on telemetry - continued elevated troponins which make me very concerned for ACS  Palliative  Ongoing GoC discussion with family - family meeting today    Best Practice (right click and "Reselect all SmartList Selections" daily)  Diet/type: NPO DVT prophylaxis: SCD GI prophylaxis: PPI Lines: Central line and Arterial Line Foley:  Yes, and it is still needed Code Status:  DNR Last date of multidisciplinary goals of care discussion [today]  Labs   CBC: Recent Labs  Lab 01/08/2023 0505 01/04/23 0459 01/05/23 0354  WBC 23.3* 26.3* 31.4*  NEUTROABS 15.8*  --   --   HGB 10.4* 11.3* 11.3*  HCT 35.5* 32.9* 32.8*  MCV 108.2* 93.5 94.8  PLT 240 182 10000000   Basic Metabolic Panel: Recent Labs  Lab 01/10/2023 0919 12/28/2022 1317 01/04/23 0459 01/04/23 0856 01/04/23 1212 01/04/23 1659 01/05/23 0354  NA 135   < > 133* 136 135 136 132*  K 4.2   < > 4.4 4.1 3.7 3.7 4.8  CL 97*   < > 95* 99 95* 97* 92*  CO2 15*   < > 27 28 28 29 28  $ GLUCOSE 891*   < > 305* 247* 205* 174* 284*  BUN 50*   < > 50* 49* 50* 46* 49*  CREATININE 2.68*   < > 2.25* 2.15* 2.11* 1.94* 1.92*  CALCIUM 7.9*   < > 7.8* 7.9* 7.8* 7.6* 7.3*  MG 2.0  --  1.7  --   --   --  1.6*  PHOS 7.1*  --  4.0  --   --   --  3.5   < > = values in this interval not displayed.   GFR: Estimated Creatinine Clearance: 19.2 mL/min (A) (by C-G formula based on SCr of 1.92 mg/dL (H)). Recent Labs  Lab 12/31/2022 0505 01/02/2023 0507 12/22/2022 0919 01/08/2023 1858 01/04/23 0000 01/04/23 0459 01/05/23 0354  WBC 23.3*  --   --   --   --  26.3* 31.4*  LATICACIDVEN  --    < > 8.5* 6.2* 4.9* 4.0*  --    < > = values in this interval not displayed.    Liver Function Tests: Recent Labs  Lab 01/15/2023 0505  AST 65*  ALT 32  ALKPHOS 36*  BILITOT 1.5*  PROT 5.9*  ALBUMIN 3.6   Recent Labs  Lab 12/22/2022 1730  01/04/2023 1750  LIPASE  --  147*  AMYLASE 284*  --    No results for input(s): "AMMONIA" in the last 168 hours.  ABG    Component Value Date/Time   HCO3 25.4 01/02/2023 1950   ACIDBASEDEF 0.8 12/24/2022 1950   O2SAT 28.8 01/05/2023 1950     Coagulation Profile: Recent Labs  Lab 12/22/2022 1402  INR 1.2    Cardiac Enzymes: No results for input(s): "CKTOTAL", "CKMB", "CKMBINDEX", "TROPONINI" in the last 168 hours.  HbA1C: Hgb A1c MFr Bld  Date/Time  Value Ref Range Status  12/26/2022 06:37 AM 6.9 (H) 4.8 - 5.6 % Final    Comment:    (NOTE) Pre diabetes:          5.7%-6.4%  Diabetes:              >6.4%  Glycemic control for   <7.0% adults with diabetes   03/09/2021 02:42 AM 9.0 (H) 4.8 - 5.6 % Final    Comment:    (NOTE) Pre diabetes:          5.7%-6.4%  Diabetes:              >6.4%  Glycemic control for   <7.0% adults with diabetes     CBG: Recent Labs  Lab 01/04/23 1754 01/04/23 1916 01/04/23 2139 01/04/23 2314 01/05/23 0319  GLUCAP 143* 138* 138* 172* 220*    Review of Systems:   UTA- patient somnolent, unable to participate in an interview at this time.  Past Medical History:  She,  has a past medical history of Breast cancer ([place]) (2011), Cancer ([place]) (2011), diabetes insulin dep, High cholesterol, Hypertension, Hypothyroidism, Osteoporosis, and Thyroid disease.   Surgical History:   Past Surgical History:  Procedure Laterality Date   BREAST BIOPSY Right 2011   positive   CATARACT EXTRACTION W/PHACO Right 06/13/2016   Procedure: CATARACT EXTRACTION PHACO AND INTRAOCULAR LENS PLACEMENT (IOC);  Surgeon: Estill Cotta, MD;  Location: [facility] ORS;  Service: Ophthalmology;  Laterality: Right;  [place] 03:07 AP% 27.4 CDE 93.21 fluid pack lot # [id number]   MASTECTOMY Right 2011   positive   TONSILLECTOMY     TOTAL ABDOMINAL HYSTERECTOMY       Social History:   reports that she has never smoked. She has never used smokeless tobacco. She reports  that she does not drink alcohol and does not use drugs.   Family History:  Her family history includes Heart disease in her father. There is no history of Breast cancer.   Allergies Allergies  Allergen Reactions   Lisinopril Rash and Other (See Comments)    Hyponatremia  Hyponatremia  Hyponatremia    Sulfa Antibiotics Other (See Comments)    possible possible Patient doesn't know    Sulfasalazine      Home Medications  Prior to Admission medications   Medication Sig Start Date End Date Taking? Authorizing Provider  Calcium Carbonate-Vitamin D 500-125 MG-UNIT TABS Take by mouth.    [provider]  denosumab (PROLIA) 60 MG/ML SOLN injection Inject 60 mg into the skin every 6 (six) months. 05/14/16   [provider]  enalapril (VASOTEC) 20 MG tablet TAKE 1 TABLET TWICE A DAY 08/25/18   [provider]  glucose blood (ONETOUCH VERIO) test strip  05/08/17   [provider]  insulin aspart (NOVOLOG) 100 UNIT/ML injection 3-10 Units. 12/11/19   [provider]  insulin glargine (LANTUS) 100 UNIT/ML injection Inject 0.04 mLs (4 Units total) into the skin daily. 03/15/21   Wyvonnia Dusky, MD  levothyroxine (SYNTHROID) 25 MCG tablet Take 1 tablet by mouth daily before breakfast. 07/27/21   [provider]  lovastatin (MEVACOR) 20 MG tablet Take 1 tablet by mouth daily. 10/11/21   [provider]  Multiple Vitamins-Minerals (MACUVITE EYE CARE) TABS Take 1 tablet by mouth 2 (two) times daily.    [provider]  vitamin B-12 (CYANOCOBALAMIN) 100 MCG tablet Take by mouth.    [provider]     Critical care provider statement:  Total critical care time: 70 minutes  Arcelia Jew MD [place]

## 2023-01-05 NOTE — Progress Notes (Signed)
Pharmacy Antibiotic Note  [name] [name] is a 82 y.o. female admitted on 01/15/2023 with sepsis and circulatory shock. PMH includes hypothyroid, HLD, breast cancer s/p mastectomy (2011), HTN, T2DM. Pharmacy has been consulted for cefepime dosing.  Plan: cefepime IV 2 grams every 24 hours  for Crcl 19.2 ml/min   Height: 5' 4"$  (162.6 cm) Weight: 55.4 kg (122 lb 2.2 oz) IBW/kg (Calculated) : 54.7  Temp (24hrs), Avg:98.2 F (36.8 C), Min:97.9 F (36.6 C), Max:98.8 F (37.1 C)  Recent Labs  Lab 12/28/2022 0505 01/16/2023 0507 01/10/2023 0919 12/29/2022 1317 01/16/2023 1858 01/10/2023 1950 01/04/23 0000 01/04/23 0055 01/04/23 0459 01/04/23 0856 01/04/23 1212 01/04/23 1659 01/05/23 0354  WBC 23.3*  --   --   --   --   --   --   --  26.3*  --   --   --  31.4*  CREATININE 2.90*  --  2.68*   < >  --    < >  --    < > 2.25* 2.15* 2.11* 1.94* 1.92*  LATICACIDVEN  --  7.5* 8.5*  --  6.2*  --  4.9*  --  4.0*  --   --   --   --    < > = values in this interval not displayed.     Estimated Creatinine Clearance: 19.2 mL/min (A) (by C-G formula based on SCr of 1.92 mg/dL (H)).    Allergies  Allergen Reactions   Lisinopril Rash and Other (See Comments)    Hyponatremia  Hyponatremia  Hyponatremia    Sulfa Antibiotics Other (See Comments)    possible possible Patient doesn't know    Sulfasalazine     Antimicrobials this admission: cefepime 2/16 >>   Dose adjustments this admission: N/a  Microbiology results: 2/15 BCx: NG 2d 2/15 UCx:NG 2/15 MRSA PCR: not detected 2/15 RPV: COVID+ 2/15 CXR: Stable from prior. No evidence of active disease.   Thank you for allowing pharmacy to be a part of this patient's care.  Chinita Greenland PharmD Clinical Pharmacist 01/05/2023

## 2023-01-05 NOTE — Progress Notes (Signed)
ANTICOAGULATION CONSULT NOTE - Initial Consult  Pharmacy Consult for IV heparin Indication: chest pain/ACS  Allergies  Allergen Reactions   Lisinopril Rash and Other (See Comments)    Hyponatremia  Hyponatremia  Hyponatremia    Sulfa Antibiotics Other (See Comments)    possible possible Patient doesn't know    Sulfasalazine     Patient Measurements: Height: 5' 4"$  (162.6 cm) Weight: 55.4 kg (122 lb 2.2 oz) IBW/kg (Calculated) : 54.7 Heparin Dosing Weight: 44 kg  Vital Signs: Temp: 98.4 F (36.9 C) (02/17 0415) Temp Source: Rectal (02/17 0400) BP: 98/63 (02/17 0415) Pulse Rate: 81 (02/17 0415)  Labs: Recent Labs    01/14/2023 0505 12/22/2022 0919 01/07/2023 1317 01/10/2023 1402 01/08/2023 1950 01/04/23 0459 01/04/23 0856 01/04/23 1212 01/04/23 1659 01/04/23 1948 01/05/23 0354  HGB 10.4*  --   --   --   --  11.3*  --   --   --   --  11.3*  HCT 35.5*  --   --   --   --  32.9*  --   --   --   --  32.8*  PLT 240  --   --   --   --  182  --   --   --   --  152  APTT  --   --   --  42*  --   --   --   --   --   --   --   LABPROT  --   --   --  15.2  --   --   --   --   --   --   --   INR  --   --   --  1.2  --   --   --   --   --   --   --   HEPARINUNFRC  --   --   --  <0.10*   < >  --  0.76*  --   --  0.58 0.60  CREATININE 2.90* 2.68*   < >  --    < > 2.25* 2.15* 2.11* 1.94*  --   --   TROPONINIHS 2,241* >24,000*  --   --   --   --   --   --   --   --   --    < > = values in this interval not displayed.     Estimated Creatinine Clearance: 19 mL/min (A) (by C-G formula based on SCr of 1.94 mg/dL (H)).   Medical History: Past Medical History:  Diagnosis Date   Breast cancer ([place]) 2011   RT MASTECTOMY   Cancer ([place]) 2011   BREAST CA   diabetes insulin dep    High cholesterol    Hypertension    Hypothyroidism    Osteoporosis    Thyroid disease     Medications:  Not on PTA anticoagulation per my chart review and review of fill history  Assessment: 82 year old  female admitted to CCU with sepsis, DKA, and elevated troponins. Pharmacy has been consulted to start IV heparin for ACS.   Baseline labs in process. H&H stable  Goal of Therapy:  Heparin level 0.3-0.7 units/ml Monitor platelets by anticoagulation protocol: Yes  2/16 1948 HL 0.58, therapeutic x 1   Plan:  2/17:  HL @ [id number] = 0.6, therapeutic X 2 Will continue pt on current rate and recheck HL on 2/18 with AM labs.   Decklin Weddington  D Clinical Pharmacist  01/05/2023 4:53 AM

## 2023-01-05 NOTE — Inpatient Diabetes Management (Signed)
Inpatient Diabetes Program Recommendations  AACE/ADA: New Consensus Statement on Inpatient Glycemic Control (2015)  Target Ranges:  Prepandial:   less than 140 mg/dL      Peak postprandial:   less than 180 mg/dL (1-2 hours)      Critically ill patients:  140 - 180 mg/dL   Lab Results  Component Value Date   GLUCAP 220 (H) 01/05/2023   HGBA1C 6.9 (H) 12/21/2022    Diabetes history: DM1 Outpatient Diabetes medications: Lantus 6 units q hs, Novolog 3-10 units tid meal coverage Current orders for Inpatient glycemic control: Semglee 2 units, Novolog correction 0-20 units q 4 hrs., Prednisone 50 mg qd  Inpatient Diabetes Program Recommendations:   Please consider: -Increase Semglee to 4 units qd -Decrease Novolog correction to 0-9 units q 4 hrs.  Thank you, Nani Gasser. Dantavious Snowball, RN, MSN, CDE  Diabetes Coordinator Inpatient Glycemic Control Team Team Pager [phone] (8am-5pm) 01/05/2023 10:30 AM

## 2023-01-05 NOTE — Progress Notes (Signed)
Had extensive discussion with husband and daughter. Also accompanied by primary nurse Andi Hence.   Family re-iterated that they would like the patient to be transferred to [facility] and remove life sustaining medications such as insulin and norepinephrine.   They understand that she is dying and unlikely to survive hospitalization.   Comfort measures only.   Arcelia Jew MD [facility]

## 2023-01-06 DIAGNOSIS — E1011 Type 1 diabetes mellitus with ketoacidosis with coma: Secondary | ICD-10-CM | POA: Diagnosis not present

## 2023-01-06 MED ORDER — MORPHINE 100MG IN NS 100ML (1MG/ML) PREMIX INFUSION
10.0000 mg/h | INTRAVENOUS | Status: DC
  Administered 2023-01-06: 10 mg/h via INTRAVENOUS
  Filled 2023-01-06: qty 100

## 2023-01-06 MED ORDER — LORAZEPAM 2 MG/ML IJ SOLN: 2.0000 mg | INTRAMUSCULAR | Status: DC | PRN

## 2023-01-06 MED ORDER — LORAZEPAM 2 MG/ML IJ SOLN
2.0000 mg | Freq: Once | INTRAMUSCULAR | Status: AC
  Administered 2023-01-06: 2 mg via INTRAVENOUS
  Filled 2023-01-06: qty 1

## 2023-01-08 LAB — CULTURE, BLOOD (ROUTINE X 2)
Culture: NO GROWTH
Culture: NO GROWTH
Special Requests: ADEQUATE

## 2023-01-17 ENCOUNTER — Ambulatory Visit: Payer: HMO | Admitting: Podiatry

## 2023-01-18 NOTE — Discharge Summary (Signed)
[facility]    Patient Name:  [name] [name]  MRN: [id number]  PCP: Tracie Harrier, MD  DOB:  [birth year]       Date of Admission:  01/05/2023  Date of Discharge:  01/08/2023      Attending Physician: Dr. Bronson Ing, MD         DISCHARGE DIAGNOSES: Principal Problem:   DKA (diabetic ketoacidosis) ([place]) Active Problems:   Sepsis ([place])   COVID-19    PROCEDURES PERFORMED:  ECHOCARDIOGRAM COMPLETE  Result Date: 01/04/2023    ECHOCARDIOGRAM REPORT   Patient Name:   [name] [name] Date of Exam: 01/04/2023 Medical Rec #:  [id number]    Height:       64.0 in Accession #:    [id number]   Weight:       99.4 lb Date of Birth:  01-Sep-1941    BSA:          1.454 m Patient Age:    82 years     BP:           99/60 mmHg Patient Gender: F            HR:           94 bpm. Exam Location:  [facility] Procedure: 2D Echo, Cardiac Doppler and Color Doppler Indications:     Myocardial infarct  History:         Patient has prior history of Echocardiogram examinations, most                  recent 03/09/2021. Acute MI, CAD and Previous Myocardial                  Infarction, Arrythmias:Tachycardia; Risk Factors:Hypertension,                  Diabetes and Dyslipidemia. Breast CA, COVID +.  Sonographer:     Wenda Low Referring Phys:  [id number] DWAYNE D CALLWOOD Diagnosing Phys: Yolonda Kida MD IMPRESSIONS  1. Left ventricular ejection fraction, by estimation, is 30 to 35%. The left ventricle has moderately decreased function. The left ventricle demonstrates global hypokinesis. Left ventricular diastolic parameters are consistent with Grade II diastolic dysfunction (pseudonormalization).  2. Right ventricular systolic function is normal. The right ventricular size is normal. There is normal pulmonary artery systolic pressure.  3. The mitral valve is normal in structure. Moderate mitral valve regurgitation.  4. The aortic valve is normal in structure. Aortic valve regurgitation is trivial. FINDINGS  Left Ventricle: Left  ventricular ejection fraction, by estimation, is 30 to 35%. The left ventricle has moderately decreased function. The left ventricle demonstrates global hypokinesis. The left ventricular internal cavity size was normal in size. There is no left ventricular hypertrophy. Left ventricular diastolic parameters are consistent with Grade II diastolic dysfunction (pseudonormalization). Right Ventricle: The right ventricular size is normal. No increase in right ventricular wall thickness. Right ventricular systolic function is normal. There is normal pulmonary artery systolic pressure. The tricuspid regurgitant velocity is 2.50 m/s, and  with an assumed right atrial pressure of 8 mmHg, the estimated right ventricular systolic pressure is 123456 mmHg. Left Atrium: Left atrial size was normal in size. Right Atrium: Right atrial size was normal in size. Pericardium: There is no evidence of pericardial effusion. Mitral Valve: The mitral valve is normal in structure. Moderate mitral valve regurgitation. MV peak gradient, 3.6 mmHg. The mean mitral valve gradient is 1.0 mmHg. Tricuspid Valve: The tricuspid valve is normal in structure.  Tricuspid valve regurgitation is trivial. Aortic Valve: The aortic valve is normal in structure. Aortic valve regurgitation is trivial. Aortic valve mean gradient measures 6.0 mmHg. Aortic valve peak gradient measures 10.2 mmHg. Aortic valve area, by VTI measures 1.55 cm. Pulmonic Valve: The pulmonic valve was normal in structure. Pulmonic valve regurgitation is not visualized. Aorta: The ascending aorta was not well visualized. IAS/Shunts: No atrial level shunt detected by color flow Doppler.  LEFT VENTRICLE PLAX 2D LVIDd:         3.80 cm   Diastology LVIDs:         3.15 cm   LV e' medial:    7.07 cm/s LV PW:         1.10 cm   LV E/e' medial:  13.5 LV IVS:        0.90 cm   LV e' lateral:   5.68 cm/s LVOT diam:     2.00 cm   LV E/e' lateral: 16.8 LV SV:         46 LV SV Index:   31 LVOT Area:      3.14 cm  RIGHT VENTRICLE RV Basal diam:  2.55 cm RV Mid diam:    2.80 cm RV S prime:     10.60 cm/s TAPSE (M-mode): 1.3 cm LEFT ATRIUM           Index        RIGHT ATRIUM           Index LA diam:      3.40 cm 2.34 cm/m   RA Area:     10.30 cm LA Vol (A2C): 40.2 ml 27.65 ml/m  RA Volume:   21.90 ml  15.07 ml/m LA Vol (A4C): 21.9 ml 15.07 ml/m  AORTIC VALVE                     PULMONIC VALVE AV Area (Vmax):    1.68 cm      PV Vmax:       1.15 m/s AV Area (Vmean):   1.55 cm      PV Peak grad:  5.3 mmHg AV Area (VTI):     1.55 cm AV Vmax:           160.00 cm/s AV Vmean:          113.000 cm/s AV VTI:            0.294 m AV Peak Grad:      10.2 mmHg AV Mean Grad:      6.0 mmHg LVOT Vmax:         85.40 cm/s LVOT Vmean:        55.800 cm/s LVOT VTI:          0.145 m LVOT/AV VTI ratio: 0.49  AORTA Ao Root diam: 3.00 cm MITRAL VALVE               TRICUSPID VALVE MV Area (PHT): 5.50 cm    TR Peak grad:   25.0 mmHg MV Area VTI:   2.33 cm    TR Vmax:        250.00 cm/s MV Peak grad:  3.6 mmHg MV Mean grad:  1.0 mmHg    SHUNTS MV Vmax:       0.95 m/s    Systemic VTI:  0.14 m MV Vmean:      49.0 cm/s   Systemic Diam: 2.00 cm MV Decel Time: 138 msec MV E velocity: 95.40 cm/s MV A  velocity: 68.10 cm/s MV E/A ratio:  1.40 Yolonda Kida MD Electronically signed by Yolonda Kida MD Signature Date/Time: 01/04/2023/11:00:04 AM    Final    US Venous Img Lower Bilateral (DVT)  Result Date: 01/08/2023 CLINICAL DATA:  Lower extremity swelling. EXAM: BILATERAL LOWER EXTREMITY VENOUS DOPPLER ULTRASOUND TECHNIQUE: Gray-scale sonography with compression, as well as color and duplex ultrasound, were performed to evaluate the deep venous system(s) from the level of the common femoral vein through the popliteal and proximal calf veins. COMPARISON:  None Available. FINDINGS: VENOUS Doppler demonstrates flow in the bilateral common femoral, superficial femoral, and popliteal and posterior tibial veins. Peroneal veins are not  visualized. Visualized portions of profunda femoral vein and great saphenous vein unremarkable. No filling defects to suggest acute DVT on grayscale or color Doppler imaging. Doppler waveforms show normal direction of venous flow. 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 OTHER None. Limitations: none IMPRESSION: No evidence of acute deep venous thrombosis is seen involving either lower extremity. 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 Electronically Signed   By: Marijo Conception M.D.   On: 12/28/2022 12:18   CT ABDOMEN PELVIS WO CONTRAST  Result Date: 01/02/2023 CLINICAL DATA:  Sepsis EXAM: CT ABDOMEN AND PELVIS WITHOUT CONTRAST TECHNIQUE: Multidetector CT imaging of the abdomen and pelvis was performed following the standard protocol without IV contrast. RADIATION DOSE REDUCTION: This exam was performed according to the departmental dose-optimization program which includes automated exposure control, adjustment of the mA and/or kV according to patient size and/or use of iterative reconstruction technique. COMPARISON:  None Available. FINDINGS: Lower chest: Peribronchial thickening in lower lobes. Mild basilar atelectasis. Hepatobiliary: Diffuse low-attenuation liver. Small volume non dependent pneumobilia in the LEFT hepatic lobe. Small volume ascites/peritoneal Pancreas: There is little intra-abdominal fat and small amount of intraperitoneal free fluid which limits evaluation the pancreas. No gross pancreatic abnormality. Spleen: Normal spleen Adrenals/urinary tract: Adrenal glands normal. Kidneys normal on noncontrast exam. No hydronephrosis. Ureters bladder unremarkable Stomach/Bowel: Stomach, duodenum small-bowel normal. No small bowel dilatation or obstruction. The colon and  rectosigmoid colon are normal. Vascular/Lymphatic: Abdominal aorta is normal caliber with atherosclerotic calcification. There is no retroperitoneal or periportal lymphadenopathy. No pelvic lymphadenopathy. Reproductive: Uterus and adnexa unremarkable. Other: No free fluid. Musculoskeletal: No aggressive osseous lesion. IMPRESSION: 1. Peribronchial thickening in the lower lobe suggest bronchitis 2. Small volume ascites/intraperitoneal free fluid. 3. Hepatic steatosis. 4. Pneumobilia in the LEFT hepatic lobe. Query prior sphincterotomy. 5. No bowel obstruction. 6.  Aortic Atherosclerosis (ICD10-I70.0). Electronically Signed   By: Suzy Bouchard M.D.   On: 01/13/2023 08:27   CT HEAD WO CONTRAST (5MM)  Result Date: 12/28/2022 CLINICAL DATA:  Hyperglycemia. EXAM: CT HEAD WITHOUT CONTRAST TECHNIQUE: Contiguous axial images were obtained from the base of the skull through the vertex without intravenous contrast. RADIATION DOSE REDUCTION: This exam was performed according to the departmental dose-optimization program which includes automated exposure control, adjustment of the mA and/or kV according to patient size and/or use of iterative reconstruction technique. COMPARISON:  Head CT 03/08/2021 FINDINGS: Brain: There is no acute intracranial hemorrhage, extra-axial fluid collection, or acute infarct There is background parenchymal volume loss with prominence of the ventricular system and extra-axial CSF spaces. Gray-white differentiation is preserved patchy hypodensity in the supratentorial white matter likely reflects sequela of underlying chronic small-vessel ischemic change The  pituitary and suprasellar region are normal. There is no mass lesion. There is no mass effect or midline shift. Vascular: There is calcification of the bilateral carotid siphons and vertebral arteries. Scattered locules of air in the right masticator space are likely related to IV access. Skull: Normal. Negative for fracture or focal  lesion. Sinuses/Orbits: The imaged paranasal sinuses are clear. A right lens implant is noted. The globes and orbits are otherwise unremarkable. Other: None. IMPRESSION: No acute intracranial pathology. Electronically Signed   By: Valetta Mole M.D.   On: 12/24/2022 08:08   DG Chest Portable 1 View  Result Date: 12/24/2022 CLINICAL DATA:  Sepsis EXAM: PORTABLE CHEST 1 VIEW COMPARISON:  03/08/2021 FINDINGS: Hazy density at the right apex is likely mediastinal structures accentuated by rotation, unchanged. There is no edema, consolidation, effusion, or pneumothorax. Normal heart size and aortic contours. IMPRESSION: Stable from prior.  No evidence of active disease. Electronically Signed   By: Jorje Guild M.D.   On: 12/25/2022 05:51       ADMISSION DATA: 82 yo F presenting to [facility] ED from home via EMS on 12/26/2022 due to hyperglycemia and altered mental status. Husband met with me and shares she got abdominal pain in middle of night got up out of bed to check her blood glucose and it was >600.  She apparently is compliant with her medications.  Husband denies flu like illness sick contacts denies NVD or cough.  She was found to have COVID19+ in DKA.  She is oliguric but on room air. PCCM admission for DKA in shock requiring pressors.      HOSPITAL COURSE:  Diabetic ketoacidosis  PMHx: IDDM -patient with acidemia and shock -complinace issues: -Insulin pump function? - DKA protocol initiated - if pH < 7, consider sodium bicarbonate infusion - Aggressive IV hydration, when blood glucose falls below 250 add D5 to IV fluids - Insulin drip ordered per Endo tool protocol - Strict I/O's: alert provider if UOP < 0.5 mL/kg/hr - Q 4 BMP, closely monitor potassium, replace electrolytes PRN - monitor blood glucose every 1 hour, per Endo tool protocol - trend serum CO2/ AG/ Lactic, f/u A1C - Diabetes coordinator consult - transitioned to long acting insulin   Circulatory shock - possibly related to  COVID19 vs DKA vs onther infection , septic workup is in process - elevated lactate continue to trend - MRSA screen negative, continue CEFEPIME for - Blood cultures done  - UA with reflex ordered - no DVT on [place] - Supplemental oxygen as needed, to maintain SpO2 > 90% - f/u cultures, trend lactic/ PCT - Daily CBC, monitor WBC/ fever curve - IVF hydration as needed: LR bolus plus bicarb drip - Continue levophed and VP to maintain MAP> 65 - Strict I/O's: alert provider if UOP < 0.5 mL/kg/hr - prednisone 80m daily - Lactic acidosis   VT - continues to have persistent beats of VT - amio bolus and gtt - continue to monitor on telemetry - continued elevated troponins which make me very concerned for ACS    Patient was DNR per husband's wishes. She unfortunately developed distributive shock likely secondary to respiratory failure, COVID pneumonia, and DKA which required an insulin gtt, fluids, and vasopressor support. Once her DKA resolved, she remained so acutely obtunded and unable to be weaned off pressors. She was obtunded and unable to be aroused. After conversation with daughter and husband, the plan was to transition to comfort measures only.   She passed away peacefully on  01/11/2023 at 1230pm.   May she rest in peace.  Jarvis Morgan [id number]    DISCHARGE DATA: Vital Signs: BP (!) 108/49 (BP Location: Right Arm)   Pulse (!) 101   Temp 98.7 F (37.1 C)   Resp (!) 24   Ht 5' 4"$  (1.626 m)   Wt 55.4 kg   LMP  (LMP Unknown)   SpO2 (!) 87%   BMI 20.96 kg/m   Labs: No results found for this or any previous visit (from the past 24 hour(s)).    Time Spent on Discharge: 35 min  Arcelia Jew MD [place]

## 2023-01-18 NOTE — Progress Notes (Signed)
I have seen and examined the patient.   She is somnolent with slightly labored breathing.   Coarse BS bilaterally.  Tachycardic.   Since we are pursuing comfort measures only, I am starting a morphine gtt. Also increasing frequency of ativan. Updated primary nurse and charge RN.   Arcelia Jew MD [facility]

## 2023-01-18 NOTE — Progress Notes (Signed)
Patient passed away peacefully at 12:30 pm, husband Avanell Shackleton notified, along with Nursing supervisor, Charge nurse and Arcelia Jew attending MD.  .

## 2023-01-18 NOTE — Death Summary Note (Signed)
Patient passed away peacefully at 1230pm.   May she rest in peace.   I called husband telephonically to notify him.   [name] Jew MD [facility]

## 2023-01-18 DEATH — deceased

## 2023-03-18 ENCOUNTER — Ambulatory Visit: Payer: PPO

## 2023-03-18 ENCOUNTER — Ambulatory Visit: Payer: PPO | Admitting: Oncology

## 2023-03-18 ENCOUNTER — Other Ambulatory Visit: Payer: PPO
